# Patient Record
Sex: Female | Born: 1935 | Race: White | Hispanic: No | Smoking: Never smoker
Health system: Southern US, Community
[De-identification: ages and names within clinical notes are randomized; demographics above are authoritative.]

## PROBLEM LIST (undated history)

## (undated) DIAGNOSIS — R569 Unspecified convulsions: Secondary | ICD-10-CM

## (undated) DIAGNOSIS — D369 Benign neoplasm, unspecified site: Secondary | ICD-10-CM

## (undated) DIAGNOSIS — R410 Disorientation, unspecified: Secondary | ICD-10-CM

## (undated) DIAGNOSIS — E119 Type 2 diabetes mellitus without complications: Secondary | ICD-10-CM

## (undated) DIAGNOSIS — J45909 Unspecified asthma, uncomplicated: Secondary | ICD-10-CM

## (undated) DIAGNOSIS — I10 Essential (primary) hypertension: Secondary | ICD-10-CM

## (undated) DIAGNOSIS — R110 Nausea: Secondary | ICD-10-CM

## (undated) DIAGNOSIS — I639 Cerebral infarction, unspecified: Secondary | ICD-10-CM

## (undated) DIAGNOSIS — E785 Hyperlipidemia, unspecified: Secondary | ICD-10-CM

## (undated) DIAGNOSIS — J449 Chronic obstructive pulmonary disease, unspecified: Secondary | ICD-10-CM

## (undated) DIAGNOSIS — K219 Gastro-esophageal reflux disease without esophagitis: Secondary | ICD-10-CM

## (undated) HISTORY — DX: Type 2 diabetes mellitus without complications: E11.9

## (undated) HISTORY — DX: Hyperlipidemia, unspecified: E78.5

## (undated) HISTORY — DX: Unspecified convulsions: R56.9

## (undated) HISTORY — PX: TONSILLECTOMY: SUR1361

## (undated) HISTORY — DX: Cerebral infarction, unspecified: I63.9

## (undated) HISTORY — DX: Chronic obstructive pulmonary disease, unspecified: J44.9

## (undated) HISTORY — DX: Gastro-esophageal reflux disease without esophagitis: K21.9

## (undated) HISTORY — PX: BREAST BIOPSY: SHX20

## (undated) HISTORY — DX: Benign neoplasm, unspecified site: D36.9

---

## 2002-07-30 ENCOUNTER — Ambulatory Visit (HOSPITAL_COMMUNITY): Admission: RE | Admit: 2002-07-30 | Discharge: 2002-07-30 | Payer: Self-pay | Admitting: Internal Medicine

## 2002-07-30 HISTORY — PX: COLONOSCOPY: SHX174

## 2004-11-14 ENCOUNTER — Ambulatory Visit: Payer: Self-pay | Admitting: Urgent Care

## 2005-11-07 ENCOUNTER — Other Ambulatory Visit: Admission: RE | Admit: 2005-11-07 | Discharge: 2005-11-07 | Payer: Self-pay | Admitting: Dermatology

## 2007-06-11 ENCOUNTER — Ambulatory Visit: Payer: Self-pay | Admitting: Cardiology

## 2007-06-18 ENCOUNTER — Encounter: Payer: Self-pay | Admitting: Cardiology

## 2007-06-23 ENCOUNTER — Ambulatory Visit: Payer: Self-pay | Admitting: Cardiology

## 2007-08-21 ENCOUNTER — Ambulatory Visit: Payer: Self-pay | Admitting: Cardiology

## 2007-08-27 ENCOUNTER — Ambulatory Visit: Payer: Self-pay | Admitting: Cardiology

## 2007-08-31 ENCOUNTER — Ambulatory Visit: Payer: Self-pay | Admitting: Internal Medicine

## 2007-08-31 ENCOUNTER — Inpatient Hospital Stay (HOSPITAL_BASED_OUTPATIENT_CLINIC_OR_DEPARTMENT_OTHER): Admission: RE | Admit: 2007-08-31 | Discharge: 2007-08-31 | Payer: Self-pay | Admitting: Internal Medicine

## 2007-09-09 ENCOUNTER — Encounter: Payer: Self-pay | Admitting: Cardiology

## 2007-09-17 ENCOUNTER — Ambulatory Visit: Payer: Self-pay | Admitting: Cardiology

## 2008-05-30 ENCOUNTER — Encounter: Payer: Self-pay | Admitting: Cardiology

## 2008-08-04 ENCOUNTER — Ambulatory Visit: Payer: Self-pay | Admitting: Cardiology

## 2009-05-31 ENCOUNTER — Encounter: Payer: Self-pay | Admitting: Cardiology

## 2009-08-11 DIAGNOSIS — K649 Unspecified hemorrhoids: Secondary | ICD-10-CM | POA: Insufficient documentation

## 2009-08-11 DIAGNOSIS — E785 Hyperlipidemia, unspecified: Secondary | ICD-10-CM

## 2009-08-14 ENCOUNTER — Ambulatory Visit: Payer: Self-pay | Admitting: Cardiology

## 2009-08-14 DIAGNOSIS — I472 Ventricular tachycardia: Secondary | ICD-10-CM

## 2009-08-14 DIAGNOSIS — I10 Essential (primary) hypertension: Secondary | ICD-10-CM | POA: Insufficient documentation

## 2009-08-14 DIAGNOSIS — K219 Gastro-esophageal reflux disease without esophagitis: Secondary | ICD-10-CM | POA: Insufficient documentation

## 2009-08-14 DIAGNOSIS — E119 Type 2 diabetes mellitus without complications: Secondary | ICD-10-CM | POA: Insufficient documentation

## 2009-08-14 DIAGNOSIS — Z8679 Personal history of other diseases of the circulatory system: Secondary | ICD-10-CM | POA: Insufficient documentation

## 2009-08-18 ENCOUNTER — Telehealth (INDEPENDENT_AMBULATORY_CARE_PROVIDER_SITE_OTHER): Payer: Self-pay | Admitting: *Deleted

## 2010-06-13 ENCOUNTER — Encounter: Payer: Self-pay | Admitting: Cardiology

## 2010-10-07 ENCOUNTER — Encounter: Payer: Self-pay | Admitting: Cardiology

## 2010-10-11 ENCOUNTER — Ambulatory Visit: Payer: Self-pay | Admitting: Cardiology

## 2011-01-01 NOTE — Assessment & Plan Note (Signed)
Summary: 1 YR FU   Visit Type:  Follow-up Primary Franklin Baumbach:  Dr. Sherril Croon   History of Present Illness: the patient is a pleasant 77 year old female with no significant history of coronary artery disease. She has normal LV function she does have congenital absence of the right kidney. The patient has significant hypertension. She is on multiple blood pressure medications however the addition of an ARB has not favorably affected her blood pressure. The patient denies any chest pain short of breath orthopnea PND. She does report occasional palpitations still in the evening. Just prior history of nonsustained ventricular tachycardia but in the setting of normal LV function and no significant coronary artery disease. patient's blood pressure medications were increased at the last visit._ Based on blood work in July. Glucose was 144 creatinine was normal. Triglycerides were 243, cholesterol 257 HDL is 34 LDL cholesterol is 174.CBC was within normal limits.vitamin D level was normal at 41.2Lydia Stations hurt legs. Even tried Crest or. Lipitor made legs hurt. Red yeast rice recommended.   Stopped beta blocker changed to amlodipine. Was concerned about COPD. Negative cardiac catherization.  Dry feeling at night.  Ecg wnl  Crest or 2.5 mg will try.  Preventive Screening-Counseling & Management  Alcohol-Tobacco     Smoking Status: never  Current Medications (verified): 1)  Aciphex 20 Mg Tbec (Rabeprazole Sodium) .... Take 1 Tablet By Mouth Once A Day 2)  Diovan Hct 320-12.5 Mg Tabs (Valsartan-Hydrochlorothiazide) .... Take 1 Tablet By Mouth Once A Day 3)  Multivitamins   Tabs (Multiple Vitamin) .... Take 1 Tablet By Mouth Once A Day 4)  Vitamin D 50,000iu .... Take One By Mouth  Every Other Week 5)  Fish Oil Double Strength 1200 Mg Caps (Omega-3 Fatty Acids) .... Take 1 Capsule By Mouth Two Times A Day 6)  Calcium Carbonate-Vitamin D 600-400 Mg-Unit Tabs (Calcium Carbonate-Vitamin D) .... Take 1  Tablet By Mouth Two Times A Day 7)  Metformin Hcl 500 Mg Tabs (Metformin Hcl) .... Take 2 Tablets Every Morning and 1 Tablet Every Evening. 8)  Amlodipine Besylate 10 Mg Tabs (Amlodipine Besylate) .... Take 1 Tablet By Mouth Once A Day 9)  Glimepiride 2 Mg Tabs (Glimepiride) .... Take 1/2-1 Tablet By Mouth Once A Day 10)  Hydromet 5-1.5 Mg/65ml Syrp (Hydrocodone-Homatropine) .... As Needed 11)  Crestor 5 Mg Tabs (Rosuvastatin Calcium) .... Take 1/2 Tab (2.5mg ) At Bedtime  Allergies (verified): No Known Drug Allergies  Comments:  Nurse/Medical Assistant: The patient's medication list and allergies were reviewed with the patient and were updated in the Medication and Allergy Lists.  Past History:  Past Medical History: Last updated: 08/11/2009 Significant for hyperlipidemia, intermittently  bleeding hemorrhoids, history of adenomatous polyp, biopsy of right breast  for benign disease, tonsillectomy.  Past Surgical History: Last updated: 08/11/2009  tonsillectomy.  Family History: Last updated: 08/11/2009 Mother succumbed to congestive heart failure.  Father died  of problems related to COPD.  One brother had alcohol related cirrhosis.  No  history of chronic GI or liver disease otherwise.  Social History: Last updated: 08/11/2009 The patient has been married for 50 years.  Has two  children.  She is retired from [employer].  She denies use of tobacco or alcohol  abuse.  Risk Factors: Smoking Status: never (10/11/2010)  Vital Signs:  Patient profile:   77 year old female Height:      66 inches Weight:      176 pounds BMI:     28.51 Pulse rate:  80 / minute BP sitting:   134 / 74  (left arm) Cuff size:   regular  Vitals Entered By: Carlye Grippe (October 11, 2010 1:35 PM)  Nutrition Counseling: Patient's BMI is greater than 25 and therefore counseled on weight management options.  Physical Exam  Additional Exam:  General: Well-developed, well-nourished in no  distress head: Normocephalic and atraumatic eyes PERRLA/EOMI intact, conjunctiva and lids normal nose: No deformity or lesions mouth normal dentition, normal posterior pharynx neck: Supple, no JVD.  No masses, thyromegaly or abnormal cervical nodes lungs: Normal breath sounds bilaterally without wheezing.  Normal percussion heart: regular rate and rhythm with normal S1 and S2, no S3 or S4.  PMI is normal.  No pathological murmurs abdomen: Normal bowel sounds, abdomen is soft and nontender without masses, organomegaly or hernias noted.  No hepatosplenomegaly musculoskeletal: Back normal, normal gait muscle strength and tone normal pulsus: Pulse is normal in all 4 extremities Extremities: No peripheral pitting edema neurologic: Alert and oriented x 3 skin: Intact without lesions or rashes cervical nodes: No significant adenopathy psychologic: Normal affect    Impression & Recommendations:  Problem # 1:  ESSENTIAL HYPERTENSION, BENIGN (ICD-401.1) blood pressure control. Continue with medical regimen. Beta blocker was changed to amlodipine. The following medications were removed from the medication list:    Diltiazem Hcl Er Beads 300 Mg Xr24h-cap (Diltiazem hcl er beads) .Marland Kitchen... Take one capsule by mouth daily    Cardizem Cd 300 Mg Xr24h-cap (Diltiazem hcl coated beads) .Marland Kitchen... Take 1 capsule by mouth once a day    Tekturna 300 Mg Tabs (Aliskiren fumarate) .Marland Kitchen... Take 1 tablet by mouth once a day Her updated medication list for this problem includes:    Diovan Hct 320-12.5 Mg Tabs (Valsartan-hydrochlorothiazide) .Marland Kitchen... Take 1 tablet by mouth once a day    Amlodipine Besylate 10 Mg Tabs (Amlodipine besylate) .Marland Kitchen... Take 1 tablet by mouth once a day  Orders: EKG w/ Interpretation (93000)  Problem # 2:  HYPERLIPIDEMIA (ICD-272.4) the patient will try to start Crestor a 2.5 mg at bedtime. Her updated medication list for this problem includes:    Crestor 5 Mg Tabs (Rosuvastatin calcium) .Marland Kitchen...  Take 1/2 tab (2.5mg ) at bedtime  Problem # 3:  DM (ICD-250.00) Assessment: Comment Only  Her updated medication list for this problem includes:    Diovan Hct 320-12.5 Mg Tabs (Valsartan-hydrochlorothiazide) .Marland Kitchen... Take 1 tablet by mouth once a day    Metformin Hcl 500 Mg Tabs (Metformin hcl) .Marland Kitchen... Take 2 tablets every morning and 1 tablet every evening.    Glimepiride 2 Mg Tabs (Glimepiride) .Marland Kitchen... Take 1/2-1 tablet by mouth once a day  Patient Instructions: 1)  Crestor 2.5mg  at bedtime 2)  Labs: cholesterol in 6 months, will send note in mail 3)  Follow up in  1 year Prescriptions: CRESTOR 5 MG TABS (ROSUVASTATIN CALCIUM) take 1/2 tab (2.5mg ) at bedtime  #15 x 11   Entered by:   Hoover Brunette, LPN   Authorized by:   Lewayne Bunting, MD, [facility]   Signed by:   Hoover Brunette, LPN on 65/78/4696   Method used:   Electronically to        [facility] [address] (retail)       [address]       [place], [place]  [address]       Ph: [phone] or [phone]       Fax: [phone]   RxID:  [id number]   Handout requested.

## 2011-03-22 ENCOUNTER — Encounter: Payer: Self-pay | Admitting: *Deleted

## 2011-03-22 ENCOUNTER — Other Ambulatory Visit: Payer: Self-pay | Admitting: *Deleted

## 2011-03-22 DIAGNOSIS — E785 Hyperlipidemia, unspecified: Secondary | ICD-10-CM

## 2011-03-28 ENCOUNTER — Encounter: Payer: Self-pay | Admitting: Cardiology

## 2011-04-16 NOTE — Assessment & Plan Note (Signed)
[facility]                          [facility] NOTE   Sarah Duarte, Sarah Duarte                        MRN:          [id number]  DATE:06/11/2007                            DOB:          11-16-1932    Primary care physician previously was Dr. Eliberto Ivory, now is Doreen Beam, MD.  Cardiologist will be Learta Codding, MD.   CHIEF COMPLAINT:  Palpitations.   HISTORY OF PRESENT ILLNESS:  Sarah Duarte is a 77 year old female patient  who was recently evaluated by Dr. Sherril Croon for palpitations.  She has no  prior history of coronary artery disease or arrhythmia.  Over the last  several months she has noticed palpitations mainly at night when she is  lying in bed.  She often hears her heart beat in her ear.  She also  often notes tachypalpitations.  When she has the palpitations, she feels  like her energy is decreased.  She denies any syncope or near-syncope  associated with her symptoms.  She denies any shortness of breath or  chest pain.  She denies any dyspnea on exertion, orthopnea, paroxysmal  nocturnal dyspnea or significant pedal edema.  She denies any exertional  chest heaviness or tightness, arm or jaw discomfort.  The palpitations  never occur with exertion.   PAST MEDICAL HISTORY:  1. Hypertension.  2. Diet-controlled diabetes mellitus.  3. Hyperlipidemia.      a.     She has been intolerant to multiple statins in the past.  4. Gastroesophageal reflux disease.  5. Degenerative joint disease.  6. Status post benign right breast lumpectomy x3.   MEDICATIONS:  1. Aciphex 20 mg daily.  2. Atenolol 50 mg half a tablet daily.  3. Norvasc 5 mg daily.  4. Citalopram daily.  5. Multivitamin daily.  6. Fish oil 1 g daily.  7. Calcium.  8. Vitamin D.  9. Neurontin p.r.n.  10.Hydromet p.r.n.  11.Darvocet p.r.n.   ALLERGIES:  No known drug allergies.  She does note that Pravachol  caused myalgias and Lipitor caused visual changes.   SOCIAL HISTORY:   She denies any history of tobacco use in the past.  Denies alcohol abuse.  She is married and has 2 children.  She is  retired from [employer].   Family history is insignificant for premature coronary artery disease.  Her mother died in her 66s of congestive heart failure.  She also had a  sister die about a week ago from congestive heart failure.  She thinks  both may have had myocardial infarctions in their 10s.  Her father did  have a history of stroke.   REVIEW OF SYSTEMS:  Please see HPI.  She does have a chronic cough times  several years with clear sputum.  She denies any hemoptysis.  She denies  any fevers, chills, melena, hematochezia, hematuria or dysuria.  Denies  any facial drooping, difficulty with speech, monocular blindness, or  unilateral weakness.  She denies any symptoms consistent with  claudication but she does describe symptoms that sound concerning for  restless legs syndrome.  The rest of the review  of systems is negative.   PHYSICAL EXAMINATION:  She is a well-developed, well-nourished female in  no distress.  Blood pressure 123/64, pulse 53, weight 168 pounds.  HEENT is normal.  NECK:  Without JVD, no lymphadenopathy.  Carotid without bruits  bilaterally.  ENDOCRINE:  Without thyromegaly.  CARDIAC:  Normal S1, S2.  Regular rate and rhythm without murmurs.  Lungs are clear to auscultation bilaterally without wheezes, rhonchi or  rales.  ABDOMEN:  Soft, nontender, nondistended, with normoactive bowel sounds.  No organomegaly.  EXTREMITIES:  Without edema.  Calves soft, nontender.  Skin is warm and dry.  NEUROLOGIC:  She is alert and oriented x3.  Cranial nerves II-XII are  grossly intact.   Electrocardiogram reveals sinus bradycardia with a heart rate of 58.  No  acute changes.  QTC 414 msec.   DATA BASE:  1. Echocardiogram performed at Dr. Bonnita Levan office is pending.  2. CardioNet event monitor:  The patient had two episodes of      nonsustained ventricular  tachycardia noted.  One was 6 beats and      one was 7 beats.  Apparently the patient was told by Dr. Bonnita Levan      office to go to the emergency room but she refused.   IMPRESSION:  1. Palpitations.  2. Nonsustained ventricular tachycardia.  3. Hypertension.  4. Untreated hyperlipidemia.      a.     Intolerant to multiple statins in the past.  5. Diet-controlled diabetes mellitus.  6. Gastroesophageal reflux disease.  7. Degenerative joint disease.  8. Remote family history of coronary artery disease.   PLAN:  The patient presents to the office today with complaints of  palpitations.  She thinks that they may have gotten a little bit worse  at night time.  She does have evidence of nonsustained ventricular  tachycardia by her CardioNet event monitor.  She denies any syncope or  near-syncope.  She denies any chest pain.  She does admit to quite a bit  of caffeine intake with drinking 2-3 bottles of green tea daily.  I have  asked her to cut back on her green tea consumption.  We will also try to  obtain the results of the 2 D echocardiogram from Dr. Bonnita Levan office.  We  will also set her up for a BMET and magnesium level.  Will also set her  up for a stress Cardiolite study to rule out the possibility of exercise-  induced ventricular tachycardia as well as to rule out ischemic heart  disease as a cause for her nonsustained ventricular tachycardia.  Her  monitor will be finished out and we will try to obtain the results from  Dr. Bonnita Levan office once that is complete.  I will have her follow up with  Dr. Andee Lineman in the next 2-3 weeks after the above testing is completed.  No medication changes were made today.      Tereso Newcomer, PA-C  Electronically Signed      Learta Codding, MD,FACC  Electronically Signed   SW/MedQ  DD: 06/11/2007  DT: 06/12/2007  Job #: [id number]   cc:   Doreen Beam

## 2011-04-16 NOTE — Assessment & Plan Note (Signed)
[facility]                          [facility] NOTE   Sarah Duarte, Sarah Duarte                        MRN:          [id number]  DATE:08/27/2007                            DOB:          Feb 06, 1932    HISTORY OF PRESENT ILLNESS:  Patient is a 77 year old female with a  history of palpitations.  The patient has worn at CardioNet monitor and  has been diagnosed with frequent episodes of 7-8 beats of nonsustained  ventricular tachycardia.  She has associated palpitations.  Patient was  ruled out for ischemic heart disease.  A stress test was ordered without  any evidence of exercise-induced ventricular arrhythmias.  There were no  perfusion abnormalities, and the ejection fraction was 73%.  Of note,  however, was that the patient had very poor exercise tolerance and was  not able to achieve 5 mets.  Echocardiographic study also does not  demonstrate structural abnormalities.  She does have mild left  ventricular hypertrophy as well as septal hypertrophy but no definite  outflow gradient.  Patient has been maintained on low dose atenolol.   Patient now presents for followup.  She has seen Scott in the office on  August 21, 2007.   MEDICATIONS:  1. Effexor 20 mg a day.  2. Atenolol 25 mg p.o. daily.  3. Citalopram.  4. Multivitamin.  5. Fish oil.  6. Vitamin D.  7. Garlic tablet.  8. Norvasc 10 mg p.o. daily.   PHYSICAL EXAMINATION:  VITAL SIGNS:  Blood pressure 143/74, heart rate  60.  She weighs 167 pounds.  NECK:  Normal carotid upstrokes.  No carotid bruits.  LUNGS:  Clear breath sounds bilaterally.  HEART:  Regular rate and rhythm.  Normal S1 and S2.  No murmurs, gallops  or rubs.  ABDOMEN:  Soft, nontender.  No rebound or guarding.  Good bowel sounds.  EXTREMITIES:  No clubbing, cyanosis or edema.   PROBLEMS:  1. Palpitations.  2. Nonsustained ventricular tachycardia.  3. Nonischemic stress Cardiolite study in July, 2008.  4.  Normal left ventricular function by an __________  study and by      echocardiogram in July, 2008.  5. Treated dyslipidemia, intolerant to multiple statins in the past.  6. Diet-controlled diabetes mellitus.  7. Gastroesophageal reflux disease.  8. Degenerative joint disease.  9. __________  coronary artery disease.  10.Fatigue.   PLAN:  1. Patient does not appear to have structural heart disease.      __________ .  She has ischemic heart disease.  However, she      continues to have symptomatic nonsustained ventricular tachycardia      and will proceed with a cardiac catheterization to definitely rule      out CAD.  2. I have increased the patient's atenolol 50 mg p.o. daily.  3. If the patient's catheterization is within normal limits and      patient continues to have symptomatic longstanding VT, we will      refer her to our electrophysiologist for a second opinion.     Learta Codding, MD,FACC  Electronically Signed  GED/MedQ  DD: 08/27/2007  DT: 08/28/2007  Job #: [id number]   cc:   Doreen Beam

## 2011-04-16 NOTE — Assessment & Plan Note (Signed)
[facility]                          [facility]   Sarah Duarte, Sarah Duarte                        MRN:          [id number]  DATE:08/04/2008                            DOB:          November 16, 1932    HISTORY OF PRESENT ILLNESS:  The patient is a 77 year old female with  history of palpitations, which have greatly improved on calcium-channel  blocker therapy.  She has no evidence of ischemic heart disease.  As a  matter of fact, cardiac catheterization last year was completely within  normal limits.  The patient has normal LV function.  She states that she  is doing well from a cardiac perspective, although she does complain of  generalized fatigue and weakness.  She has no chest pain, orthopnea,  PND, palpitations, or syncope.   MEDICATIONS:  1. Aciphex 20 mg p.o. daily.  2. Citalopram 10 mg p.o. daily.  3. Multivitamin.  4. Fish oil.  5. Calcium tablets.  6. Vitamin D5.  7. Diovan/hydrochlorothiazide 320/12.5 mg daily.  8. Diltiazem ER 240 mg q. p.m.  9. Metformin 500 mg q. a.m.   PHYSICAL EXAMINATION:  VITAL SIGNS:  Blood pressure is 150/69, heart  rate 64, and weight is 176 pounds.  NECK:  Normal carotid upstrokes.  No carotid bruits.  LUNGS:  Clear breath sounds bilaterally.  HEART:  Regular rate and rhythm.  Normal S1 and S2.  No murmurs, rubs,  or gallops.  ABDOMEN:  Soft, nontender.  No rebound or guarding.  Good bowel sounds.  EXTREMITIES:  No cyanosis, clubbing, or edema.   PROBLEM LIST:  1. Palpitations, resolved.  2. Nonsustained ventricular tachycardia.  3. __________.  4. Significant coronary artery disease by catheterization in 2008.  5. Normal left ventricular function by echocardiogram.  6. Gastroesophageal reflux disease.  7. Diabetes mellitus.  8. Dyslipidemia.  9. Fatigue.   PLAN:  1. From a cardiovascular standpoint, the patient is doing quite well.      She has no chest pain, shortness of breath, orthopnea,  or PND.  She      is essentially asymptomatic from a cardiac perspective.  I have      told her that her general fatigue is likely related to other      issues, and      she is going to discuss with her primary care physician.  2. The patient will have her Cardizem CD increased from 240 to 300 mg      daily because of persistent hypertension.     Learta Codding, MD,FACC  Electronically Signed    GED/MedQ  DD: 08/04/2008  DT: 08/05/2008  Job #: [id number]

## 2011-04-16 NOTE — Cardiovascular Report (Signed)
NAMEHIILANI, JETTER               ACCOUNT NO.:  [id number]   MEDICAL RECORD NO.:  [id number]          PATIENT TYPE:  OIB   LOCATION:  1961                         FACILITY:  [facility]   PHYSICIAN:  Bevelyn Buckles. Bensimhon, MDDATE OF BIRTH:  1932-04-19   DATE OF PROCEDURE:  08/31/2007  DATE OF DISCHARGE:                            CARDIAC CATHETERIZATION   PRIMARY CARE PHYSICIAN:  Dr. Doreen Beam in [place].   CARDIOLOGIST:  Dr Lewayne Bunting.   PATIENT IDENTIFICATION:  Ms. Rupard is a delightful 78 year old woman  with multiple cardiac risk factors, including hypertension,  hyperlipidemia, and diabetes.  She has recently been having  palpitations, and a monitor revealed nonsustained ventricular  tachycardia, several episodes.  She underwent an echocardiogram and a  Myoview which were normal.  However, she has had ongoing palpitations,  given her symptoms and nonsustained ventricular tachycardia.  She was  referred for cardiac catheterization in the outpatient catheterization  laboratory.   PROCEDURES PERFORMED:  1. Selective coronary angiography.  2. Left heart catheterization.  3. Left ventriculogram.  4. Abdominal aortogram.   DESCRIPTION OF PROCEDURE:  The risks and indications of the procedure  were explained.  Consent was signed and placed on the chart.  A 5-French  arterial sheath was placed in the right femoral artery using a modified  Seldinger technique.  Standard catheters were used, including a JL-4, JR-  4, and angled pigtail.  Given her significant systemic hypertension at  the start of the case, we decided also to perform an abdominal aortogram  which was done with a pigtail catheter.  All catheter exchanges were  made over a wire.  There are no apparent complications.  Central aortic  pressure is 156/54, with a mean of 90.  LV pressure is 158/1, with an  EDP of 12.  There is no aortic stenosis.   Left main was normal.   LAD was a long vessel, wrapping the apex.  It gave off  a large first  diagonal and a small second diagonal.  It was angiographically normal.   Left circumflex gave off a small ramus branch and a large OM1, as well  as an atrial branch.  It was angiographically normal.   Right coronary artery was a dominant vessel and gave off a small PDA and  four small posterolaterals.  It was angiographically normal.   Left ventriculogram done in the RAO position showed an EF of 60%, with  no wall motion abnormalities and no mitral regurgitation.   Abdominal aortogram showed mild distal plaquing of the abdominal aorta,  without any aneurysm.  The left renal was widely patent.  There was  evidence of congenital absence of the right kidney.   ASSESSMENT:  1. Normal coronary arteries.  2. Normal left ventricular function.  3. New finding of congenital absence of the right kidney.   PLAN:  Continue medical therapy, with aggressive risk factor reduction,  particularly with attention to her blood pressure, given her solitary  kidney.  I have discussed this finding with Dr. Andee Lineman.  We will also  plan to get a renal ultrasound to confirm.  Bevelyn Buckles. Bensimhon, MD  Electronically Signed     DRB/MEDQ  D:  08/31/2007  T:  08/31/2007  Job:  [id number]   cc:   Learta Codding, MD,FACC  Dhruv Sherril Croon

## 2011-04-16 NOTE — Assessment & Plan Note (Signed)
[facility]                          [facility] NOTE   Sarah Duarte, Sarah Duarte                        MRN:          [id number]  DATE:08/21/2007                            DOB:          May 07, 1932    CARDIOLOGIST:  Dr. Lewayne Bunting.   PRIMARY CARE PHYSICIAN:  Dr. Doreen Beam.   REASON FOR APPOINTMENT:  Followup visit.   HISTORY OF PRESENT ILLNESS:  Sarah Duarte is a 77 year old female patient  who returns to the office today for followup.  I initially saw her June 11, 2007 secondary to palpitations and 7 beats of nonsustained  ventricular tachycardia on a CardioNet event monitor, ordered by Dr.  Sherril Croon.  We ordered a stress test on Sarah Duarte to rule out ischemic heart  disease, as well as exercise-induced ventricular arrhythmias.  This  essentially returned normal with an EF of 73% and no ischemia.  She did  have poor exercise tolerance and was not able to achieve 5 METS.  Her  lab work was done, which revealed a creatinine of 0.9, potassium 4.1,  magnesium 2, TSH 1.14, normal CBC.  Unfortunately, we have not yet  received the rest of the strips from her CardioNet monitor from Dr.  Bonnita Levan office.  We have also not received her echocardiogram results  yet.   In the office today, the patient notes that she is doing well.  She  denies chest pain, shortness of breath, syncope, near-syncope,  orthopnea, PND, or pedal edema.  She continues to have palpitations.  These are usually in the morning times.  She denies that these awaken  her from sleep.  She does have some episodes of fatigue that remind her  of what she felt like when she was anemic as a child.   CURRENT MEDICATIONS:  1. AcipHex 20 mg daily.  2. Atenolol 50 mg 1/2 tablet daily.  3. Norvasc 5 mg daily.  4. Citalopram.  5. Multivitamin.  6. Fish oil.  7. Calcium.  8. Vitamin D.  9. Garlic.   PHYSICAL EXAM:  She is a well-nourished, well-developed female in no  acute distress.  Blood  pressure 149/71, pulse 59, weight 106.4 pounds.  HEENT:  Normal.  NECK:  Without JVD.  CARDIAC:  S1, S2.  Regular rate and rhythm.  LUNGS:  Clear to auscultation bilaterally.  ABDOMEN:  Soft and nontender.  EXTREMITIES:  Without edema.  Calves soft and nontender.  SKIN:  Warm and dry.  NEUROLOGIC:  She is alert and oriented x3.  Cranial nerves 2-12 are  grossly intact.   IMPRESSION:  1. Palpitations.  2. Nonsustained ventricular tachycardia.  3. Nonischemic stress Cardiolite study July 2008.  4. Good left ventricular function by nuclear study July 2008.  5. Untreated dyslipidemia.      a.     Intolerant to multiple Statins in the past.  6. Diet-controlled diabetes mellitus.  7. Gastroesophageal reflux disease.  8. Degenerative joint disease.  9. Remote family history of coronary artery disease.  10.Fatigue.   PLAN:  The patient presents to the office today for followup.  Overall,  she is doing well.  No further cardiovascular testing is warranted at  this time.  We will try to obtain the echo and monitor results from Dr.  Bonnita Levan office.  Our plan at this time is to:  1. Increase her Norvasc to 10 mg a day.  2. Obtain echo and monitor results from Dr. Bonnita Levan office.  3. Check a CBC secondary to her complaints of fatigue.  4. Return to the clinic in 6 months for followup.  She should return      sooner if she has any changes in her palpitations or worsening      symptoms.  Of note, she has sinus bradycardia and is fairly      asymptomatic from this.  Given her bradycardia, she has no room to      up-titrate her beta blocker at this point in time.      Tereso Newcomer, PA-C  Electronically Signed      Learta Codding, MD,FACC  Electronically Signed   SW/MedQ  DD: 08/21/2007  DT: 08/21/2007  Job #: [id number]   cc:   Doreen Beam

## 2011-04-16 NOTE — Assessment & Plan Note (Signed)
[facility]                          [facility] NOTE   Sarah Duarte, Sarah Duarte                        MRN:          [id number]  DATE:09/17/2007                            DOB:          04/21/1932    REFERRING PHYSICIAN:  Dr. Sherril Croon.   HISTORY OF PRESENT ILLNESS:  The patient is a 77 year old female with a  history of palpitations. She has worn a CardioNet monitor in the past  and has been diagnosed with frequent episodes of 7 to 8 beats of non-  sustained ventricular tachycardia. The patient was placed on beta-  blocker therapy, but has not experienced any change in her symptoms. As  part of the evaluation, we proceeded a catheterization to make sure the  patient did not have an underlying ischemic heart disease.  Catheterization was essentially normal and the patient had normal LV  function. She was incidentally found to have a single kidney. This was  confirmed by ultrasound ordered by Dr. Sherril Croon. Her chest x-ray also  revealed mild chronic obstructive pulmonary disease.   The patient says that overall she is doing well. She has no shortness of  breath, orthopnea, PND, or chest pain. She does report several occasions  of palpitations.   MEDICATIONS:  1. Aciphex 20 mg daily.  2. Citalopram 20 mg daily.  3. Multivitamin daily.  4. Fish oil 1000 mg daily.  5. Calcium b.i.d.  6. Vitamin D.  7. Garlic tablets 9528 mg b.i.d.  8. Norvasc 10 mg daily.  9. Atenolol 50 mg daily.   PHYSICAL EXAMINATION:  VITAL SIGNS:  Blood pressure 117/59, heart rate  50 beats-per-minute, weight 169 pounds.  NECK:  Normal carotid upstrokes. No carotid bruits.  LUNGS:  Clear breath sounds bilaterally.  HEART:  Regular rate and rhythm with normal S1 and S2. No murmurs, rubs,  or gallops.  ABDOMEN:  Soft and nontender. No rebound or guarding. Good bowel sounds.  EXTREMITIES:  No cyanosis, clubbing, or edema.  NEUROLOGIC:  The patient is alert, oriented, and  grossly non-focal.   PROBLEM LIST:  1. Palpitations.  2. Non-sustained ventricular tachycardia.  3. Non-ischemic Cardiolite study.  4. No significant coronary artery disease by catheterization.  5. Normal LV function by echo in July 2008.  6. Gastroesophageal reflux disease.  7. Diet controlled diabetes mellitus.  8. Treated dyslipidemia.  9. Fatigue.   PLAN:  1. At this point, we will stop the patient's Norvasc and we will try a      calcium channel blocker to see if this would help with the      patient's palpitations.  2. We will discontinue Atenolol.  3. At this point, the patient also does not need any further workup as      it appears that she has no significant underlying structural heart      disease. I did report to her also that her chest x-ray is abnormal      and I am also suspicious that she may well have significant      esophageal reflux disease. I asked her to double up her  Aciphex to      20 mg b.i.d.     Learta Codding, MD,FACC  Electronically Signed    GED/MedQ  DD: 09/18/2007  DT: 09/19/2007  Job #: [id number]   cc:   Doreen Beam

## 2011-04-19 NOTE — Op Note (Signed)
NAMEPARISSA, [name]                         ACCOUNT NO.:  [id number]   MEDICAL RECORD NO.:  [id number]                   PATIENT TYPE:  AMB   LOCATION:  DAY                                  FACILITY:  [facility]   PHYSICIAN:  Gerrit Friends. Rourk, M.D.               DATE OF BIRTH:  November 08, 1932   DATE OF PROCEDURE:  07/30/2002  DATE OF DISCHARGE:                                 OPERATIVE REPORT   PROCEDURE:  Screening colonoscopy.   ENDOSCOPIST:  Gerrit Friends. Rourk, M.D.   INDICATIONS FOR PROCEDURE:  The patient is a 77 year old lady with a history  of colonic adenomatous polyps.  She had what was at least an adenoma in her  left colon, cold biopsied at sigmoidoscopy previously.  She had a full  colonoscopy back in 2000.  This polyp was resected, but it was not  recovered.  She has not had any lower GI tract symptoms.  Colonoscopy is now  being done as a surveillance maneuver.  This approach has been discussed  with the patient; the potential risks, benefits, and alternatives have been  reviewed; questions answered.  I feel that she is low risk for conscious  sedation in the way of Versed and Demerol.  Please see my dictated H&P for  more information.   DESCRIPTION OF PROCEDURE:  O2 saturation, blood pressure, pulse and  respirations were monitored throughout the entire procedure.   CONSCIOUS SEDATION:  Versed 3 mg IV, Demerol 50 mg IV in divided doses.   INSTRUMENT:  Olympus video chip colonoscope.   FINDINGS:  Digital rectal exam revealed no abnormalities.   ENDOSCOPIC FINDINGS:  The prep was good.   RECTUM:  Examination of the rectal mucosa including the retroflex view of  the anal verge revealed no abnormalities.   COLON:  The colonic mucosa was surveyed from the rectosigmoid junction  through the left transverse and right colon to the area of the appendiceal  orifice, ileocecal valve, and cecum.  These structures were well seen and  photographed for the record.  The patient was  noted to have left-sided  transverse diverticulum; and the remainder of the colonic mucosa appeared  normal.   From the level of the cecum and ileocecal valve the scope was slowly  withdrawn.  All previously mentioned mucosal surfaces were again seen; and,  again, no other abnormalities were observed.  The patient tolerated the  procedure well and was reacted in endoscopy.   IMPRESSION:  1. Normal rectum.  2. Transverse left-sided diverticulum.  3. The remainder of the colonic mucosa appeared normal.   RECOMMENDATIONS:  1. Repeat colonoscopy in 5 years.  2. Diverticulosis literature provided to the patient.  Gerrit Friends. Rourk, M.D.    RMR/MEDQ  D:  07/30/2002  T:  07/31/2002  Job:  [id number]   cc:   Wyvonnia Lora

## 2011-04-19 NOTE — H&P (Signed)
[name], [name]                           ACCOUNT NO.:  [id number]   MEDICAL RECORD NO.:  [id number]                   PATIENT TYPE:   LOCATION:                                       FACILITY:   PHYSICIAN:  Gerrit Friends. Rourk, M.D.               DATE OF BIRTH:  05-26-1932   DATE OF ADMISSION:  07/05/2002  DATE OF DISCHARGE:                                HISTORY & PHYSICAL   CHIEF COMPLAINT:  History of adenomatous polyp removed from colon in 2000.   HISTORY OF PRESENT ILLNESS:  [name] is a pleasant 77 year old  lady who underwent a sigmoidoscopy which led to the discovery of an  adenomatous polyp in the left colon (biopsy proven) back in 2000.  She  ultimately underwent colonoscopy.  This polyp was resected.  However, it was  lost in the recovery process.  The pathology on the polyp was not known  entirely although it was cold biopsied at time of sigmoidoscopy which  revealed adenomatous tissue.  She has not had any rectal bleeding, change in  bowel habits recently.   She has longstanding GERD and laryngopharyngeal reflux for which she has  seen Dr. Ferd Glassing and Dr. Dion Saucier in [place], [place].  She has been on  Aciphex 20 mg orally b.i.d. which seems to be controlling her symptoms very  well.  She has had some visual problems which the pharmacist told her could  be secondary to Aciphex.  Nexium did not seem to work as well for this nice  lady.  She requested her liver labs and cholesterol checked today.   PAST MEDICAL HISTORY:  Significant for hyperlipidemia, intermittently  bleeding hemorrhoids, history of adenomatous polyp, biopsy of right breast  for benign disease, tonsillectomy.   CURRENT MEDICATIONS:  1. Ambien 10 mg at bedtime p.r.n.  2. Claritin 10 mg daily p.r.n.  3. Lipitor 20 mg daily.  4. Aciphex 20 mg orally b.i.d.   ALLERGIES:  No known drug allergies.   FAMILY HISTORY:  Mother succumbed to congestive heart failure.  Father died  of  problems related to COPD.  One brother had alcohol related cirrhosis.  No  history of chronic GI or liver disease otherwise.   SOCIAL HISTORY:  The patient has been married for 50 years.  Has two  children.  She is retired from [employer].  She denies use of tobacco or alcohol  abuse.   REVIEW OF SYSTEMS:  She denies chest pain, dyspnea on exertion, no fever or  chills.   PHYSICAL EXAMINATION:  GENERAL:  The patient is a pleasant 77 year old lady  resting comfortably.  VITAL SIGNS:  Blood pressure 160/60, pulse 78.  SKIN:  Warm and dry.  There is no jaundice.  No stigmata of GI or chronic  liver disease.  CHEST:  Lungs are clear to auscultation.  HEART:  Regular rate and rhythm without murmurs, rubs or gallops.  ABDOMEN:  Nondistended, positive bowel sounds, soft, nontender, no  appreciable mass, organomegaly.  RECTAL:  Deferred to the time of colonoscopy.   IMPRESSION:  The patient is a 77 year old lady with a history of adenomatous  polyp removed from her left colon.  It was previously cold biopsied which  revealed adenomatous tissue.  The entire polyp was not evaluated by  pathologist as it was lost in the recovery process.  In this setting she  presents for colonoscopy now to re-evaluate her entire colon.  This will be  surveillance colonoscopy.  Questions were answered.  She is agreeable to  this.  She is low risk for conscious sedation.  Of note she received Versed  2 mg IV, Demerol 25 mg IV for prior colonoscopy at [facility] and did  very well with that regimen.   She has some visual disturbances and the pharmacist has raised the question  of Aciphex related process.  Not mentioned above she did see Dr. Chales Abrahams down  in [place] who did not have any apparently strong feelings about  medication related phenomena and she was told that she had clinical dry  eyes.   Visual disturbance related to Aciphex must be a very unusual phenomenon.   RECOMMENDATIONS:  Will go ahead  and just ask her to hold Aciphex, will give  her a 30 day supply of Protonix samples 40 mg once a day 30 minutes before  breakfast.  Will plan for colonoscopy in the near future.  At her request  will go ahead and check a liver profile.  She has not fasted today.  Will  ask her to hold off getting a lipid profile.  She is to return for that lab  assay when she is in a fasting state.  Further recommendations to follow.                                                Gerrit Friends. Rourk, M.D.    RMR/MEDQ  D:  07/05/2002  T:  07/05/2002  Job:  [id number]   cc:   Knox Saliva, M.D.

## 2011-08-26 ENCOUNTER — Encounter: Payer: Self-pay | Admitting: Cardiovascular Disease

## 2011-11-12 ENCOUNTER — Encounter: Payer: Self-pay | Admitting: *Deleted

## 2011-11-15 ENCOUNTER — Ambulatory Visit (INDEPENDENT_AMBULATORY_CARE_PROVIDER_SITE_OTHER): Payer: Medicare Other | Admitting: Cardiology

## 2011-11-15 ENCOUNTER — Encounter: Payer: Self-pay | Admitting: Cardiology

## 2011-11-15 VITALS — BP 126/71 | HR 65 | Ht 66.0 in | Wt 172.0 lb

## 2011-11-15 DIAGNOSIS — I10 Essential (primary) hypertension: Secondary | ICD-10-CM

## 2011-11-15 DIAGNOSIS — Z8679 Personal history of other diseases of the circulatory system: Secondary | ICD-10-CM

## 2011-11-15 DIAGNOSIS — I472 Ventricular tachycardia, unspecified: Secondary | ICD-10-CM

## 2011-11-15 DIAGNOSIS — E785 Hyperlipidemia, unspecified: Secondary | ICD-10-CM

## 2011-11-15 NOTE — Patient Instructions (Signed)
Continue all current medications. Call back after first of year to inquire about new monitor (pending insurance) Your physician wants you to follow up in: 6 months.  You will receive a reminder letter in the mail one-two months in advance.  If you don't receive a letter, please call our office to schedule the follow up appointment

## 2011-11-16 NOTE — Assessment & Plan Note (Signed)
Hypertriglyceridemia but LDL is remarkably low at 37. The patient also has no significant coronary artery disease. No further adjustment of her Crestor as needed

## 2011-11-16 NOTE — Assessment & Plan Note (Signed)
This will be further reviewed with an event monitor but the patient has normal LV function and no coronary artery disease. We'll defer from giving her a beta blocker given her COPD and a clinical history of occasional wheezing.

## 2011-11-16 NOTE — Assessment & Plan Note (Signed)
I told the patient that we should further evaluate her for her palpitations. We will apply an event monitor. This can be done after the holidays. The main reason to further evaluate this to see if she has atrial fibrillation, because the patient has multiple risk factors with high risk for thromboembolic disease including female sex, age greater than 79 diabetes mellitus and hypertension. The patient is in agreement to further proceed with testing.

## 2011-11-16 NOTE — Progress Notes (Signed)
Sarah Bottoms, MD, [facility] Board Certified in Adult Cardiovascular Medicine,Internal Medicine and Critical Care Medicine    CC: Routine followup patient with history of nonsustained ventricular tachycardia and normal LV function  HPI:  The patient is a 77 year old female with no prior history of coronary artery disease. She has congenital absence of the right kidney. She has had problems with hypertension in the past. She recently had [facility] screening done. Apparently she had no aortic aneurysm or significant carotid artery disease. Her triglycerides were elevated at 369 with an HDL cholesterol of 40 and an LDL cholesterol 37. From a cardiac standpoint the patient reports no chest pain. She assured shortness of breath on exertion with wheezing which appears to be related to her COPD. Her main cardiovascular complaints his occasional palpitations with a sensation of jitteriness which occurs frequently at night. She does not report symptoms during the daytime. She has normal exercise tolerance and feels no palpitations upon activity. During the nighttime she does not appear to be panicky or anxious and there are no associated symptoms of diaphoresis or shortness of breath. The patient states that this happens a couple of times a week and last usually for a few minutes.     PMH: reviewed and listed in Problem List in Electronic Records (and see below) Past Medical History  Diagnosis Date  . Hyperlipidemia   . Hemorrhoids     intermittently,bleeding  . Adenomatous polyp     h/o   Past Surgical History  Procedure Date  . Breast biopsy     right,for benign disease  . Tonsillectomy   Status post cardiac catheterization with normal coronary arteries in 2007., Normal LV function    Allergies/SH/FHX : available in Electronic Records for review  Medications: Current Outpatient Prescriptions  Medication Sig Dispense Refill  . amLODipine (NORVASC) 10 MG tablet Take 10 mg by mouth  daily.        . calcium carbonate (OS-CAL) 600 MG TABS Take 600 mg by mouth 2 (two) times daily with a meal.        . ergocalciferol (VITAMIN D2) 50000 UNITS capsule Take 50,000 Units by mouth once a week.       Marland Kitchen glimepiride (AMARYL) 2 MG tablet Take 1 mg by mouth daily before breakfast.       . HYDROcodone-homatropine (HYCODAN) 5-1.5 MG/5ML syrup Take 5 mLs by mouth every 6 (six) hours as needed.        . metFORMIN (GLUCOPHAGE) 500 MG tablet Take 1,000 mg by mouth 2 (two) times daily with a meal.       . Multiple Vitamin (MULTIVITAMIN) tablet Take 1 tablet by mouth daily.        . Omega-3 Fatty Acids (FISH OIL) 1200 MG CAPS Take 1 capsule by mouth 2 (two) times daily.        . RABEprazole (ACIPHEX) 20 MG tablet Take 20 mg by mouth daily.        . rosuvastatin (CRESTOR) 5 MG tablet Take 2.5 mg by mouth daily.        . valsartan-hydrochlorothiazide (DIOVAN-HCT) 320-12.5 MG per tablet Take 1 tablet by mouth daily.          ROS: No nausea or vomiting. No fever or chills.No melena or hematochezia.No bleeding.No claudication.  Physical Exam: BP 126/71  Pulse 65  Ht 5\' 6"  (1.676 m)  Wt 172 lb (78.019 kg)  BMI 27.76 kg/m2 General: Well-nourished white female in no apparent distress Neck: Normal carotid upstroke  no carotid bruit. No thyromegaly nonnodular thyroid. JVD is approximately 6 cm Lungs: Clear breath sounds bilaterally no wheezing Cardiac: Regular rate and rhythm with normal S1-S2 and no murmur rubs or gallops. Vascular: No edema. Normal distal pulses. Skin: Warm and dry  12lead ECG: Not available Limited bedside ECHO:N/A Laboratory data from [facility] were reviewed and outlined above.   Assessment and Plan

## 2011-11-16 NOTE — Assessment & Plan Note (Signed)
Blood pressure appears to be now well controlled on medical regimen of a calcium channel blocker, ARB and diuretic. I have made no changes.

## 2012-06-02 ENCOUNTER — Ambulatory Visit (INDEPENDENT_AMBULATORY_CARE_PROVIDER_SITE_OTHER): Payer: Medicare Other | Admitting: Cardiology

## 2012-06-02 ENCOUNTER — Encounter: Payer: Self-pay | Admitting: Cardiology

## 2012-06-02 VITALS — BP 137/76 | HR 71 | Ht 66.0 in | Wt 166.0 lb

## 2012-06-02 DIAGNOSIS — R05 Cough: Secondary | ICD-10-CM

## 2012-06-02 DIAGNOSIS — I10 Essential (primary) hypertension: Secondary | ICD-10-CM

## 2012-06-02 DIAGNOSIS — I472 Ventricular tachycardia: Secondary | ICD-10-CM

## 2012-06-02 DIAGNOSIS — R053 Chronic cough: Secondary | ICD-10-CM | POA: Insufficient documentation

## 2012-06-02 DIAGNOSIS — Z8679 Personal history of other diseases of the circulatory system: Secondary | ICD-10-CM

## 2012-06-02 NOTE — Assessment & Plan Note (Signed)
No recurrent palpitations. No documented arrhythmias. The patient is stable

## 2012-06-02 NOTE — Progress Notes (Signed)
Sarah Bottoms, MD, [facility] Board Certified in Adult Cardiovascular Medicine,Internal Medicine and Critical Care Medicine    CC:     History of palpitations                                                                             HPI:        The patient has history of palpitations. For the most part these have resolved. Her main complaint is a chronic cough which has been present since 1976. She states recently has been worsening. I recommended for her to go see the ENT doctor. The patient still fatigued and the lack of energy most days of the week. She denies however any cardiac complaints in particular chest pain orthopnea or PND  PMH: reviewed and listed in Problem List in Electronic Records (and see below) Past Medical History  Diagnosis Date  . Hyperlipidemia   . Hemorrhoids     intermittently,bleeding  . Adenomatous polyp     h/o   Past Surgical History  Procedure Date  . Breast biopsy     right,for benign disease  . Tonsillectomy     Allergies/SH/FHX : available in Electronic Records for review  Allergies  Allergen Reactions  . Aspirin     Due to asthma   . Reglan (Metoclopramide) Other (See Comments)    Panic attacks   History   Social History  . Marital Status: Married    Spouse Name: N/A    Number of Children: N/A  . Years of Education: N/A   Occupational History  . Not on file.   Social History Main Topics  . Smoking status: Never Smoker   . Smokeless tobacco: Never Used  . Alcohol Use: No  . Drug Use: No  . Sexually Active: Not on file   Other Topics Concern  . Not on file   Social History Narrative   Has 2 children and has been married for 50 years. Retired from [employer].   Family History  Problem Relation Age of Onset  . Heart failure Mother   . COPD Father   . Cirrhosis Brother     alcohol related cirrhosis    Medications: Current Outpatient Prescriptions  Medication Sig Dispense Refill  . amLODipine (NORVASC) 10 MG tablet Take  10 mg by mouth daily.        . Calcium Carbonate-Vitamin D (CALTRATE 600+D) 600-400 MG-UNIT per tablet Take 1 tablet by mouth 2 (two) times daily.      . ergocalciferol (VITAMIN D2) 50000 UNITS capsule Take 50,000 Units by mouth every 14 (fourteen) days.       Marland Kitchen glimepiride (AMARYL) 2 MG tablet Take 1 mg by mouth daily before breakfast.       . metFORMIN (GLUCOPHAGE) 500 MG tablet Take 1,000 mg by mouth 2 (two) times daily with a meal.       . Multiple Vitamin (MULTIVITAMIN) tablet Take 1 tablet by mouth daily.        . Omega-3 Fatty Acids (FISH OIL) 1200 MG CAPS Take 1 capsule by mouth 2 (two) times daily.        . RABEprazole (ACIPHEX) 20 MG tablet Take 20 mg by mouth  daily.        . rosuvastatin (CRESTOR) 5 MG tablet Take 2.5 mg by mouth daily.        . SYMBICORT 80-4.5 MCG/ACT inhaler Inhale 2 puffs into the lungs Twice daily as needed.      . traMADol (ULTRAM) 50 MG tablet Take 1 tablet by mouth as needed.      . valsartan-hydrochlorothiazide (DIOVAN-HCT) 320-12.5 MG per tablet Take 1 tablet by mouth daily.          ROS: No nausea or vomiting. No fever or chills.No melena or hematochezia.No bleeding.No claudication  Physical Exam: BP 137/76  Pulse 71  Ht 5\' 6"  (1.676 m)  Wt 166 lb (75.297 kg)  BMI 26.79 kg/m2 General: Well-nourished white female in no distress Neck: Normal carotid upstroke no carotid bruits. No thyromegaly nonnodular thyroid Lungs: Clear breath sounds bilaterally no wheezing Cardiac regular rate and rhythm with normal S1-S2 no murmur rubs or gallops: Vascular: No edema. Normal distal pulses. Skin: Warm and dry Physcologic: Normal affect  12lead ECG: Not obtained Limited bedside ECHO:N/A No images are attached to the encounter.   I reviewed and summarized the old records. I reviewed ECG and prior blood work.  Assessment and Plan  VENTRICULAR TACHYCARDIA, PAROXYSMAL Patient reports no recurrent palpitations. No significant arrhythmias noted.  Chronic  cough Referred patient to ENT. Do not think is related to any of her cardiac medications.  ESSENTIAL HYPERTENSION, BENIGN Blood pressure well controlled. Continue medical regimen.  PALPITATIONS, HX OF No recurrent palpitations. No documented arrhythmias. The patient is stable    Patient Active Problem List  Diagnosis  . DM  . HYPERLIPIDEMIA  . ESSENTIAL HYPERTENSION, BENIGN  . VENTRICULAR TACHYCARDIA, PAROXYSMAL  . HEMORRHOIDS  . GERD  . PALPITATIONS, HX OF  . Chronic cough

## 2012-06-02 NOTE — Assessment & Plan Note (Signed)
Patient reports no recurrent palpitations. No significant arrhythmias noted.

## 2012-06-02 NOTE — Assessment & Plan Note (Addendum)
Referred patient to ENT. Do not think is related to any of her cardiac medications.

## 2012-06-02 NOTE — Patient Instructions (Signed)
Continue all current medications. Your physician wants you to follow up in:  1 year.  You will receive a reminder letter in the mail one-two months in advance.  If you don't receive a letter, please call our office to schedule the follow up appointment   

## 2012-06-02 NOTE — Assessment & Plan Note (Signed)
Blood pressure well controlled. Continue medical regimen.

## 2013-06-24 ENCOUNTER — Encounter: Payer: Self-pay | Admitting: Cardiovascular Disease

## 2013-07-14 ENCOUNTER — Encounter: Payer: Self-pay | Admitting: Cardiovascular Disease

## 2013-07-14 ENCOUNTER — Ambulatory Visit (INDEPENDENT_AMBULATORY_CARE_PROVIDER_SITE_OTHER): Payer: Medicare Other | Admitting: Cardiovascular Disease

## 2013-07-14 VITALS — BP 148/76 | HR 89 | Ht 66.0 in | Wt 169.0 lb

## 2013-07-14 DIAGNOSIS — I472 Ventricular tachycardia, unspecified: Secondary | ICD-10-CM

## 2013-07-14 DIAGNOSIS — R5383 Other fatigue: Secondary | ICD-10-CM

## 2013-07-14 DIAGNOSIS — R531 Weakness: Secondary | ICD-10-CM

## 2013-07-14 DIAGNOSIS — I10 Essential (primary) hypertension: Secondary | ICD-10-CM

## 2013-07-14 DIAGNOSIS — Z8679 Personal history of other diseases of the circulatory system: Secondary | ICD-10-CM

## 2013-07-14 DIAGNOSIS — R5381 Other malaise: Secondary | ICD-10-CM

## 2013-07-14 DIAGNOSIS — I8390 Asymptomatic varicose veins of unspecified lower extremity: Secondary | ICD-10-CM

## 2013-07-14 NOTE — Patient Instructions (Signed)
Continue all current medications. Your physician wants you to follow up in:  1 year.  You will receive a reminder letter in the mail one-two months in advance.  If you don't receive a letter, please call our office to schedule the follow up appointment   

## 2013-07-14 NOTE — Progress Notes (Signed)
Patient ID: Sarah Duarte, female   DOB: 1932-07-29, 79 y.o.   MRN: [id number]    SUBJECTIVE: Sarah Duarte has a h/o HTN, hyperlipidemia, COPD (by chest xray), and paroxysmal VT. An echo done in 2012 showed normal LV systolic function, EF 60-65%, mild LVH, and mild diastolic dysfunction. She told me she had a cardiac catheterization several years ago.  She feels weak all over. She informed Dr. Sherril Croon and a series of blood tests and xrays were done, and her xray showed COPD and DJD. Her TSH, renal function, hemoglobin, Vit D levels, and HbA1C were all normal.   Her TC was 161, TG 218, HDL 44, and LDL 73.  She denies chest pain and palpitations. Sh exercises three times a week. She also denies headaches. She does have leg fatigue.   Filed Vitals:   07/14/13 1350  BP: 148/76  Pulse: 89  Height: 5\' 6"  (1.676 m)  Weight: 169 lb (76.658 kg)     PHYSICAL EXAM General: NAD Neck: No JVD, no thyromegaly or thyroid nodule.  Lungs: Clear to auscultation bilaterally with normal respiratory effort. CV: Nondisplaced PMI.  Heart regular S1/S2, no S3/S4, no murmur.  No peripheral edema.  Leg varicosities noted. No carotid bruit.  Normal pedal pulses.  Abdomen: Soft, nontender, no hepatosplenomegaly, no distention.  Neurologic: Alert and oriented x 3.  Psych: Normal affect. Extremities: No clubbing or cyanosis.   ECG: normal sinus rhythm  LABS: Basic Metabolic Panel: No results found for this basename: NA, K, CL, CO2, GLUCOSE, BUN, CREATININE, CALCIUM, MG, PHOS,  in the last 72 hours Liver Function Tests: No results found for this basename: AST, ALT, ALKPHOS, BILITOT, PROT, ALBUMIN,  in the last 72 hours No results found for this basename: LIPASE, AMYLASE,  in the last 72 hours CBC: No results found for this basename: WBC, NEUTROABS, HGB, HCT, MCV, PLT,  in the last 72 hours Cardiac Enzymes: No results found for this basename: CKTOTAL, CKMB, CKMBINDEX, TROPONINI,  in the last 72 hours BNP: No  components found with this basename: POCBNP,  D-Dimer: No results found for this basename: DDIMER,  in the last 72 hours Hemoglobin A1C: No results found for this basename: HGBA1C,  in the last 72 hours Fasting Lipid Panel: No results found for this basename: CHOL, HDL, LDLCALC, TRIG, CHOLHDL, LDLDIRECT,  in the last 72 hours Thyroid Function Tests: No results found for this basename: TSH, T4TOTAL, FREET3, T3FREE, THYROIDAB,  in the last 72 hours Anemia Panel: No results found for this basename: VITAMINB12, FOLATE, FERRITIN, TIBC, IRON, RETICCTPCT,  in the last 72 hours  RADIOLOGY: No results found.    ASSESSMENT AND PLAN: 1. Paroxysmal VT: stable, no further recurrences with normal LV systolic function. 2. HTN: slightly elevated today, but will make no changes in meds. 3. Weakness: all blood tests are unremarkable, and it does not appear there is a cardiovascular etiology for this. Will defer to PCP for further workup. 4. Varicose veins: likely cause of her leg tightness. I encouraged the use of support hose.   Prentice Docker, M.D., F.A.C.C.

## 2013-10-25 ENCOUNTER — Ambulatory Visit: Payer: Medicare Other | Admitting: Gastroenterology

## 2014-07-14 ENCOUNTER — Ambulatory Visit: Payer: Medicare Other | Admitting: Cardiovascular Disease

## 2014-07-15 ENCOUNTER — Telehealth: Payer: Self-pay

## 2014-07-15 NOTE — Telephone Encounter (Addendum)
Pt was referred by Dr. Woody Seller for a screening colonoscopy. Her last one was 07/30/2002 by Dr. Gala Romney and her next was recommended in 5 years. Called pt and LMOM for a return call. She does need OV before scheduling a colonoscopy.

## 2014-07-19 ENCOUNTER — Encounter: Payer: Self-pay | Admitting: Cardiovascular Disease

## 2014-07-19 ENCOUNTER — Ambulatory Visit (INDEPENDENT_AMBULATORY_CARE_PROVIDER_SITE_OTHER): Payer: Medicare Other | Admitting: Cardiovascular Disease

## 2014-07-19 VITALS — BP 132/70 | HR 66 | Ht 66.0 in | Wt 168.0 lb

## 2014-07-19 DIAGNOSIS — I8390 Asymptomatic varicose veins of unspecified lower extremity: Secondary | ICD-10-CM

## 2014-07-19 DIAGNOSIS — K117 Disturbances of salivary secretion: Secondary | ICD-10-CM

## 2014-07-19 DIAGNOSIS — R05 Cough: Secondary | ICD-10-CM

## 2014-07-19 DIAGNOSIS — I4729 Other ventricular tachycardia: Secondary | ICD-10-CM

## 2014-07-19 DIAGNOSIS — R682 Dry mouth, unspecified: Secondary | ICD-10-CM

## 2014-07-19 DIAGNOSIS — M25476 Effusion, unspecified foot: Secondary | ICD-10-CM

## 2014-07-19 DIAGNOSIS — M25473 Effusion, unspecified ankle: Secondary | ICD-10-CM

## 2014-07-19 DIAGNOSIS — R059 Cough, unspecified: Secondary | ICD-10-CM

## 2014-07-19 DIAGNOSIS — R058 Other specified cough: Secondary | ICD-10-CM

## 2014-07-19 DIAGNOSIS — I472 Ventricular tachycardia, unspecified: Secondary | ICD-10-CM

## 2014-07-19 DIAGNOSIS — I10 Essential (primary) hypertension: Secondary | ICD-10-CM

## 2014-07-19 NOTE — Patient Instructions (Signed)
Continue all current medications. Your physician wants you to follow up in:  1 year.  You will receive a reminder letter in the mail one-two months in advance.  If you don't receive a letter, please call our office to schedule the follow up appointment   

## 2014-07-19 NOTE — Progress Notes (Addendum)
Patient ID: Sarah Duarte, female   DOB: Apr 25, 1932, 80 y.o.   MRN: [id number]      SUBJECTIVE: Sarah Duarte has a history of essential HTN, hyperlipidemia, COPD (by chest xray), and paroxysmal VT. An echo done in 2012 showed normal LV systolic function, EF 90-24%, mild LVH, and mild diastolic dysfunction. Cardiac catheterization on 08/31/07 demonstrated normal coronary arteries and congenital absence of the right kidney. She denies chest pain and shortness of breath. She occasionally has mild bilateral ankle swelling which he notices at the end of the day. She said she sometimes wakes up with a dry mouth and occasionally has a dry cough, but denies orthopnea and paroxysmal nocturnal dyspnea. She occasionally has a dry cough. She has several questions regarding her medications.   ECG performed in the office today demonstrates normal sinus rhythm with a heart rate of 71 beats per minute.   Review of Systems: As per "subjective", otherwise negative.  Allergies  Allergen Reactions  . Aspirin     Due to asthma   . Reglan [Metoclopramide] Other (See Comments)    Panic attacks    Current Outpatient Prescriptions  Medication Sig Dispense Refill  . amLODipine (NORVASC) 10 MG tablet Take 10 mg by mouth daily.        . Calcium Carbonate-Vitamin D (CALTRATE 600+D) 600-400 MG-UNIT per tablet Take 1 tablet by mouth 2 (two) times daily.      . ergocalciferol (VITAMIN D2) 50000 UNITS capsule Take 50,000 Units by mouth every 14 (fourteen) days.       Marland Kitchen glimepiride (AMARYL) 4 MG tablet Take 1 tablet by mouth 2 (two) times daily.      Marland Kitchen HYDROcodone-homatropine (HYCODAN) 5-1.5 MG/5ML syrup Take 5 mL by mouth every 6 (six) hours as needed for cough.      . metFORMIN (GLUCOPHAGE) 500 MG tablet Take 1,000 mg by mouth 2 (two) times daily with a meal.       . Multiple Vitamin (MULTIVITAMIN) tablet Take 1 tablet by mouth daily.        . pantoprazole (PROTONIX) 40 MG tablet Take 40 mg by mouth daily.       .  RABEprazole (ACIPHEX) 20 MG tablet Take 20 mg by mouth daily.        . rosuvastatin (CRESTOR) 5 MG tablet Take 2.5 mg by mouth daily.        . traMADol (ULTRAM) 50 MG tablet Take 1 tablet by mouth as needed.      . valsartan-hydrochlorothiazide (DIOVAN-HCT) 320-12.5 MG per tablet Take 1 tablet by mouth daily.         No current facility-administered medications for this visit.    Past Medical History  Diagnosis Date  . Hyperlipidemia   . Hemorrhoids     intermittently,bleeding  . Adenomatous polyp     h/o    Past Surgical History  Procedure Laterality Date  . Breast biopsy      right,for benign disease  . Tonsillectomy      History   Social History  . Marital Status: Married    Spouse Name: N/A    Number of Children: N/A  . Years of Education: N/A   Occupational History  . Not on file.   Social History Main Topics  . Smoking status: Never Smoker   . Smokeless tobacco: Never Used  . Alcohol Use: No  . Drug Use: No  . Sexual Activity: Not on file   Other Topics Concern  . Not on file  Social History Narrative   Has 2 children and has been married for 50 years.    Retired from [employer].     Filed Vitals:   07/19/14 1440  Height: 5\' 6"  (1.676 m)  Weight: 168 lb (76.204 kg)   BP 132/70  Pulse 66   PHYSICAL EXAM General: NAD HEENT: Normal. Neck: No JVD, no thyromegaly. Lungs: Clear to auscultation bilaterally with normal respiratory effort. CV: Nondisplaced PMI.  Regular rate and rhythm, normal S1/S2, no S3/S4, no murmur. No pretibial or periankle edema.  No carotid bruit.  Normal pedal pulses.  Abdomen: Soft, nontender, no hepatosplenomegaly, no distention.  Neurologic: Alert and oriented x 3.  Psych: Normal affect. Skin: Normal. Musculoskeletal: Normal range of motion, no gross deformities. Extremities: No clubbing or cyanosis.   ECG: Most recent ECG reviewed.      ASSESSMENT AND PLAN: 1. Paroxysmal VT: Stable, with no further recurrences and  normal LV systolic function with normal coronary arteries in 2008.  2. Essential HTN: Well controlled on current therapy. Will make no changes in meds.  3. Varicose veins with mild periankle swelling: Continued use of support hose. Antihypertensive therapy includes 12.5 mg HCTZ, which is sufficient. 4. Dry cough/mouth: I recommended she purchase a humidifier, as this should help to alleviate her symptoms. I do not feel she is dehydrated from such a low dose of diuretic to cause her to have a dry mouth.  Dispo: f/u 1 year.  Kate Sable, M.D., F.A.C.C.

## 2014-07-20 ENCOUNTER — Telehealth: Payer: Self-pay | Admitting: Internal Medicine

## 2014-07-20 NOTE — Telephone Encounter (Signed)
Pt's last colonoscopy per our records was 07/30/2002 by Dr. Gala Romney and the next was recommended in 5 years. She had adenomatous polyps.

## 2014-07-20 NOTE — Telephone Encounter (Signed)
Pt LMOM late yesterday to make OV. I called patient back and she is aware of OV on 9/17 at 11 with AS. Looking at her notes pt was referred by Dr Woody Seller and per DS patient needs OV prior to scheduling. Is there any records from Dr Woody Seller' office on this patient? This never made it to my in basket that patient needed OV.

## 2014-07-20 NOTE — Telephone Encounter (Signed)
So as long as we have the op note from her previous tcs we should be fine.  Sarah Duarte, did the patient mention of any new GI problems?

## 2014-07-20 NOTE — Telephone Encounter (Signed)
DS brought me the PCP referral. I have it up front with me

## 2014-08-17 ENCOUNTER — Telehealth: Payer: Self-pay

## 2014-08-17 NOTE — Telephone Encounter (Signed)
Per Sarah Duarte, I have LMOM for pt to call in regards to appt for tomorrow.

## 2014-08-18 ENCOUNTER — Ambulatory Visit (INDEPENDENT_AMBULATORY_CARE_PROVIDER_SITE_OTHER): Payer: Medicare Other | Admitting: Gastroenterology

## 2014-08-18 ENCOUNTER — Encounter: Payer: Self-pay | Admitting: Gastroenterology

## 2014-08-18 ENCOUNTER — Other Ambulatory Visit: Payer: Self-pay

## 2014-08-18 ENCOUNTER — Encounter (INDEPENDENT_AMBULATORY_CARE_PROVIDER_SITE_OTHER): Payer: Self-pay

## 2014-08-18 VITALS — BP 140/69 | HR 70 | Temp 97.4°F | Ht 64.0 in | Wt 165.0 lb

## 2014-08-18 DIAGNOSIS — D369 Benign neoplasm, unspecified site: Secondary | ICD-10-CM

## 2014-08-18 DIAGNOSIS — Z1211 Encounter for screening for malignant neoplasm of colon: Secondary | ICD-10-CM

## 2014-08-18 MED ORDER — PEG-KCL-NACL-NASULF-NA ASC-C 100 G PO SOLR: 1.0000 | ORAL | Status: DC

## 2014-08-18 NOTE — Progress Notes (Signed)
Primary Care Physician:  Glenda Chroman., MD Primary Gastroenterologist:  Dr. Gala Romney  Chief Complaint  Patient presents with  . Colonoscopy    HPI:   Sarah Duarte is a very pleasant 80 year old female presenting today with a history of adenomatous polyps in the remote past; her last lower GI evaluation was in 2003. She is quite healthy and appears younger than her stated age. She has no lower or upper GI symptoms of concern currently. No evidence of rectal bleeding, changes in bowel habits, or family history of colon cancer.    Past Medical History  Diagnosis Date  . Hyperlipidemia   . Hemorrhoids     intermittently,bleeding  . Adenomatous polyp     h/o  . COPD (chronic obstructive pulmonary disease)   . Diabetes   . GERD (gastroesophageal reflux disease)     Past Surgical History  Procedure Laterality Date  . Breast biopsy      right,for benign disease  . Tonsillectomy    . Colonoscopy  07/30/2002    WUJ:[id number] rectum/Transverse left-sided diverticulum/The remainder of the colonic mucosa appeared normal    Current Outpatient Prescriptions  Medication Sig Dispense Refill  . amLODipine (NORVASC) 10 MG tablet Take 10 mg by mouth daily.        . budesonide-formoterol (SYMBICORT) 80-4.5 MCG/ACT inhaler Inhale 1 puff into the lungs 2 (two) times daily.      . Calcium Carbonate-Vitamin D (CALTRATE 600+D) 600-400 MG-UNIT per tablet Take 1 tablet by mouth 2 (two) times daily.      . ergocalciferol (VITAMIN D2) 50000 UNITS capsule Take 50,000 Units by mouth every 14 (fourteen) days.       Marland Kitchen glimepiride (AMARYL) 4 MG tablet Take 1 tablet by mouth 2 (two) times daily.      Marland Kitchen HYDROcodone-acetaminophen (NORCO/VICODIN) 5-325 MG per tablet Take 1 tablet by mouth 3 (three) times daily as needed for moderate pain.      Marland Kitchen HYDROcodone-homatropine (HYCODAN) 5-1.5 MG/5ML syrup Take 5 mL by mouth every 6 (six) hours as needed for cough.      . metFORMIN (GLUCOPHAGE) 500 MG tablet Take 1,000  mg by mouth 2 (two) times daily with a meal.       . Multiple Vitamin (MULTIVITAMIN) tablet Take 1 tablet by mouth daily.        . Omega-3 300 MG CAPS Take 2 capsules by mouth daily.      . pantoprazole (PROTONIX) 40 MG tablet Take 40 mg by mouth daily.       . rosuvastatin (CRESTOR) 5 MG tablet Take 2.5 mg by mouth daily.        . valsartan-hydrochlorothiazide (DIOVAN-HCT) 320-12.5 MG per tablet Take 1 tablet by mouth daily.         No current facility-administered medications for this visit.    Allergies as of 08/18/2014 - Review Complete 08/18/2014  Allergen Reaction Noted  . Aspirin  06/02/2012  . Reglan [metoclopramide] Other (See Comments) 06/02/2012    Family History  Problem Relation Age of Onset  . Heart failure Mother   . COPD Father   . Cirrhosis Brother     alcohol related cirrhosis  . Colon cancer Neg Hx   . Lung cancer Brother     History   Social History  . Marital Status: Married    Spouse Name: N/A    Number of Children: N/A  . Years of Education: N/A   Occupational History  . Not on file.  Social History Main Topics  . Smoking status: Never Smoker   . Smokeless tobacco: Never Used  . Alcohol Use: No  . Drug Use: No  . Sexual Activity: Not on file   Other Topics Concern  . Not on file   Social History Narrative   Has 2 children and has been married for 50 years.    Retired from [employer].    Review of Systems: Gen: Denies any fever, chills, fatigue, weight loss, lack of appetite.  CV: Denies chest pain, heart palpitations, peripheral edema, syncope.  Resp: chronic cough  GI: see HPI GU : nocturnal frequency MS: occasional aches and pains, upper back pain Derm: Denies rash, itching, dry skin Psych: Denies depression, anxiety, memory loss, and confusion  Heme: Denies bruising, bleeding, and enlarged lymph nodes.  Physical Exam: BP 140/69  Pulse 70  Temp(Src) 97.4 F (36.3 C) (Oral)  Ht 5\' 4"  (1.626 m)  Wt 165 lb (74.844 kg)  BMI 28.31  kg/m2 General:   Alert and oriented. Pleasant and cooperative. Well-nourished and well-developed.  Head:  Normocephalic and atraumatic. Eyes:  Without icterus, sclera clear and conjunctiva pink.  Ears:  Normal auditory acuity. Nose:  No deformity, discharge,  or lesions. Mouth:  No deformity or lesions, oral mucosa pink.  Lungs:  Clear to auscultation bilaterally. No wheezes, rales, or rhonchi. No distress.  Heart:  S1, S2 present without murmurs appreciated.  Abdomen:  +BS, soft, non-tender and non-distended. No HSM noted. No guarding or rebound. No masses appreciated.  Rectal:  Deferred  Msk:  Symmetrical without gross deformities.  Extremities:  Without clubbing or edema. Neurologic:  Alert and  oriented x4;  grossly normal neurologically. Skin:  Intact without significant lesions or rashes. Psych:  Alert and cooperative. Normal mood and affect.

## 2014-08-18 NOTE — Patient Instructions (Signed)
We have scheduled you for a colonoscopy with Dr. Gala Romney in the near future.  The day before the procedure: take 1/2 dose of glimepiride and metformin at night.  The day OF the procedure: no diabetes medication.

## 2014-08-18 NOTE — Assessment & Plan Note (Signed)
80 year old female with history of adenomatous polyps in the remote past, with last colonoscopy in 2003. In good health overall, appearing younger than stated age. Appropriate for likely one more surveillance colonoscopy; she is devoid of any concerning lower GI or upper GI symptoms.   Proceed with TCS with Dr. Gala Romney in near future: the risks, benefits, and alternatives have been discussed with the patient in detail. The patient states understanding and desires to proceed.

## 2014-08-23 NOTE — Progress Notes (Signed)
cc'ed to pcp °

## 2014-08-30 ENCOUNTER — Encounter (HOSPITAL_COMMUNITY): Payer: Self-pay | Admitting: Pharmacy Technician

## 2014-09-14 ENCOUNTER — Encounter (HOSPITAL_COMMUNITY): Payer: Self-pay | Admitting: *Deleted

## 2014-09-14 ENCOUNTER — Ambulatory Visit (HOSPITAL_COMMUNITY)
Admission: RE | Admit: 2014-09-14 | Discharge: 2014-09-14 | Disposition: A | Discharge status: Home / Self Care | Payer: Medicare Other | Source: Ambulatory Visit | Attending: Internal Medicine | Admitting: Internal Medicine

## 2014-09-14 ENCOUNTER — Encounter (HOSPITAL_COMMUNITY)
Admission: RE | Disposition: A | Discharge status: Home / Self Care | Payer: Self-pay | Source: Ambulatory Visit | Attending: Internal Medicine

## 2014-09-14 DIAGNOSIS — Z860100 Personal history of colon polyps, unspecified: Secondary | ICD-10-CM

## 2014-09-14 DIAGNOSIS — K5730 Diverticulosis of large intestine without perforation or abscess without bleeding: Secondary | ICD-10-CM | POA: Diagnosis not present

## 2014-09-14 DIAGNOSIS — Z1211 Encounter for screening for malignant neoplasm of colon: Secondary | ICD-10-CM | POA: Diagnosis present

## 2014-09-14 DIAGNOSIS — E119 Type 2 diabetes mellitus without complications: Secondary | ICD-10-CM | POA: Insufficient documentation

## 2014-09-14 DIAGNOSIS — E785 Hyperlipidemia, unspecified: Secondary | ICD-10-CM | POA: Insufficient documentation

## 2014-09-14 DIAGNOSIS — J449 Chronic obstructive pulmonary disease, unspecified: Secondary | ICD-10-CM | POA: Diagnosis not present

## 2014-09-14 DIAGNOSIS — D122 Benign neoplasm of ascending colon: Secondary | ICD-10-CM

## 2014-09-14 DIAGNOSIS — K219 Gastro-esophageal reflux disease without esophagitis: Secondary | ICD-10-CM | POA: Diagnosis not present

## 2014-09-14 HISTORY — PX: COLONOSCOPY: SHX5424

## 2014-09-14 LAB — GLUCOSE, CAPILLARY: Glucose-Capillary: 223 mg/dL — ABNORMAL HIGH (ref 70–99)

## 2014-09-14 SURGERY — COLONOSCOPY
Anesthesia: Moderate Sedation

## 2014-09-14 MED ORDER — ONDANSETRON HCL 4 MG/2ML IJ SOLN
INTRAMUSCULAR | Status: AC
  Filled 2014-09-14: qty 2

## 2014-09-14 MED ORDER — MEPERIDINE HCL 100 MG/ML IJ SOLN
INTRAMUSCULAR | Status: AC
  Filled 2014-09-14: qty 2

## 2014-09-14 MED ORDER — ONDANSETRON HCL 4 MG/2ML IJ SOLN
INTRAMUSCULAR | Status: DC | PRN
Start: 2014-09-14 — End: 2014-09-14
  Administered 2014-09-14: 4 mg via INTRAVENOUS

## 2014-09-14 MED ORDER — MIDAZOLAM HCL 5 MG/5ML IJ SOLN
INTRAMUSCULAR | Status: AC
  Filled 2014-09-14: qty 10

## 2014-09-14 MED ORDER — MIDAZOLAM HCL 5 MG/5ML IJ SOLN
INTRAMUSCULAR | Status: DC | PRN
  Administered 2014-09-14: 2 mg via INTRAVENOUS
  Administered 2014-09-14 (×3): 1 mg via INTRAVENOUS

## 2014-09-14 MED ORDER — MEPERIDINE HCL 100 MG/ML IJ SOLN
INTRAMUSCULAR | Status: DC | PRN
  Administered 2014-09-14 (×3): 25 mg via INTRAVENOUS

## 2014-09-14 MED ORDER — SODIUM CHLORIDE 0.9 % IV SOLN
INTRAVENOUS | Status: DC
  Administered 2014-09-14: 10:00:00 via INTRAVENOUS

## 2014-09-14 MED ORDER — STERILE WATER FOR IRRIGATION IR SOLN
Status: DC | PRN
  Administered 2014-09-14: 11:00:00

## 2014-09-14 NOTE — Discharge Instructions (Signed)

## 2014-09-14 NOTE — Op Note (Signed)
[facility] [address] [place], [address]   COLONOSCOPY PROCEDURE REPORT  PATIENT: Quintasia, Theroux  MR#: [id number] BIRTHDATE: Sep 26, 1932 , 82  yrs. old GENDER: female ENDOSCOPIST: R.  Garfield Cornea, MD FACP [facility] REFERRED GG:YIRSW Woody Seller, M.D. PROCEDURE DATE:  09/17/14 PROCEDURE: INDICATIONS:average risk for colon cancer. MEDICATIONS: Versed 5 mg IV Demerol 75 mg in divided doses.  Zofran 4 mg IV ASA CLASS:       Class II  CONSENT: The risks, benefits, alternatives and imponderables including but not limited to bleeding, perforation as well as the possibility of a missed lesion have been reviewed.  The potential for biopsy, lesion removal, etc. have also been discussed. Questions have been answered.  All parties agreeable.  Please see the history and physical in the medical record for more information.  DESCRIPTION OF PROCEDURE:   After the risks benefits and alternatives of the procedure were thoroughly explained, informed consent was obtained.  The digital rectal exam revealed no abnormalities of the rectum.   The [id number] ([id number])  endoscope was introduced through the anus and advanced to the cecum, which was identified by both the appendix and ileocecal valve. No adverse events experienced.   The quality of the prep was     The instrument was then slowly withdrawn as the colon was fully examined.      COLON FINDINGS: Normal rectum.  Scattered left-sided diverticula. (1) diminutive polyp in the ascending segment area (2) 1 cm yellowish submucosal nodules in the ascending segment (positive pillow sign).  The remainder of the colonic mucosa appeared. Retroflexed views revealed no abnormalities. .  Withdrawal time=6 minutes 0 seconds.  The scope was withdrawn and the procedure completed. COMPLICATIONS: There were no immediate complications.  ENDOSCOPIC IMPRESSION: Colonic diverticulosis. Single colonic polyp-removed as described above. Colonic  lipoma  RECOMMENDATIONS: Followup on pathology.  eSigned:  R. Garfield Cornea, MD Rosalita Chessman [facility] 17-Sep-2014 11:29 AM   cc:  CPT CODES: ICD CODES:  The ICD and CPT codes recommended by this software are interpretations from the data that the clinical staff has captured with the software.  The verification of the translation of this report to the ICD and CPT codes and modifiers is the sole responsibility of the health care institution and practicing physician where this report was generated.  [employer] will not be held responsible for the validity of the ICD and CPT codes included on this report.  AMA assumes no liability for data contained or not contained herein. CPT is a Designer, television/film set of the [employer].  PATIENT NAME:  Lasandra, Batley MR#: [id number]

## 2014-09-14 NOTE — H&P (View-Only) (Signed)
Primary Care Physician:  Glenda Chroman., MD Primary Gastroenterologist:  Dr. Gala Romney  Chief Complaint  Patient presents with  . Colonoscopy    HPI:   Sarah Duarte is a very pleasant 80 year old female presenting today with a history of adenomatous polyps in the remote past; her last lower GI evaluation was in 2003. She is quite healthy and appears younger than her stated age. She has no lower or upper GI symptoms of concern currently. No evidence of rectal bleeding, changes in bowel habits, or family history of colon cancer.    Past Medical History  Diagnosis Date  . Hyperlipidemia   . Hemorrhoids     intermittently,bleeding  . Adenomatous polyp     h/o  . COPD (chronic obstructive pulmonary disease)   . Diabetes   . GERD (gastroesophageal reflux disease)     Past Surgical History  Procedure Laterality Date  . Breast biopsy      right,for benign disease  . Tonsillectomy    . Colonoscopy  07/30/2002    WHQ:[id number] rectum/Transverse left-sided diverticulum/The remainder of the colonic mucosa appeared normal    Current Outpatient Prescriptions  Medication Sig Dispense Refill  . amLODipine (NORVASC) 10 MG tablet Take 10 mg by mouth daily.        . budesonide-formoterol (SYMBICORT) 80-4.5 MCG/ACT inhaler Inhale 1 puff into the lungs 2 (two) times daily.      . Calcium Carbonate-Vitamin D (CALTRATE 600+D) 600-400 MG-UNIT per tablet Take 1 tablet by mouth 2 (two) times daily.      . ergocalciferol (VITAMIN D2) 50000 UNITS capsule Take 50,000 Units by mouth every 14 (fourteen) days.       Marland Kitchen glimepiride (AMARYL) 4 MG tablet Take 1 tablet by mouth 2 (two) times daily.      Marland Kitchen HYDROcodone-acetaminophen (NORCO/VICODIN) 5-325 MG per tablet Take 1 tablet by mouth 3 (three) times daily as needed for moderate pain.      Marland Kitchen HYDROcodone-homatropine (HYCODAN) 5-1.5 MG/5ML syrup Take 5 mL by mouth every 6 (six) hours as needed for cough.      . metFORMIN (GLUCOPHAGE) 500 MG tablet Take 1,000  mg by mouth 2 (two) times daily with a meal.       . Multiple Vitamin (MULTIVITAMIN) tablet Take 1 tablet by mouth daily.        . Omega-3 300 MG CAPS Take 2 capsules by mouth daily.      . pantoprazole (PROTONIX) 40 MG tablet Take 40 mg by mouth daily.       . rosuvastatin (CRESTOR) 5 MG tablet Take 2.5 mg by mouth daily.        . valsartan-hydrochlorothiazide (DIOVAN-HCT) 320-12.5 MG per tablet Take 1 tablet by mouth daily.         No current facility-administered medications for this visit.    Allergies as of 08/18/2014 - Review Complete 08/18/2014  Allergen Reaction Noted  . Aspirin  06/02/2012  . Reglan [metoclopramide] Other (See Comments) 06/02/2012    Family History  Problem Relation Age of Onset  . Heart failure Mother   . COPD Father   . Cirrhosis Brother     alcohol related cirrhosis  . Colon cancer Neg Hx   . Lung cancer Brother     History   Social History  . Marital Status: Married    Spouse Name: N/A    Number of Children: N/A  . Years of Education: N/A   Occupational History  . Not on file.  Social History Main Topics  . Smoking status: Never Smoker   . Smokeless tobacco: Never Used  . Alcohol Use: No  . Drug Use: No  . Sexual Activity: Not on file   Other Topics Concern  . Not on file   Social History Narrative   Has 2 children and has been married for 50 years.    Retired from [employer].    Review of Systems: Gen: Denies any fever, chills, fatigue, weight loss, lack of appetite.  CV: Denies chest pain, heart palpitations, peripheral edema, syncope.  Resp: chronic cough  GI: see HPI GU : nocturnal frequency MS: occasional aches and pains, upper back pain Derm: Denies rash, itching, dry skin Psych: Denies depression, anxiety, memory loss, and confusion  Heme: Denies bruising, bleeding, and enlarged lymph nodes.  Physical Exam: BP 140/69  Pulse 70  Temp(Src) 97.4 F (36.3 C) (Oral)  Ht 5\' 4"  (1.626 m)  Wt 165 lb (74.844 kg)  BMI 28.31  kg/m2 General:   Alert and oriented. Pleasant and cooperative. Well-nourished and well-developed.  Head:  Normocephalic and atraumatic. Eyes:  Without icterus, sclera clear and conjunctiva pink.  Ears:  Normal auditory acuity. Nose:  No deformity, discharge,  or lesions. Mouth:  No deformity or lesions, oral mucosa pink.  Lungs:  Clear to auscultation bilaterally. No wheezes, rales, or rhonchi. No distress.  Heart:  S1, S2 present without murmurs appreciated.  Abdomen:  +BS, soft, non-tender and non-distended. No HSM noted. No guarding or rebound. No masses appreciated.  Rectal:  Deferred  Msk:  Symmetrical without gross deformities.  Extremities:  Without clubbing or edema. Neurologic:  Alert and  oriented x4;  grossly normal neurologically. Skin:  Intact without significant lesions or rashes. Psych:  Alert and cooperative. Normal mood and affect.

## 2014-09-14 NOTE — Interval H&P Note (Signed)
History and Physical Interval Note:  09/14/2014 10:40 AM  Sarah Duarte  has presented today for surgery, with the diagnosis of Hx of adenomatous polyps  The various methods of treatment have been discussed with the patient and family. After consideration of risks, benefits and other options for treatment, the patient has consented to  Procedure(s) with comments: COLONOSCOPY (N/A) - 8:30 AM - moved to 9:30 - Ginger to notify pt as a surgical intervention .  The patient's history has been reviewed, patient examined, no change in status, stable for surgery.  I have reviewed the patient's chart and labs.  Questions were answered to the patient's satisfaction.    No change. Colonoscopy per plan.The risks, benefits, limitations, alternatives and imponderables have been reviewed with the patient. Questions have been answered. All parties are agreeable.   Manus Rudd

## 2014-09-15 ENCOUNTER — Encounter (HOSPITAL_COMMUNITY): Payer: Self-pay | Admitting: Internal Medicine

## 2014-09-19 ENCOUNTER — Encounter: Payer: Self-pay | Admitting: Internal Medicine

## 2015-07-21 ENCOUNTER — Encounter: Payer: Self-pay | Admitting: Cardiovascular Disease

## 2015-07-21 ENCOUNTER — Ambulatory Visit (INDEPENDENT_AMBULATORY_CARE_PROVIDER_SITE_OTHER): Payer: Medicare Other | Admitting: Cardiovascular Disease

## 2015-07-21 VITALS — BP 148/50 | HR 79 | Ht 65.0 in | Wt 169.0 lb

## 2015-07-21 DIAGNOSIS — I472 Ventricular tachycardia: Secondary | ICD-10-CM

## 2015-07-21 DIAGNOSIS — M25473 Effusion, unspecified ankle: Secondary | ICD-10-CM

## 2015-07-21 DIAGNOSIS — H8110 Benign paroxysmal vertigo, unspecified ear: Secondary | ICD-10-CM

## 2015-07-21 DIAGNOSIS — I868 Varicose veins of other specified sites: Secondary | ICD-10-CM | POA: Diagnosis not present

## 2015-07-21 DIAGNOSIS — M542 Cervicalgia: Secondary | ICD-10-CM

## 2015-07-21 DIAGNOSIS — I10 Essential (primary) hypertension: Secondary | ICD-10-CM | POA: Diagnosis not present

## 2015-07-21 DIAGNOSIS — I8390 Asymptomatic varicose veins of unspecified lower extremity: Secondary | ICD-10-CM

## 2015-07-21 DIAGNOSIS — I4729 Other ventricular tachycardia: Secondary | ICD-10-CM

## 2015-07-21 NOTE — Addendum Note (Signed)
Addended by: Julian Hy T on: 07/21/2015 11:06 AM   Modules accepted: Orders

## 2015-07-21 NOTE — Progress Notes (Signed)
Patient ID: Sarah Duarte, female   DOB: 09-21-1932, 81 y.o.   MRN: [id number]      SUBJECTIVE: Sarah Duarte has a history of essential HTN, hyperlipidemia, COPD (by chest xray), and paroxysmal VT. An echo done in 2012 showed normal LV systolic function, EF 13-24%, mild LVH, and mild diastolic dysfunction. Cardiac catheterization on 08/31/07 demonstrated normal coronary arteries and congenital absence of the right kidney. She denies chest pain, palpitations, and shortness of breath.  Her primary complaints relate to cervical spine pain and muscle spasm, as well as benign positional vertigo for which she has taken meclizine and received physical therapy in the past.  ECG today shows sinus rhythm with a RBBB.   Review of Systems: As per "subjective", otherwise negative.  Allergies  Allergen Reactions  . Aspirin     Due to asthma   . Reglan [Metoclopramide] Other (See Comments)    Panic attacks    Current Outpatient Prescriptions  Medication Sig Dispense Refill  . amLODipine (NORVASC) 10 MG tablet Take 10 mg by mouth daily.      . budesonide-formoterol (SYMBICORT) 80-4.5 MCG/ACT inhaler Inhale 1 puff into the lungs 2 (two) times daily.    . Calcium Carbonate-Vitamin D (CALTRATE 600+D) 600-400 MG-UNIT per tablet Take 1 tablet by mouth 2 (two) times daily.    . ergocalciferol (VITAMIN D2) 50000 UNITS capsule Take 50,000 Units by mouth every 14 (fourteen) days.     Marland Kitchen glimepiride (AMARYL) 4 MG tablet Take 1 tablet by mouth 2 (two) times daily.    Marland Kitchen HYDROcodone-acetaminophen (NORCO/VICODIN) 5-325 MG per tablet Take 1 tablet by mouth 3 (three) times daily as needed for moderate pain.    Marland Kitchen losartan-hydrochlorothiazide (HYZAAR) 100-12.5 MG per tablet Take 1 tablet by mouth daily.    . metFORMIN (GLUCOPHAGE) 500 MG tablet Take 1,000 mg by mouth 2 (two) times daily with a meal.     . Multiple Vitamin (MULTIVITAMIN) tablet Take 1 tablet by mouth daily.      . Omega-3 300 MG CAPS Take 2 capsules by  mouth daily.    . pantoprazole (PROTONIX) 40 MG tablet Take 40 mg by mouth daily.     . rosuvastatin (CRESTOR) 5 MG tablet Take 2.5 mg by mouth daily.       No current facility-administered medications for this visit.    Past Medical History  Diagnosis Date  . Hyperlipidemia   . Hemorrhoids     intermittently,bleeding  . Adenomatous polyp     h/o  . COPD (chronic obstructive pulmonary disease)   . Diabetes   . GERD (gastroesophageal reflux disease)     Past Surgical History  Procedure Laterality Date  . Breast biopsy      right,for benign disease  . Tonsillectomy    . Colonoscopy  07/30/2002    [id number] rectum/Transverse left-sided diverticulum/The remainder of the colonic mucosa appeared normal  . Colonoscopy N/A 09/14/2014    Procedure: COLONOSCOPY;  Surgeon: Daneil Dolin, MD;  [facility];  Service: Endoscopy;  Laterality: N/A;  8:30 AM - moved to 9:30 - Ginger to notify pt    Social History   Social History  . Marital Status: Married    Spouse Name: N/A  . Number of Children: N/A  . Years of Education: N/A   Occupational History  . Not on file.   Social History Main Topics  . Smoking status: Never Smoker   . Smokeless tobacco: Never Used  . Alcohol Use: No  .  Drug Use: No  . Sexual Activity: Not on file   Other Topics Concern  . Not on file   Social History Narrative   Has 2 children and has been married for 50 years.    Retired from [employer].     Filed Vitals:   07/21/15 0942  BP: 148/50  Pulse: 79  Height: 5\' 5"  (1.651 m)  Weight: 169 lb (76.658 kg)  SpO2: 97%    PHYSICAL EXAM General: NAD HEENT: Normal. Neck: No JVD, no thyromegaly. Lungs: Clear to auscultation bilaterally with normal respiratory effort. CV: Nondisplaced PMI.  Regular rate and rhythm, normal S1/S2, no S3/S4, no murmur. No pretibial or periankle edema.  No carotid bruit.  Normal pedal pulses.  Abdomen: Soft, nontender, no hepatosplenomegaly, no distention.    Neurologic: Alert and oriented x 3.  Psych: Normal affect. Skin: Normal. Musculoskeletal: Normal range of motion, no gross deformities. Extremities: No clubbing or cyanosis.   ECG: Most recent ECG reviewed.      ASSESSMENT AND PLAN: 1. Paroxysmal VT: Symptomatically stable, with no further recurrences and normal LV systolic function with normal coronary arteries in 2008.   2. Essential HTN: Reasonably controlled for age on current therapy. Will make no changes in meds.   3. Varicose veins with mild periankle swelling: Continued use of support hose.   4. Benign positional vertigo: Recommended prn use of meclizine if she is able to tolerate, as well as physical therapy.  5. Cervical spine pain: Recommended heating pad.  Dispo: f/u 1 year.  Kate Sable, M.D., F.A.C.C.

## 2015-07-21 NOTE — Patient Instructions (Signed)
Continue all current medications. Your physician wants you to follow up in:  1 year.  You will receive a reminder letter in the mail one-two months in advance.  If you don't receive a letter, please call our office to schedule the follow up appointment   

## 2016-09-04 ENCOUNTER — Encounter: Payer: Self-pay | Admitting: *Deleted

## 2016-09-05 ENCOUNTER — Encounter: Payer: Self-pay | Admitting: Cardiovascular Disease

## 2016-09-05 ENCOUNTER — Ambulatory Visit (INDEPENDENT_AMBULATORY_CARE_PROVIDER_SITE_OTHER): Payer: Medicare Other | Admitting: Cardiovascular Disease

## 2016-09-05 VITALS — BP 138/60 | HR 74 | Ht 65.0 in | Wt 168.0 lb

## 2016-09-05 DIAGNOSIS — R0609 Other forms of dyspnea: Secondary | ICD-10-CM | POA: Diagnosis not present

## 2016-09-05 DIAGNOSIS — I472 Ventricular tachycardia: Secondary | ICD-10-CM | POA: Diagnosis not present

## 2016-09-05 DIAGNOSIS — I10 Essential (primary) hypertension: Secondary | ICD-10-CM

## 2016-09-05 DIAGNOSIS — R002 Palpitations: Secondary | ICD-10-CM

## 2016-09-05 DIAGNOSIS — Z8679 Personal history of other diseases of the circulatory system: Secondary | ICD-10-CM | POA: Diagnosis not present

## 2016-09-05 DIAGNOSIS — I4510 Unspecified right bundle-branch block: Secondary | ICD-10-CM

## 2016-09-05 DIAGNOSIS — I4729 Other ventricular tachycardia: Secondary | ICD-10-CM

## 2016-09-05 NOTE — Patient Instructions (Signed)

## 2016-09-05 NOTE — Progress Notes (Signed)
SUBJECTIVE: Sarah Duarte has a history of essential HTN, hyperlipidemia, COPD (by chest xray), and paroxysmal VT.   An echo done in 2012 showed normal LV systolic function, EF 123456, mild LVH, and mild diastolic dysfunction.   Cardiac catheterization on 08/31/07 demonstrated normal coronary arteries and congenital absence of the right kidney.  She denies chest pain. She has exertional shortness of breath related to COPD. She denies palpitations or any symptoms similar to VT, but has an occasional "jittery feeling" in her chest.  ECG today shows sinus rhythm with a RBBB (no changes from last year).   Review of Systems: As per "subjective", otherwise negative.  Allergies  Allergen Reactions  . Aspirin     Due to asthma   . Reglan [Metoclopramide] Other (See Comments)    Panic attacks    Current Outpatient Prescriptions  Medication Sig Dispense Refill  . amLODipine (NORVASC) 10 MG tablet Take 10 mg by mouth daily.      . budesonide-formoterol (SYMBICORT) 80-4.5 MCG/ACT inhaler Inhale 1 puff into the lungs 2 (two) times daily.    . Calcium Carbonate-Vitamin D (CALTRATE 600+D) 600-400 MG-UNIT per tablet Take 1 tablet by mouth 2 (two) times daily.    . ergocalciferol (VITAMIN D2) 50000 UNITS capsule Take 50,000 Units by mouth every 14 (fourteen) days.     Marland Kitchen glimepiride (AMARYL) 4 MG tablet Take 1 tablet by mouth 2 (two) times daily.    Marland Kitchen HYDROcodone-acetaminophen (NORCO/VICODIN) 5-325 MG per tablet Take 1 tablet by mouth 3 (three) times daily as needed for moderate pain.    Marland Kitchen losartan-hydrochlorothiazide (HYZAAR) 100-12.5 MG per tablet Take 1 tablet by mouth daily.    . metFORMIN (GLUCOPHAGE) 500 MG tablet Take 1,000 mg by mouth 2 (two) times daily with a meal.     . Multiple Vitamin (MULTIVITAMIN) tablet Take 1 tablet by mouth daily.      . mupirocin cream (BACTROBAN) 2 % Apply 1 application topically 3 (three) times daily.    . Omega-3 300 MG CAPS Take 2 capsules by mouth  daily.    . pantoprazole (PROTONIX) 40 MG tablet Take 40 mg by mouth daily.     . rosuvastatin (CRESTOR) 5 MG tablet Take 2.5 mg by mouth daily.       No current facility-administered medications for this visit.     Past Medical History:  Diagnosis Date  . Adenomatous polyp    h/o  . COPD (chronic obstructive pulmonary disease) ([place])   . Diabetes ([place])   . GERD (gastroesophageal reflux disease)   . Hemorrhoids    intermittently,bleeding  . Hyperlipidemia     Past Surgical History:  Procedure Laterality Date  . BREAST BIOPSY     right,for benign disease  . COLONOSCOPY  07/30/2002   [id number] rectum/Transverse left-sided diverticulum/The remainder of the colonic mucosa appeared normal  . COLONOSCOPY N/A 09/14/2014   Procedure: COLONOSCOPY;  Surgeon: Daneil Dolin, MD;  [facility];  Service: Endoscopy;  Laterality: N/A;  8:30 AM - moved to 9:30 - Ginger to notify pt  . TONSILLECTOMY      Social History   Social History  . Marital status: Married    Spouse name: N/A  . Number of children: N/A  . Years of education: N/A   Occupational History  . Not on file.   Social History Main Topics  . Smoking status: Never Smoker  . Smokeless tobacco: Never Used  . Alcohol use No  . Drug  use: No  . Sexual activity: Not on file   Other Topics Concern  . Not on file   Social History Narrative   Has 2 children and has been married for 50 years.    Retired from [employer].     Vitals:   09/05/16 1105  BP: 138/60  Pulse: 74  SpO2: 96%  Weight: 168 lb (76.2 kg)  Height: 5\' 5"  (1.651 m)    PHYSICAL EXAM General: NAD HEENT: Normal. Neck: No JVD, no thyromegaly. Lungs: Clear to auscultation bilaterally with normal respiratory effort. CV: Nondisplaced PMI.  Regular rate and rhythm, normal S1/S2, no S3/S4, no murmur. No pretibial or periankle edema.  No carotid bruit.   Abdomen: Soft, nontender, no distention.  Neurologic: Alert and oriented.  Psych: Normal  affect. Skin: Normal. Musculoskeletal: No gross deformities.    ECG: Most recent ECG reviewed.      ASSESSMENT AND PLAN: 1. Paroxysmal VT: Symptomatically stable, with no further recurrences and normal LV systolic function with normal coronary arteries in 2008.   2. Essential HTN: Controlled. Will make no changes in meds.   3. Varicose veins with mild periankle swelling: Continued use of support hose.   Dispo: f/u 1 yr  Kate Sable, M.D., F.A.C.C.

## 2017-06-02 ENCOUNTER — Ambulatory Visit: Payer: Medicare Other | Admitting: Adult Health

## 2017-06-03 ENCOUNTER — Ambulatory Visit (INDEPENDENT_AMBULATORY_CARE_PROVIDER_SITE_OTHER): Payer: Medicare Other | Admitting: Cardiovascular Disease

## 2017-06-03 ENCOUNTER — Encounter: Payer: Self-pay | Admitting: *Deleted

## 2017-06-03 ENCOUNTER — Encounter: Payer: Self-pay | Admitting: Cardiovascular Disease

## 2017-06-03 VITALS — BP 148/60 | HR 74 | Ht 65.0 in | Wt 166.4 lb

## 2017-06-03 DIAGNOSIS — I472 Ventricular tachycardia: Secondary | ICD-10-CM | POA: Diagnosis not present

## 2017-06-03 DIAGNOSIS — R0609 Other forms of dyspnea: Secondary | ICD-10-CM | POA: Diagnosis not present

## 2017-06-03 DIAGNOSIS — I4510 Unspecified right bundle-branch block: Secondary | ICD-10-CM

## 2017-06-03 DIAGNOSIS — I10 Essential (primary) hypertension: Secondary | ICD-10-CM | POA: Diagnosis not present

## 2017-06-03 DIAGNOSIS — R5383 Other fatigue: Secondary | ICD-10-CM

## 2017-06-03 DIAGNOSIS — I4729 Other ventricular tachycardia: Secondary | ICD-10-CM

## 2017-06-03 NOTE — Progress Notes (Signed)
SUBJECTIVE: The patient presents for early follow-up as her PCP recommended she come and see me. She has a history of essential HTN, hyperlipidemia, COPD (by chest xray), and paroxysmal VT.   An echo done in 2012 showed normal LV systolic function, EF 91-63%, mild LVH, and mild diastolic dysfunction.   Cardiac catheterization on 08/31/07 demonstrated normal coronary arteries and congenital absence of the right kidney.  I personally reviewed all documentation from PCPs office. Office notes document fatigue with exertion and consideration of a stress test to rule out CAD.  I personally reviewed labs performed on 05/05/17 which showed hemoglobin 12.5, platelets 247, TSH 1.8, BUN 28, creatinine 1.09, sodium 140, potassium 4.7.  ECG performed in the office today which I ordered and personally interpreted demonstrates normal sinus rhythm with a RBBB.  She has been experiencing exertional dyspnea and fatigue. This can occur when she walks to her mailbox but sometimes occurs even when doing trivial activities around the house.  She denies exertional chest pain. She also denies orthopnea and paroxysmal nocturnal dyspnea. She occasionally feels "jittery inside". She is not sure whether she actually has palpitations. She actually experienced this during the time of my physical exam today and her heart rate and rhythm were normal.   Review of Systems: As per "subjective", otherwise negative.  Allergies  Allergen Reactions  . Aspirin     Due to asthma   . Reglan [Metoclopramide] Other (See Comments)    Panic attacks    Current Outpatient Prescriptions  Medication Sig Dispense Refill  . amLODipine (NORVASC) 5 MG tablet Take 5 mg by mouth daily.    . budesonide-formoterol (SYMBICORT) 80-4.5 MCG/ACT inhaler Inhale 1 puff into the lungs 2 (two) times daily.    . Calcium Carbonate-Vitamin D (CALTRATE 600+D) 600-400 MG-UNIT per tablet Take 1 tablet by mouth 2 (two) times daily.    .  ergocalciferol (VITAMIN D2) 50000 UNITS capsule Take 50,000 Units by mouth every 14 (fourteen) days.     Marland Kitchen glimepiride (AMARYL) 4 MG tablet Take 1 tablet by mouth 2 (two) times daily.    Marland Kitchen HYDROcodone-acetaminophen (NORCO/VICODIN) 5-325 MG per tablet Take 1 tablet by mouth 3 (three) times daily as needed for moderate pain.    Marland Kitchen losartan-hydrochlorothiazide (HYZAAR) 100-12.5 MG per tablet Take 1 tablet by mouth daily.    . metFORMIN (GLUCOPHAGE) 500 MG tablet Take 1,000 mg by mouth 2 (two) times daily with a meal.     . Multiple Vitamin (MULTIVITAMIN) tablet Take 1 tablet by mouth daily.      . mupirocin cream (BACTROBAN) 2 % Apply 1 application topically 3 (three) times daily.    . Omega-3 300 MG CAPS Take 2 capsules by mouth daily.    . pantoprazole (PROTONIX) 40 MG tablet Take 40 mg by mouth daily.     . rosuvastatin (CRESTOR) 5 MG tablet Take 2.5 mg by mouth daily.       No current facility-administered medications for this visit.     Past Medical History:  Diagnosis Date  . Adenomatous polyp    h/o  . COPD (chronic obstructive pulmonary disease) ([place])   . Diabetes ([place])   . GERD (gastroesophageal reflux disease)   . Hemorrhoids    intermittently,bleeding  . Hyperlipidemia     Past Surgical History:  Procedure Laterality Date  . BREAST BIOPSY     right,for benign disease  . COLONOSCOPY  07/30/2002   [id number] rectum/Transverse left-sided diverticulum/The remainder of the colonic mucosa  appeared normal  . COLONOSCOPY N/A 09/14/2014   Procedure: COLONOSCOPY;  Surgeon: Daneil Dolin, MD;  [facility];  Service: Endoscopy;  Laterality: N/A;  8:30 AM - moved to 9:30 - Ginger to notify pt  . TONSILLECTOMY      Social History   Social History  . Marital status: Married    Spouse name: N/A  . Number of children: N/A  . Years of education: N/A   Occupational History  . Not on file.   Social History Main Topics  . Smoking status: Never Smoker  . Smokeless  tobacco: Never Used  . Alcohol use No  . Drug use: No  . Sexual activity: Not on file   Other Topics Concern  . Not on file   Social History Narrative   Has 2 children and has been married for 50 years.    Retired from [employer].     Vitals:   06/03/17 1408  BP: (!) 148/60  Pulse: 74  SpO2: 96%  Weight: 166 lb 6.4 oz (75.5 kg)  Height: 5\' 5"  (1.651 m)    Wt Readings from Last 3 Encounters:  06/03/17 166 lb 6.4 oz (75.5 kg)  09/05/16 168 lb (76.2 kg)  07/21/15 169 lb (76.7 kg)     PHYSICAL EXAM General: NAD HEENT: Normal. Neck: No JVD, no thyromegaly. Lungs: Clear to auscultation bilaterally with normal respiratory effort. CV: Nondisplaced PMI.  Regular rate and rhythm, normal S1/S2, no S3/S4, no murmur. No pretibial or periankle edema.  No carotid bruit.   Abdomen: Soft, nontender, no distention.  Neurologic: Alert and oriented.  Psych: Normal affect. Skin: Normal. Musculoskeletal: No gross deformities.    ECG: Most recent ECG reviewed.   Labs: No results found for: K, BUN, CREATININE, ALT, TSH, HGB   Lipids: No results found for: LDLCALC, LDLDIRECT, CHOL, TRIG, HDL     ASSESSMENT AND PLAN: 1. Paroxysmal VT: Symptomatically stable, with no further recurrences and normal LV systolic function with normal coronary arteries in 2008.   2. Essential HTN: Controlled. Will make no changes in meds.   3. Exertional dyspnea and fatigue: I will proceed with a nuclear myocardial perfusion imaging study to evaluate for ischemic heart disease (Lexiscan Myoview). I will order a 2-D echocardiogram with Doppler to evaluate cardiac structure, function, and regional wall motion.   Disposition: Follow up 1 month  Kate Sable, M.D., F.A.C.C.

## 2017-06-03 NOTE — Patient Instructions (Signed)
Your physician recommends that you schedule a follow-up appointment in:  1 month [place] office Dr Bronson Ing     Your physician recommends that you continue on your current medications as directed. Please refer to the Current Medication list given to you today.     Your physician has requested that you have an echocardiogram. Echocardiography is a painless test that uses sound waves to create images of your heart. It provides your doctor with information about the size and shape of your heart and how well your heart's chambers and valves are working. This procedure takes approximately one hour. There are no restrictions for this procedure.     Your physician has requested that you have a lexiscan myoview. For further information please visit [email or web address]. Please follow instruction sheet, as given.      Thank you for choosing [facility] !

## 2017-06-09 ENCOUNTER — Ambulatory Visit: Payer: Medicare Other | Admitting: Cardiovascular Disease

## 2017-06-10 ENCOUNTER — Encounter (HOSPITAL_COMMUNITY)
Admission: RE | Admit: 2017-06-10 | Discharge: 2017-06-10 | Disposition: A | Discharge status: Home / Self Care | Payer: Medicare Other | Source: Ambulatory Visit | Attending: Adult Health | Admitting: Adult Health

## 2017-06-10 ENCOUNTER — Ambulatory Visit (HOSPITAL_COMMUNITY)
Admission: RE | Admit: 2017-06-10 | Discharge: 2017-06-10 | Disposition: A | Discharge status: Home / Self Care | Payer: Medicare Other | Source: Ambulatory Visit | Attending: Adult Health | Admitting: Adult Health

## 2017-06-10 ENCOUNTER — Encounter (HOSPITAL_COMMUNITY): Payer: Self-pay

## 2017-06-10 DIAGNOSIS — R0609 Other forms of dyspnea: Secondary | ICD-10-CM | POA: Diagnosis not present

## 2017-06-10 DIAGNOSIS — E785 Hyperlipidemia, unspecified: Secondary | ICD-10-CM | POA: Diagnosis not present

## 2017-06-10 DIAGNOSIS — I472 Ventricular tachycardia: Secondary | ICD-10-CM | POA: Insufficient documentation

## 2017-06-10 DIAGNOSIS — K219 Gastro-esophageal reflux disease without esophagitis: Secondary | ICD-10-CM | POA: Diagnosis not present

## 2017-06-10 DIAGNOSIS — E119 Type 2 diabetes mellitus without complications: Secondary | ICD-10-CM | POA: Diagnosis not present

## 2017-06-10 DIAGNOSIS — I10 Essential (primary) hypertension: Secondary | ICD-10-CM | POA: Insufficient documentation

## 2017-06-10 DIAGNOSIS — I071 Rheumatic tricuspid insufficiency: Secondary | ICD-10-CM | POA: Insufficient documentation

## 2017-06-10 HISTORY — DX: Unspecified asthma, uncomplicated: J45.909

## 2017-06-10 HISTORY — DX: Essential (primary) hypertension: I10

## 2017-06-10 LAB — ECHOCARDIOGRAM COMPLETE
AVLVOTPG: 6 mmHg
E/e' ratio: 15.35
EWDT: 482 ms
FS: 30 % (ref 28–44)
IVS/LV PW RATIO, ED: 1.06
LA diam end sys: 35 mm
LA diam index: 1.86 cm/m2
LA vol index: 21.4 mL/m2
LA vol: 40.2 mL
LASIZE: 35 mm
LAVOLA4C: 44.6 mL
LDCA: 2.54 cm2
LV E/e' medial: 15.35
LV E/e'average: 15.35
LV PW d: 10.9 mm — AB (ref 0.6–1.1)
LV SIMPSON'S DISK: 64
LV TDI E'LATERAL: 4.9
LV dias vol index: 27 mL/m2
LV dias vol: 50 mL (ref 46–106)
LV e' LATERAL: 4.9 cm/s
LV sys vol: 18 mL (ref 14–42)
LVOT VTI: 28.1 cm
LVOT peak vel: 124 cm/s
LVOTD: 18 mm
LVOTSV: 71 mL
LVSYSVOLIN: 10 mL/m2
Lateral S' vel: 12.8 cm/s
MV Dec: 482
MV Peak grad: 2 mmHg
MV pk E vel: 75.2 m/s
MVPKAVEL: 132 m/s
RV TAPSE: 21.9 mm
RV sys press: 21 mmHg
Reg peak vel: 212 cm/s
Stroke v: 32 ml
TDI e' medial: 4.35
TRMAXVEL: 212 cm/s

## 2017-06-10 LAB — NM MYOCAR MULTI W/SPECT W/WALL MOTION / EF
CHL CUP RESTING HR STRESS: 74 {beats}/min
LV dias vol: 54 mL (ref 46–106)
LVSYSVOL: 11 mL
Peak HR: 97 {beats}/min
RATE: 0.34
SDS: 1
SRS: 0
SSS: 1
TID: 1

## 2017-06-10 MED ORDER — SODIUM CHLORIDE 0.9% FLUSH
INTRAVENOUS | Status: AC
  Administered 2017-06-10: 10 mL via INTRAVENOUS
  Filled 2017-06-10: qty 10

## 2017-06-10 MED ORDER — TECHNETIUM TC 99M TETROFOSMIN IV KIT
30.0000 | PACK | Freq: Once | INTRAVENOUS | Status: AC | PRN
  Administered 2017-06-10: 32.1 via INTRAVENOUS

## 2017-06-10 MED ORDER — REGADENOSON 0.4 MG/5ML IV SOLN
INTRAVENOUS | Status: AC
  Administered 2017-06-10: 0.4 mg via INTRAVENOUS
  Filled 2017-06-10: qty 5

## 2017-06-10 MED ORDER — TECHNETIUM TC 99M TETROFOSMIN IV KIT
10.0000 | PACK | Freq: Once | INTRAVENOUS | Status: AC | PRN
Start: 2017-06-10 — End: 2017-06-10
  Administered 2017-06-10: 10 via INTRAVENOUS

## 2017-06-10 NOTE — Progress Notes (Signed)
*  PRELIMINARY RESULTS* Echocardiogram 2D Echocardiogram has been performed.  Sarah Duarte 06/10/2017, 9:21 AM

## 2017-06-25 ENCOUNTER — Telehealth: Payer: Self-pay | Admitting: *Deleted

## 2017-06-25 NOTE — Telephone Encounter (Signed)
Pt wanted results for stress test and echocardiogram - gave pt normal stress test results - routed to covering provider for Dr Bronson Ing for echo results    Notes recorded by Lendon Colonel, NP on 06/10/2017 at 5:00 PM EDT Normal stress test. No changes in her regimen.

## 2017-06-26 NOTE — Telephone Encounter (Signed)
Echo is normal, Dr Raliegh Ip to discuss all details at f/u 8/3  Sarah Abts MD

## 2017-06-26 NOTE — Telephone Encounter (Signed)
LM to return call.

## 2017-06-27 NOTE — Telephone Encounter (Signed)
LM to return call.

## 2017-06-30 NOTE — Telephone Encounter (Signed)
LM X 3 days pt has f/u 8/3 this week

## 2017-07-04 ENCOUNTER — Encounter: Payer: Self-pay | Admitting: Cardiovascular Disease

## 2017-07-04 ENCOUNTER — Ambulatory Visit (INDEPENDENT_AMBULATORY_CARE_PROVIDER_SITE_OTHER): Payer: Medicare Other | Admitting: Cardiovascular Disease

## 2017-07-04 VITALS — BP 160/68 | HR 84 | Ht 65.0 in | Wt 166.0 lb

## 2017-07-04 DIAGNOSIS — R5383 Other fatigue: Secondary | ICD-10-CM

## 2017-07-04 DIAGNOSIS — R0609 Other forms of dyspnea: Secondary | ICD-10-CM | POA: Diagnosis not present

## 2017-07-04 DIAGNOSIS — I472 Ventricular tachycardia: Secondary | ICD-10-CM

## 2017-07-04 DIAGNOSIS — I10 Essential (primary) hypertension: Secondary | ICD-10-CM | POA: Diagnosis not present

## 2017-07-04 DIAGNOSIS — I4729 Other ventricular tachycardia: Secondary | ICD-10-CM

## 2017-07-04 NOTE — Patient Instructions (Signed)
Your physician wants you to follow-up in: 1 YEAR WITH DR KONESWARAN You will receive a reminder letter in the mail two months in advance. If you don't receive a letter, please call our office to schedule the follow-up appointment.  Your physician recommends that you continue on your current medications as directed. Please refer to the Current Medication list given to you today.  Thank you for choosing [facility]!!    

## 2017-07-04 NOTE — Progress Notes (Signed)
SUBJECTIVE: The patient returns for follow-up after undergoing cardiovascular testing performed for the evaluation of exertional dyspnea and fatigue.  She underwent a normal nuclear stress test on 06/10/17.  Echocardiogram demonstrated normal left ventricular systolic function and regional wall motion, LVEF 65-70%, mild LVH, grade 1 diastolic dysfunction with elevated filling pressures.  Her primary complaints relate to cough. She does get short of breath walking to her mailbox. She tells me she has been diagnosed with COPD. I do not have a copy of her pulmonary function test. This is managed by her PCP.  Her blood pressure is elevated today, 160/68. When she had been on higher doses of amlodipine she developed ankle swelling.    Review of Systems: As per "subjective", otherwise negative.  Allergies  Allergen Reactions  . Aspirin     Due to asthma   . Reglan [Metoclopramide] Other (See Comments)    Panic attacks    Current Outpatient Prescriptions  Medication Sig Dispense Refill  . amLODipine (NORVASC) 5 MG tablet Take 5 mg by mouth daily.    . budesonide-formoterol (SYMBICORT) 80-4.5 MCG/ACT inhaler Inhale 1 puff into the lungs 2 (two) times daily.    . Calcium Carbonate-Vitamin D (CALTRATE 600+D) 600-400 MG-UNIT per tablet Take 1 tablet by mouth 2 (two) times daily.    . ergocalciferol (VITAMIN D2) 50000 UNITS capsule Take 50,000 Units by mouth every 14 (fourteen) days.     Marland Kitchen glimepiride (AMARYL) 4 MG tablet Take 1 tablet by mouth 2 (two) times daily.    Marland Kitchen HYDROcodone-acetaminophen (NORCO/VICODIN) 5-325 MG per tablet Take 1 tablet by mouth 3 (three) times daily as needed for moderate pain.    Marland Kitchen losartan-hydrochlorothiazide (HYZAAR) 100-12.5 MG per tablet Take 1 tablet by mouth daily.    . metFORMIN (GLUCOPHAGE) 500 MG tablet Take 1,000 mg by mouth 2 (two) times daily with a meal.     . Multiple Vitamin (MULTIVITAMIN) tablet Take 1 tablet by mouth daily.      . mupirocin  cream (BACTROBAN) 2 % Apply 1 application topically 3 (three) times daily.    . Omega-3 300 MG CAPS Take 2 capsules by mouth daily.    . pantoprazole (PROTONIX) 40 MG tablet Take 40 mg by mouth daily.     . rosuvastatin (CRESTOR) 5 MG tablet Take 2.5 mg by mouth daily.       No current facility-administered medications for this visit.     Past Medical History:  Diagnosis Date  . Adenomatous polyp    h/o  . Asthma   . COPD (chronic obstructive pulmonary disease) ([place])   . Diabetes ([place])   . GERD (gastroesophageal reflux disease)   . Hemorrhoids    intermittently,bleeding  . Hyperlipidemia   . Hypertension     Past Surgical History:  Procedure Laterality Date  . BREAST BIOPSY     right,for benign disease  . COLONOSCOPY  07/30/2002   [id number] rectum/Transverse left-sided diverticulum/The remainder of the colonic mucosa appeared normal  . COLONOSCOPY N/A 09/14/2014   Procedure: COLONOSCOPY;  Surgeon: Daneil Dolin, MD;  [facility];  Service: Endoscopy;  Laterality: N/A;  8:30 AM - moved to 9:30 - Ginger to notify pt  . TONSILLECTOMY      Social History   Social History  . Marital status: Widowed    Spouse name: N/A  . Number of children: N/A  . Years of education: N/A   Occupational History  . Not on file.  Social History Main Topics  . Smoking status: Never Smoker  . Smokeless tobacco: Never Used  . Alcohol use No  . Drug use: No  . Sexual activity: Not on file   Other Topics Concern  . Not on file   Social History Narrative   Has 2 children and has been married for 50 years.    Retired from [employer].     Vitals:   07/04/17 1057  BP: (!) 160/68  Pulse: 84  SpO2: 98%  Weight: 166 lb (75.3 kg)  Height: 5\' 5"  (1.651 m)    Wt Readings from Last 3 Encounters:  07/04/17 166 lb (75.3 kg)  06/03/17 166 lb 6.4 oz (75.5 kg)  09/05/16 168 lb (76.2 kg)     PHYSICAL EXAM General: NAD HEENT: Normal. Neck: No JVD, no thyromegaly. Lungs:  Clear to auscultation bilaterally with normal respiratory effort. CV: Nondisplaced PMI.  Regular rate and rhythm, normal S1/S2, no S3/S4, no murmur. No pretibial or periankle edema.  No carotid bruit.   Abdomen: Soft, nontender, no distention.  Neurologic: Alert and oriented.  Psych: Normal affect. Skin: Normal. Musculoskeletal: No gross deformities.    ECG: Most recent ECG reviewed.   Labs: No results found for: K, BUN, CREATININE, ALT, TSH, HGB   Lipids: No results found for: LDLCALC, LDLDIRECT, CHOL, TRIG, HDL     ASSESSMENT AND PLAN:  1. Paroxysmal VT: Symptomatically stable, with no further recurrences and normal LV systolic function with normal coronary arteries in 2008.   2. Essential HTN: Markedly elevated. Higher doses of amlodipine led to ankle swelling. I have asked her to have her PCP monitor this as further antihypertensive titration may be indicated.  3. Exertional dyspnea and fatigue: Stress test was normal. She has normal thyroid function and has no evidence of anemia. Echocardiogram demonstrated grade 1 diastolic dysfunction with elevated filling pressures and her blood pressure is elevated today, which may be contributing to her symptoms.  I have asked her to have her PCP monitor this as further antihypertensive titration may be indicated. If her symptoms are due to COPD progression, she may need to see a pulmonologist. I will defer to her PCP for further management.      Disposition: Follow up 1 yr  Kate Sable, M.D., F.A.C.C.

## 2017-12-01 ENCOUNTER — Encounter (HOSPITAL_COMMUNITY): Payer: Self-pay | Admitting: Emergency Medicine

## 2017-12-01 ENCOUNTER — Other Ambulatory Visit: Payer: Self-pay

## 2017-12-01 ENCOUNTER — Observation Stay (HOSPITAL_COMMUNITY)
Admission: EM | Admit: 2017-12-01 | Discharge: 2017-12-04 | Disposition: A | Discharge status: Home / Self Care | Payer: Medicare Other | Attending: Internal Medicine | Admitting: Internal Medicine

## 2017-12-01 DIAGNOSIS — I4891 Unspecified atrial fibrillation: Principal | ICD-10-CM | POA: Insufficient documentation

## 2017-12-01 DIAGNOSIS — Z79899 Other long term (current) drug therapy: Secondary | ICD-10-CM | POA: Insufficient documentation

## 2017-12-01 DIAGNOSIS — R410 Disorientation, unspecified: Secondary | ICD-10-CM | POA: Diagnosis present

## 2017-12-01 DIAGNOSIS — E119 Type 2 diabetes mellitus without complications: Secondary | ICD-10-CM | POA: Insufficient documentation

## 2017-12-01 DIAGNOSIS — K219 Gastro-esophageal reflux disease without esophagitis: Secondary | ICD-10-CM | POA: Diagnosis not present

## 2017-12-01 DIAGNOSIS — I10 Essential (primary) hypertension: Secondary | ICD-10-CM | POA: Insufficient documentation

## 2017-12-01 DIAGNOSIS — R110 Nausea: Secondary | ICD-10-CM | POA: Diagnosis not present

## 2017-12-01 DIAGNOSIS — J45909 Unspecified asthma, uncomplicated: Secondary | ICD-10-CM | POA: Insufficient documentation

## 2017-12-01 DIAGNOSIS — Z794 Long term (current) use of insulin: Secondary | ICD-10-CM | POA: Diagnosis not present

## 2017-12-01 DIAGNOSIS — J449 Chronic obstructive pulmonary disease, unspecified: Secondary | ICD-10-CM | POA: Insufficient documentation

## 2017-12-01 DIAGNOSIS — E785 Hyperlipidemia, unspecified: Secondary | ICD-10-CM | POA: Insufficient documentation

## 2017-12-01 DIAGNOSIS — I480 Paroxysmal atrial fibrillation: Secondary | ICD-10-CM | POA: Diagnosis present

## 2017-12-01 LAB — COMPREHENSIVE METABOLIC PANEL
ALBUMIN: 3.9 g/dL (ref 3.5–5.0)
ALT: 22 U/L (ref 14–54)
AST: 22 U/L (ref 15–41)
Alkaline Phosphatase: 58 U/L (ref 38–126)
Anion gap: 14 (ref 5–15)
BILIRUBIN TOTAL: 0.3 mg/dL (ref 0.3–1.2)
BUN: 22 mg/dL — AB (ref 6–20)
CHLORIDE: 100 mmol/L — AB (ref 101–111)
CO2: 23 mmol/L (ref 22–32)
CREATININE: 1.34 mg/dL — AB (ref 0.44–1.00)
Calcium: 10.2 mg/dL (ref 8.9–10.3)
GFR calc Af Amer: 41 mL/min — ABNORMAL LOW (ref >60)
GFR, EST NON AFRICAN AMERICAN: 36 mL/min — AB (ref >60)
GLUCOSE: 165 mg/dL — AB (ref 65–99)
POTASSIUM: 4.1 mmol/L (ref 3.5–5.1)
Sodium: 137 mmol/L (ref 135–145)
Total Protein: 7.7 g/dL (ref 6.5–8.1)

## 2017-12-01 LAB — LIPASE, BLOOD: LIPASE: 29 U/L (ref 11–51)

## 2017-12-01 LAB — URINALYSIS, ROUTINE W REFLEX MICROSCOPIC
BACTERIA UA: NONE SEEN
Bilirubin Urine: NEGATIVE
Glucose, UA: NEGATIVE mg/dL
Hgb urine dipstick: NEGATIVE
Ketones, ur: NEGATIVE mg/dL
Leukocytes, UA: NEGATIVE
Nitrite: NEGATIVE
PROTEIN: 100 mg/dL — AB
SPECIFIC GRAVITY, URINE: 1.023 (ref 1.005–1.030)
pH: 5 (ref 5.0–8.0)

## 2017-12-01 LAB — CBC
HEMATOCRIT: 39.5 % (ref 36.0–46.0)
Hemoglobin: 12.3 g/dL (ref 12.0–15.0)
MCH: 26.2 pg (ref 26.0–34.0)
MCHC: 31.1 g/dL (ref 30.0–36.0)
MCV: 84.2 fL (ref 78.0–100.0)
PLATELETS: 351 10*3/uL (ref 150–400)
RBC: 4.69 MIL/uL (ref 3.87–5.11)
RDW: 14.4 % (ref 11.5–15.5)
WBC: 7.5 10*3/uL (ref 4.0–10.5)

## 2017-12-01 LAB — MAGNESIUM: MAGNESIUM: 1.5 mg/dL — AB (ref 1.7–2.4)

## 2017-12-01 MED ORDER — HEPARIN BOLUS VIA INFUSION
3000.0000 [IU] | Freq: Once | INTRAVENOUS | Status: AC
  Administered 2017-12-01: 3000 [IU] via INTRAVENOUS

## 2017-12-01 MED ORDER — HEPARIN (PORCINE) IN NACL 100-0.45 UNIT/ML-% IJ SOLN
14.0000 [IU]/kg/h | INTRAMUSCULAR | Status: DC
  Administered 2017-12-01: 14 [IU]/kg/h via INTRAVENOUS
  Filled 2017-12-01: qty 250

## 2017-12-01 MED ORDER — METOPROLOL TARTRATE 5 MG/5ML IV SOLN
5.0000 mg | Freq: Once | INTRAVENOUS | Status: AC
  Administered 2017-12-01: 5 mg via INTRAVENOUS
  Filled 2017-12-01: qty 5

## 2017-12-01 NOTE — Progress Notes (Signed)
ANTICOAGULATION CONSULT NOTE - Initial Consult  Pharmacy Consult for heparin Indication: atrial fibrillation - new onset  Allergies  Allergen Reactions  . Aspirin     Due to asthma   . Reglan [Metoclopramide] Other (See Comments)    Panic attacks    Patient Measurements: Height: 5\' 6"  (167.6 cm) Weight: 156 lb (70.8 kg) IBW/kg (Calculated) : 59.3 Heparin Dosing Weight: 70.8 kg  Vital Signs: Temp: 98.2 F (36.8 C) (12/31 1518) Temp Source: Oral (12/31 1518) BP: 141/83 (12/31 2230) Pulse Rate: 79 (12/31 2230)  Labs: Recent Labs    12/01/17 1751  HGB 12.3  HCT 39.5  PLT 351  CREATININE 1.34*    Estimated Creatinine Clearance: 29.8 mL/min (A) (by C-G formula based on SCr of 1.34 mg/dL (H)).   Medical History: Past Medical History:  Diagnosis Date  . Adenomatous polyp    h/o  . Asthma   . COPD (chronic obstructive pulmonary disease) ([place])   . Diabetes ([place])   . GERD (gastroesophageal reflux disease)   . Hemorrhoids    intermittently,bleeding  . Hyperlipidemia   . Hypertension     Medications:  No anticoagulants PTA  Assessment: 83 yo F with new onset afib and high CHADS2-VASc score - admitted to begin anticoagulation with heparin  Goal of Therapy:  Heparin level 0.3-0.7 units/ml Monitor platelets by anticoagulation protocol: Yes   Plan:  - Heparin 3000 units IV bolus x1  - Heparin 14 units/kg/hr = 1000 units/hr continuous infusion - f/u 8h heparin level - f/u daily heparin level and CBC  Vonda Antigua 12/01/2017,11:06 PM

## 2017-12-01 NOTE — ED Provider Notes (Signed)
[facility] EMERGENCY DEPARTMENT Provider Note   CSN: [id number] Arrival date & time: 12/01/17  1501     History   Chief Complaint Chief Complaint  Patient presents with  . Nausea    HPI Sarah Duarte is a 83 y.o. female.  HPI  The patient is an 83 year old female, she has a known history of diabetes COPD and hypertension and had been admitted to an outside hospital approximately 2 months ago with what the son reports was double pneumonia.  She spent a short time in the hospital and was discharged to a rehab facility where she spent the better part of 2-1/2 weeks before going home.  The family noted that during the course of this time the patient had a progressive cognitive decline and was having difficulty remembering people, remembering names, had some strange conversations and has developed a progression of anxiety and a paranoia for things as simple as taking a shower which she refuses to do unless someone is there to help her.  These are all new developments, she has not had these in the past, she has not had any injuries or falls, she has had an extensive workup again at the same hospital including an MRI and an observational admission which according to the son revealed no specific findings.  He brings her here today for a repeat evaluation as she is complaining that she has sick, by this she clarifies that she is nauseated and does not want to eat.  Of note the patient was prescribed prednisone at some point, we are unsure of the antibiotics that were prescribed during that timeframe, records have been requested from the outside hospital.  At this time there is no coughing fevers diarrhea and she has not been vomiting.  She sleeps poorly, she takes Xanax before bed and states that she only sleeps for about an hour  Past Medical History:  Diagnosis Date  . Adenomatous polyp    h/o  . Asthma   . COPD (chronic obstructive pulmonary disease) ([place])   . Diabetes ([place])   . GERD  (gastroesophageal reflux disease)   . Hemorrhoids    intermittently,bleeding  . Hyperlipidemia   . Hypertension     Patient Active Problem List   Diagnosis Date Noted  . New onset atrial fibrillation ([place]) 12/01/2017  . Adenomatous polyps 08/18/2014  . Chronic cough 06/02/2012  . DM 08/14/2009  . ESSENTIAL HYPERTENSION, BENIGN 08/14/2009  . VENTRICULAR TACHYCARDIA, PAROXYSMAL 08/14/2009  . GERD 08/14/2009  . PALPITATIONS, HX OF 08/14/2009  . HYPERLIPIDEMIA 08/11/2009  . HEMORRHOIDS 08/11/2009    Past Surgical History:  Procedure Laterality Date  . BREAST BIOPSY     right,for benign disease  . COLONOSCOPY  07/30/2002   DEY:[id number] rectum/Transverse left-sided diverticulum/The remainder of the colonic mucosa appeared normal  . COLONOSCOPY N/A 09/14/2014   Procedure: COLONOSCOPY;  Surgeon: Daneil Dolin, MD;  [facility];  Service: Endoscopy;  Laterality: N/A;  8:30 AM - moved to 9:30 - Ginger to notify pt  . TONSILLECTOMY      OB History    No data available       Home Medications    Prior to Admission medications   Medication Sig Start Date End Date Taking? Authorizing Provider  amLODipine (NORVASC) 5 MG tablet Take 5 mg by mouth daily.    [provider]  budesonide-formoterol (SYMBICORT) 80-4.5 MCG/ACT inhaler Inhale 1 puff into the lungs 2 (two) times daily.    [provider]  Calcium Carbonate-Vitamin D (CALTRATE 600+D) 600-400 MG-UNIT per tablet Take 1 tablet by mouth 2 (two) times daily.    [provider]  ergocalciferol (VITAMIN D2) 50000 UNITS capsule Take 50,000 Units by mouth every 14 (fourteen) days.     [provider]  glimepiride (AMARYL) 4 MG tablet Take 1 tablet by mouth 2 (two) times daily. 07/04/14   [provider]  HYDROcodone-acetaminophen (NORCO/VICODIN) 5-325 MG per tablet Take 1 tablet by mouth 3 (three) times daily as needed for moderate pain.    [provider]    losartan-hydrochlorothiazide (HYZAAR) 100-12.5 MG per tablet Take 1 tablet by mouth daily.    [provider]  metFORMIN (GLUCOPHAGE) 500 MG tablet Take 1,000 mg by mouth 2 (two) times daily with a meal.     [provider]  Multiple Vitamin (MULTIVITAMIN) tablet Take 1 tablet by mouth daily.      [provider]  mupirocin cream (BACTROBAN) 2 % Apply 1 application topically 3 (three) times daily.    [provider]  Omega-3 300 MG CAPS Take 2 capsules by mouth daily.    [provider]  pantoprazole (PROTONIX) 40 MG tablet Take 40 mg by mouth daily.  07/06/13   [provider]  rosuvastatin (CRESTOR) 5 MG tablet Take 2.5 mg by mouth daily.      [provider]    Family History Family History  Problem Relation Age of Onset  . COPD Father   . Heart failure Mother   . Cirrhosis Brother        alcohol related cirrhosis  . Lung cancer Brother   . Colon cancer Neg Hx     Social History Social History   Tobacco Use  . Smoking status: Never Smoker  . Smokeless tobacco: Never Used  Substance Use Topics  . Alcohol use: No  . Drug use: No     Allergies   Aspirin and Reglan [metoclopramide]   Review of Systems Review of Systems  All other systems reviewed and are negative.    Physical Exam Updated Vital Signs BP (!) 141/83   Pulse 79 Comment: Validated bad data.Checked on pt heart rate in room/fix data  Temp 98.2 F (36.8 C) (Oral)   Resp (!) 23   Ht 5\' 6"  (1.676 m)   Wt 70.8 kg (156 lb)   SpO2 96%   BMI 25.18 kg/m   Physical Exam  Constitutional: She appears well-developed and well-nourished. No distress.  HENT:  Head: Normocephalic and atraumatic.  Mouth/Throat: Oropharynx is clear and moist. No oropharyngeal exudate.  Eyes: Conjunctivae and EOM are normal. Pupils are equal, round, and reactive to light. Right eye exhibits no discharge. Left eye exhibits no discharge. No scleral icterus.  Neck: Normal  range of motion. Neck supple. No JVD present. No thyromegaly present.  Cardiovascular: Normal rate, regular rhythm, normal heart sounds and intact distal pulses. Exam reveals no gallop and no friction rub.  No murmur heard. Irregular rhythm, normal pulses  Pulmonary/Chest: Effort normal and breath sounds normal. No respiratory distress. She has no wheezes. She has no rales.  Abdominal: Soft. Bowel sounds are normal. She exhibits no distension and no mass. There is no tenderness.  Musculoskeletal: Normal range of motion. She exhibits no edema or tenderness.  Lymphadenopathy:    She has no cervical adenopathy.  Neurological: She is alert. Coordination normal.  The patient is able to speak without any difficulty, her memory seems to be working just fine today, she  follows commands without difficulty in all 4 extremities and has no facial droop  Skin: Skin is warm and dry. No rash noted. No erythema.  Psychiatric: She has a normal mood and affect. Her behavior is normal.  Nursing note and vitals reviewed.    ED Treatments / Results  Labs (all labs ordered are listed, but only abnormal results are displayed) Labs Reviewed  COMPREHENSIVE METABOLIC PANEL - Abnormal; Notable for the following components:      Result Value   Chloride 100 (*)    Glucose, Bld 165 (*)    BUN 22 (*)    Creatinine, Ser 1.34 (*)    GFR calc non Af Amer 36 (*)    GFR calc Af Amer 41 (*)    All other components within normal limits  URINALYSIS, ROUTINE W REFLEX MICROSCOPIC - Abnormal; Notable for the following components:   APPearance HAZY (*)    Protein, ur 100 (*)    Squamous Epithelial / LPF 0-5 (*)    All other components within normal limits  LIPASE, BLOOD  CBC  MAGNESIUM    EKG  EKG Interpretation  Date/Time:  Monday December 01 2017 20:47:44 EST Ventricular Rate:  105 PR Interval:    QRS Duration: 132 QT Interval:  372 QTC Calculation: 492 R Axis:   83 Text Interpretation:  Atrial fibrillation  Right bundle branch block No old tracing to compare Abnormal ekg Confirmed by Noemi Chapel [phone]) on 12/01/2017 9:50:53 PM       Radiology No results found.  Procedures Procedures (including critical care time)  Medications Ordered in ED Medications  metoprolol tartrate (LOPRESSOR) injection 5 mg (5 mg Intravenous Given 12/01/17 2205)     Initial Impression / Assessment and Plan / ED Course  I have reviewed the triage vital signs and the nursing notes.  Pertinent labs & imaging results that were available during my care of the patient were reviewed by me and considered in my medical decision making (see chart for details).    Labs thus far are rather unremarkable, will need an EKG to further evaluate the irregular rhythm as she has no history of atrial fibrillation.  I would consider her cognitive decline and the neurologic abnormalities to be consistent with a possible use of steroids, she may have been taking fluoroquinolones which could have neurocognitive downstream effects, this could also be psychiatric with lack of sleep and increased anxiety.  Will request records, labs here are unremarkable  New onset atrial fibrillation, EKG confirms, high ChadsVasc score, will need to admit for anticoagulation as in the holiday and no availability to see here PCP for several days.  She likely had stroke during prior admission as per the records that we received (MRI + for stroke).  New onset afib Not seen during prior admit Not on anticoagulants This patients CHA2DS2-VASc Score and unadjusted Ischemic Stroke Rate (% per year) is equal to 11.2 % stroke rate/year from a score of 7  Above score calculated as 1 point each if present [CHF, HTN, DM, Vascular=MI/PAD/Aortic Plaque, Age if 65-74, or Female] Above score calculated as 2 points each if present [Age > 75, or Stroke/TIA/TE]   D/w Dr. Olevia Bowens who will admit  Final Clinical Impressions(s) / ED Diagnoses   Final diagnoses:  Atrial  fibrillation, new onset ([place])  Confusion      Noemi Chapel, MD 12/01/17 2257

## 2017-12-01 NOTE — H&P (Signed)
History and Physical    LETETIA ROMANELLO XBJ:[id number] DOB: 1932-09-19 DOA: 12/01/2017  PCP: Glenda Chroman, MD   Patient coming from: Home.  I have personally briefly reviewed patient's old medical records in [place]  Chief Complaint: Nausea and abdominal pain for 2-3 months.  HPI: Sarah Duarte is a 83 y.o. female with medical history significant of colon polyps, asthma/COPD, type 2 diabetes, GERD, history of hemorrhoids with intermittent bleeding, hyperlipidemia, hypertension who is brought to the emergency department due to complaints of nausea and abdominal pain for the past 2-3 months.  However, her son stated that she has been eating without significant distress.  When asked about this pain, the patient seems to be confused and unable to elaborate.  Her son stated earlier that she has been progressively declining on her mental status, has had trouble remembering people, names and events.  He also mentioned that she has been having all conversations, seems to be paranoid and anxious.  She has been having trouble sleeping, despite using alprazolam.  She has been refusing to take a shower on her own, and only takes it if somebody is present with her.  She was admitted recently due to double pneumonia.  She was also diagnosed with a stroke.  Her son brought her to the emergency department, because she has been complaining of persistent nausea.  However, while I was evaluating the patient she seems to be eating without any trouble.  He did not report any other symptoms.  ED Course: Initial vital signs were temperature 98.22F, pulse 113, respirations 18, blood pressure 142/83 mmHg and O2 sat 95% on room air.  She was noticed to be on atrial fibrillation with RVR.  She was given metoprolol 5 mg IVP and started on a heparin infusion.  Her workup shows a an urinalysis with mild proteinuria at 100 mg/dL, a normal CBC, normal lipase.  Her CMP/chemistry showed a chloride of 100 millimol/L.   Glucose of magnesium of 1.5, 165, BUN of 22 and creatinine 1.34 mg/dL.  Her EKG show atrial fibrillation with RVR at 105 bpm ventricular rate, right bundle branch block.  There is no previous EKG to compare to.   Review of Systems: As earlier mentioned on HPI.    Past Medical History:  Diagnosis Date  . Adenomatous polyp    h/o  . Asthma   . COPD (chronic obstructive pulmonary disease) ([place])   . Diabetes ([place])   . GERD (gastroesophageal reflux disease)   . Hemorrhoids    intermittently,bleeding  . Hyperlipidemia   . Hypertension     Past Surgical History:  Procedure Laterality Date  . BREAST BIOPSY     right,for benign disease  . COLONOSCOPY  07/30/2002   HYQ:MVHQIO rectum/Transverse left-sided diverticulum/The remainder of the colonic mucosa appeared normal  . COLONOSCOPY N/A 09/14/2014   Procedure: COLONOSCOPY;  Surgeon: Daneil Dolin, MD;  Location[facility];  Service: Endoscopy;  Laterality: N/A;  8:30 AM - moved to 9:30 - Ginger to notify pt  . TONSILLECTOMY       reports that  has never smoked. she has never used smokeless tobacco. She reports that she does not drink alcohol or use drugs.  Allergies  Allergen Reactions  . Aspirin     Due to asthma   . Reglan [Metoclopramide] Other (See Comments)    Panic attacks    Family History  Problem Relation Age of Onset  . COPD Father   . Heart failure  Mother   . Cirrhosis Brother        alcohol related cirrhosis  . Lung cancer Brother   . Colon cancer Neg Hx     Prior to Admission medications   Medication Sig Start Date End Date Taking? Authorizing Provider  amLODipine (NORVASC) 5 MG tablet Take 5 mg by mouth daily.    [provider]  budesonide-formoterol (SYMBICORT) 80-4.5 MCG/ACT inhaler Inhale 1 puff into the lungs 2 (two) times daily.    [provider]  Calcium Carbonate-Vitamin D (CALTRATE 600+D) 600-400 MG-UNIT per tablet Take 1 tablet by mouth 2 (two) times daily.    [provider]  ergocalciferol (VITAMIN D2) 50000 UNITS capsule Take 50,000 Units by mouth every 14 (fourteen) days.     [provider]  glimepiride (AMARYL) 4 MG tablet Take 1 tablet by mouth 2 (two) times daily. 07/04/14   [provider]  HYDROcodone-acetaminophen (NORCO/VICODIN) 5-325 MG per tablet Take 1 tablet by mouth 3 (three) times daily as needed for moderate pain.    [provider]  losartan-hydrochlorothiazide (HYZAAR) 100-12.5 MG per tablet Take 1 tablet by mouth daily.    [provider]  metFORMIN (GLUCOPHAGE) 500 MG tablet Take 1,000 mg by mouth 2 (two) times daily with a meal.     [provider]  Multiple Vitamin (MULTIVITAMIN) tablet Take 1 tablet by mouth daily.      [provider]  mupirocin cream (BACTROBAN) 2 % Apply 1 application topically 3 (three) times daily.    [provider]  Omega-3 300 MG CAPS Take 2 capsules by mouth daily.    [provider]  pantoprazole (PROTONIX) 40 MG tablet Take 40 mg by mouth daily.  07/06/13   [provider]  rosuvastatin (CRESTOR) 5 MG tablet Take 2.5 mg by mouth daily.      [provider]    Physical Exam: Vitals:   12/01/17 2100 12/01/17 2130 12/01/17 2204 12/01/17 2230  BP: (!) 149/56 (!) 145/63 137/89 (!) 141/83  Pulse: 97 (!) 106 (!) 112 79  Resp: (!) 21 (!) 21 (!) 27 (!) 23  Temp:      TempSrc:      SpO2: 96% 98% 98% 96%  Weight:      Height:        Constitutional: NAD, calm, comfortable Eyes: PERRL, lids and conjunctivae normal ENMT: Mucous membranes are moist. Posterior pharynx clear of any exudate or lesions. Neck: normal, supple, no masses, no thyromegaly Respiratory: clear to auscultation bilaterally, no wheezing, no crackles. Normal respiratory effort. No accessory muscle use.  Cardiovascular: Irregularly irregular at 113 bpm, no murmurs / rubs / gallops. No extremity edema. 2+ pedal pulses. No carotid bruits.  Abdomen: Soft,  no tenderness, no masses palpated. No hepatosplenomegaly. Bowel sounds positive.  Musculoskeletal: no clubbing / cyanosis. Good ROM, no contractures. Normal muscle tone.  Skin: no rashes, lesions, ulcers. No induration Neurologic: CN 2-12 grossly intact. Sensation intact, DTR normal. Strength 5/5 in all 4.  Psychiatric:  Alert and oriented x 2, partially oriented to time and situation.    Labs on Admission: I have personally reviewed following labs and imaging studies  CBC: Recent Labs  Lab 12/01/17 1751  WBC 7.5  HGB 12.3  HCT 39.5  MCV 84.2  PLT 532   Basic Metabolic Panel: Recent Labs  Lab 12/01/17 1751  NA 137  K 4.1  CL 100*  CO2 23  GLUCOSE 165*  BUN 22*  CREATININE 1.34*  CALCIUM 10.2   GFR: Estimated Creatinine Clearance: 29.8 mL/min (A) (by C-G formula based on SCr of 1.34 mg/dL (H)). Liver Function Tests: Recent Labs  Lab 12/01/17 1751  AST 22  ALT 22  ALKPHOS 58  BILITOT 0.3  PROT 7.7  ALBUMIN 3.9   Recent Labs  Lab 12/01/17 1751  LIPASE 29   No results for input(s): AMMONIA in the last 168 hours. Coagulation Profile: No results for input(s): INR, PROTIME in the last 168 hours. Cardiac Enzymes: No results for input(s): CKTOTAL, CKMB, CKMBINDEX, TROPONINI in the last 168 hours. BNP (last 3 results) No results for input(s): PROBNP in the last 8760 hours. HbA1C: No results for input(s): HGBA1C in the last 72 hours. CBG: No results for input(s): GLUCAP in the last 168 hours. Lipid Profile: No results for input(s): CHOL, HDL, LDLCALC, TRIG, CHOLHDL, LDLDIRECT in the last 72 hours. Thyroid Function Tests: No results for input(s): TSH, T4TOTAL, FREET4, T3FREE, THYROIDAB in the last 72 hours. Anemia Panel: No results for input(s): VITAMINB12, FOLATE, FERRITIN, TIBC, IRON, RETICCTPCT in the last 72 hours. Urine analysis:    Component Value Date/Time   COLORURINE YELLOW 12/01/2017 2000   APPEARANCEUR HAZY (A) 12/01/2017 2000   LABSPEC 1.023  12/01/2017 2000   PHURINE 5.0 12/01/2017 2000   GLUCOSEU NEGATIVE 12/01/2017 2000   HGBUR NEGATIVE 12/01/2017 2000   BILIRUBINUR NEGATIVE 12/01/2017 [place] 12/01/2017 2000   PROTEINUR 100 (A) 12/01/2017 2000   NITRITE NEGATIVE 12/01/2017 2000   LEUKOCYTESUR NEGATIVE 12/01/2017 2000    Radiological Exams on Admission: No results found.  EKG: Independently reviewed. Vent. rate 105 BPM PR interval * ms QRS duration 132 ms QT/QTc 372/492 ms P-R-T axes * 83 43 Atrial fibrillation Right bundle branch block No old tracing to compare Abnormal ekg  Assessment/Plan Principal Problem:   New onset atrial fibrillation ([place]) Easily controlled with IV metoprolol. Her CHA?DS?-VASc is at least 7. Telemetry/observation. Started on IV heparin in the emergency department Hold amlodipine to decrease positive chronotropic effect Start metoprolol succinate 25 mg p.o. daily.  Adjust up as needed. Metoprolol titrate 5 mg IVP every 6 hours as needed for tachycardia. Optimize magnesium and potassium. Check echocardiogram in a.m. Bridge to oral anticoagulant prior to discharge.  Active Problems:   Type 2 diabetes mellitus (HCC) Continue Amaryl 4 mg p.o. twice daily. Continue metformin 1000 mg p.o. twice daily. CBG monitoring with sensitive regular insulin sliding scale    Hyperlipidemia Continue Crestor 5 mg p.o. daily. Monitor LFTs as needed.    Essential hypertension, benign Hold amlodipine. Continue Hyzaar 100/12.5 mg p.o. daily. Start metoprolol succinate 25 mg p.o. daily. Metoprolol 5 mg IVP as needed every 6 hours for tachycardia.    GERD Protonix 40 mg p.o. daily.    COPD (chronic obstructive pulmonary disease) (HCC) Continue Symbicort. Supplemental oxygen as needed. Xopenex nebulizer treatment as needed.    Hypomagnesemia Replacing.   DVT prophylaxis: On heparin infusion Code Status: Full code. Family Communication: Her son was present in the ED and  [place] rooms. Disposition Plan: Admit for rate control, anticoagulation and further evaluation. Consults called:  Admission status: Observation/telemetry.   Reubin Milan MD [facility] Pager [phone].  If 7PM-7AM, please contact night-coverage [email or web address] Password [email or web address]  12/01/2017, 11:19 PM

## 2017-12-01 NOTE — ED Triage Notes (Addendum)
Nausea and all over abd pain x 2-3 months.  Pt has been to pcp and has not been given any answers.  Denies gi/gu s/s.  Denies vomiting or diarrhea.  Pt states that she feels better sometimes after she eats.

## 2017-12-01 NOTE — ED Notes (Signed)
Pt was treated for double pneumonia November 2018.

## 2017-12-02 ENCOUNTER — Encounter (HOSPITAL_COMMUNITY): Payer: Self-pay

## 2017-12-02 ENCOUNTER — Other Ambulatory Visit: Payer: Self-pay

## 2017-12-02 DIAGNOSIS — E118 Type 2 diabetes mellitus with unspecified complications: Secondary | ICD-10-CM

## 2017-12-02 DIAGNOSIS — J449 Chronic obstructive pulmonary disease, unspecified: Secondary | ICD-10-CM

## 2017-12-02 DIAGNOSIS — Z794 Long term (current) use of insulin: Secondary | ICD-10-CM

## 2017-12-02 DIAGNOSIS — K219 Gastro-esophageal reflux disease without esophagitis: Secondary | ICD-10-CM | POA: Diagnosis not present

## 2017-12-02 DIAGNOSIS — I4891 Unspecified atrial fibrillation: Secondary | ICD-10-CM | POA: Diagnosis not present

## 2017-12-02 DIAGNOSIS — E785 Hyperlipidemia, unspecified: Secondary | ICD-10-CM

## 2017-12-02 DIAGNOSIS — I10 Essential (primary) hypertension: Secondary | ICD-10-CM | POA: Diagnosis not present

## 2017-12-02 DIAGNOSIS — R410 Disorientation, unspecified: Secondary | ICD-10-CM | POA: Diagnosis not present

## 2017-12-02 LAB — GLUCOSE, CAPILLARY
GLUCOSE-CAPILLARY: 156 mg/dL — AB (ref 65–99)
GLUCOSE-CAPILLARY: 181 mg/dL — AB (ref 65–99)
Glucose-Capillary: 145 mg/dL — ABNORMAL HIGH (ref 65–99)
Glucose-Capillary: 134 mg/dL — ABNORMAL HIGH (ref 65–99)

## 2017-12-02 LAB — TSH: TSH: 1.218 u[IU]/mL (ref 0.350–4.500)

## 2017-12-02 LAB — CBC
HCT: 35.1 % — ABNORMAL LOW (ref 36.0–46.0)
Hemoglobin: 11.2 g/dL — ABNORMAL LOW (ref 12.0–15.0)
MCH: 26.7 pg (ref 26.0–34.0)
MCHC: 31.9 g/dL (ref 30.0–36.0)
MCV: 83.8 fL (ref 78.0–100.0)
PLATELETS: 305 10*3/uL (ref 150–400)
RBC: 4.19 MIL/uL (ref 3.87–5.11)
RDW: 14.3 % (ref 11.5–15.5)
WBC: 7.6 10*3/uL (ref 4.0–10.5)

## 2017-12-02 LAB — HEPARIN LEVEL (UNFRACTIONATED): Heparin Unfractionated: 0.23 IU/mL — ABNORMAL LOW (ref 0.30–0.70)

## 2017-12-02 LAB — MRSA PCR SCREENING: MRSA by PCR: NEGATIVE

## 2017-12-02 MED ORDER — METOPROLOL TARTRATE 5 MG/5ML IV SOLN: 5.0000 mg | Freq: Four times a day (QID) | INTRAVENOUS | Status: DC | PRN

## 2017-12-02 MED ORDER — HEPARIN (PORCINE) IN NACL 100-0.45 UNIT/ML-% IJ SOLN
1150.0000 [IU]/h | INTRAMUSCULAR | Status: DC
  Administered 2017-12-02: 1150 [IU]/h via INTRAVENOUS

## 2017-12-02 MED ORDER — METOPROLOL TARTRATE 5 MG/5ML IV SOLN
5.0000 mg | Freq: Once | INTRAVENOUS | Status: AC
  Administered 2017-12-02: 5 mg via INTRAVENOUS
  Filled 2017-12-02: qty 5

## 2017-12-02 MED ORDER — HYDROCODONE-ACETAMINOPHEN 5-325 MG PO TABS
1.0000 | ORAL_TABLET | Freq: Three times a day (TID) | ORAL | Status: DC | PRN
  Administered 2017-12-02 – 2017-12-03 (×3): 1 via ORAL
  Filled 2017-12-02 (×3): qty 1

## 2017-12-02 MED ORDER — GLIMEPIRIDE 2 MG PO TABS
4.0000 mg | ORAL_TABLET | Freq: Two times a day (BID) | ORAL | Status: DC
  Administered 2017-12-02 – 2017-12-04 (×5): 4 mg via ORAL
  Filled 2017-12-02 (×5): qty 2

## 2017-12-02 MED ORDER — CALCIUM CARBONATE-VITAMIN D 600-400 MG-UNIT PO TABS: 1.0000 | ORAL_TABLET | Freq: Two times a day (BID) | ORAL | Status: DC

## 2017-12-02 MED ORDER — METFORMIN HCL 500 MG PO TABS
1000.0000 mg | ORAL_TABLET | Freq: Two times a day (BID) | ORAL | Status: DC
  Administered 2017-12-02 – 2017-12-04 (×5): 1000 mg via ORAL
  Filled 2017-12-02 (×5): qty 2

## 2017-12-02 MED ORDER — FAMOTIDINE 20 MG PO TABS
20.0000 mg | ORAL_TABLET | Freq: Every day | ORAL | Status: DC
  Administered 2017-12-02 – 2017-12-03 (×2): 20 mg via ORAL
  Filled 2017-12-02 (×2): qty 1

## 2017-12-02 MED ORDER — LOSARTAN POTASSIUM 50 MG PO TABS
100.0000 mg | ORAL_TABLET | Freq: Every day | ORAL | Status: DC
  Administered 2017-12-02 – 2017-12-04 (×3): 100 mg via ORAL
  Filled 2017-12-02 (×3): qty 2

## 2017-12-02 MED ORDER — INSULIN GLARGINE 100 UNIT/ML ~~LOC~~ SOLN
12.0000 [IU] | Freq: Every day | SUBCUTANEOUS | Status: DC
  Administered 2017-12-02 – 2017-12-04 (×3): 12 [IU] via SUBCUTANEOUS
  Filled 2017-12-02 (×4): qty 0.12

## 2017-12-02 MED ORDER — LOSARTAN POTASSIUM-HCTZ 100-12.5 MG PO TABS: 1.0000 | ORAL_TABLET | Freq: Every day | ORAL | Status: DC

## 2017-12-02 MED ORDER — PANTOPRAZOLE SODIUM 40 MG PO TBEC
40.0000 mg | DELAYED_RELEASE_TABLET | Freq: Every day | ORAL | Status: DC
  Administered 2017-12-02: 40 mg via ORAL
  Filled 2017-12-02: qty 1

## 2017-12-02 MED ORDER — MAGNESIUM SULFATE 4 GM/100ML IV SOLN
4.0000 g | Freq: Once | INTRAVENOUS | Status: AC
  Administered 2017-12-02: 4 g via INTRAVENOUS
  Filled 2017-12-02 (×2): qty 100

## 2017-12-02 MED ORDER — CALCIUM CARBONATE-VITAMIN D 500-200 MG-UNIT PO TABS
1.0000 | ORAL_TABLET | Freq: Two times a day (BID) | ORAL | Status: DC
  Administered 2017-12-02 – 2017-12-04 (×5): 1 via ORAL
  Filled 2017-12-02 (×5): qty 1

## 2017-12-02 MED ORDER — METOPROLOL SUCCINATE ER 25 MG PO TB24
25.0000 mg | ORAL_TABLET | Freq: Every day | ORAL | Status: DC
  Administered 2017-12-02 – 2017-12-03 (×2): 25 mg via ORAL
  Filled 2017-12-02 (×2): qty 1

## 2017-12-02 MED ORDER — LEVALBUTEROL HCL 0.63 MG/3ML IN NEBU
0.6300 mg | INHALATION_SOLUTION | Freq: Four times a day (QID) | RESPIRATORY_TRACT | Status: DC | PRN
Start: 2017-12-02 — End: 2017-12-04

## 2017-12-02 MED ORDER — HYDROCHLOROTHIAZIDE 12.5 MG PO CAPS
12.5000 mg | ORAL_CAPSULE | Freq: Every day | ORAL | Status: DC
  Administered 2017-12-02 – 2017-12-04 (×3): 12.5 mg via ORAL
  Filled 2017-12-02 (×3): qty 1

## 2017-12-02 MED ORDER — INSULIN ASPART 100 UNIT/ML ~~LOC~~ SOLN
0.0000 [IU] | Freq: Three times a day (TID) | SUBCUTANEOUS | Status: DC
  Administered 2017-12-02: 2 [IU] via SUBCUTANEOUS
  Administered 2017-12-02 (×2): 1 [IU] via SUBCUTANEOUS
  Administered 2017-12-03: 2 [IU] via SUBCUTANEOUS

## 2017-12-02 MED ORDER — HEPARIN BOLUS VIA INFUSION
1000.0000 [IU] | Freq: Once | INTRAVENOUS | Status: AC
  Administered 2017-12-02: 1000 [IU] via INTRAVENOUS
  Filled 2017-12-02: qty 1000

## 2017-12-02 MED ORDER — PANTOPRAZOLE SODIUM 40 MG PO TBEC
40.0000 mg | DELAYED_RELEASE_TABLET | Freq: Two times a day (BID) | ORAL | Status: DC
Start: 2017-12-02 — End: 2017-12-04
  Administered 2017-12-02 – 2017-12-04 (×5): 40 mg via ORAL
  Filled 2017-12-02 (×5): qty 1

## 2017-12-02 MED ORDER — ROSUVASTATIN CALCIUM 5 MG PO TABS
2.5000 mg | ORAL_TABLET | Freq: Every day | ORAL | Status: DC
  Administered 2017-12-02 – 2017-12-04 (×3): 2.5 mg via ORAL
  Filled 2017-12-02 (×4): qty 0.5

## 2017-12-02 MED ORDER — RIVAROXABAN 15 MG PO TABS
15.0000 mg | ORAL_TABLET | Freq: Every day | ORAL | Status: DC
  Administered 2017-12-02 – 2017-12-04 (×3): 15 mg via ORAL
  Filled 2017-12-02 (×4): qty 1

## 2017-12-02 MED ORDER — MOMETASONE FURO-FORMOTEROL FUM 100-5 MCG/ACT IN AERO
2.0000 | INHALATION_SPRAY | Freq: Two times a day (BID) | RESPIRATORY_TRACT | Status: DC
  Administered 2017-12-02 – 2017-12-04 (×5): 2 via RESPIRATORY_TRACT
  Filled 2017-12-02: qty 8.8

## 2017-12-02 NOTE — Progress Notes (Signed)
PROGRESS NOTE    Sarah Duarte  KKX:[id number] DOB: [birth year] DOA: 12/01/2017 PCP: Glenda Chroman, MD    Brief Narrative:  84 year old female with history of COPD, diabetes, hypertension, is brought to the hospital with nausea and abdominal pain.  Found to be in rapid atrial fibrillation.  Started on rate control as well as anticoagulation.  Nausea and abdominal pain also appear to be chronic issues.  She is able to eat her meals without difficulty.  Plan is to continue antiemetics for now.  Question related to GERD.  She is also had progressive confusion since her last hospitalization.  Etiology is unclear.  Possibly related to medications.  Xanax and Flexeril have been held.  Family plans to follow-up with outpatient neurology.  Physical therapy consultation has been requested.  Family would prefer assisted living placement if possible.   Assessment & Plan:   Principal Problem:   New onset atrial fibrillation (HCC) Active Problems:   Type 2 diabetes mellitus ([place])   Hyperlipidemia   Essential hypertension, benign   GERD   COPD (chronic obstructive pulmonary disease) (HCC)   Hypomagnesemia   1. Atrial fibrillation with rapid ventricular response.  Heart rate is better controlled on oral beta-blockers.  She was started on anticoagulation with intravenous heparin.  This will be transitioned to oral Xarelto.  TSH will be ordered.  Echocardiogram is also been ordered. 2. Diabetes.  Continue on glimepiride and metformin.  Resume Lantus.  Follow sliding scale. 3. Hyperlipidemia.  Continue statin 4. Hypertension.  Amlodipine currently on hold.  ARB/hydrochlorothiazide has been continued.  She is also on metoprolol.  Blood pressure stable. 5. GERD.  Continue on Protonix 6. COPD.  Continue Symbicort.  Bronchodilators as needed.  Respiratory status appears to be near baseline. 7. Nausea.  Does not appear to be affecting patient's ability to keep down any food.  She seems to be eating  without difficulty.  Question related to GERD.  Continue on PPI.  Use antiemetics as needed.  Abdomen is otherwise benign on exam. 8. Confusion.  Son reports intermittent confusion since last hospitalization.  With development of atrial fib, prior CVA certainly a possibility.  Other etiologies include medication effect since she doses her on medication and is on Xanax/Flexeril.  She does not have any obvious signs of infection.  No clear metabolic causes.  She may also have some underlying slow cognitive decline that has become more apparent.  Family plans on outpatient neurology evaluation.   DVT prophylaxis: Xarelto Code Status: Full code Family Communication: Discussed with son over the phone Disposition Plan: Pending physical therapy evaluation.  Son is requesting possible assisted living placement    Consultants:     Procedures:     Antimicrobials:       Subjective: Has some nausea, no vomiting.  No palpitations or shortness of breath.  No chest pain.  Objective: Vitals:   12/02/17 0034 12/02/17 0439 12/02/17 0855 12/02/17 1500  BP: (!) 155/86 (!) 144/51  133/78  Pulse: 87 90  85  Resp: 18 18  19   Temp: 98.9 F (37.2 C) 98.5 F (36.9 C)  98.3 F (36.8 C)  TempSrc: Oral Oral  Oral  SpO2: 98% 97% 96% 97%  Weight: 73 kg (160 lb 15 oz)     Height: 5\' 6"  (1.676 m)       Intake/Output Summary (Last 24 hours) at 12/02/2017 1818 Last data filed at 12/02/2017 1500 Gross per 24 hour  Intake 1516.53 ml  Output -  Net 1516.53 ml   Filed Weights   12/01/17 1518 12/02/17 0034  Weight: 70.8 kg (156 lb) 73 kg (160 lb 15 oz)    Examination:  General exam: Appears calm and comfortable  Respiratory system: Clear to auscultation. Respiratory effort normal. Cardiovascular system: S1 & S2 heard, irregular. No JVD, murmurs, rubs, gallops or clicks. No pedal edema. Gastrointestinal system: Abdomen is nondistended, soft and nontender. No organomegaly or masses felt. Normal bowel  sounds heard. Central nervous system: Alert and oriented. No focal neurological deficits. Extremities: Symmetric 5 x 5 power. Skin: No rashes, lesions or ulcers Psychiatry: Judgement and insight appear normal. Mood & affect appropriate.     Data Reviewed: I have personally reviewed following labs and imaging studies  CBC: Recent Labs  Lab 12/01/17 1751 12/02/17 0757  WBC 7.5 7.6  HGB 12.3 11.2*  HCT 39.5 35.1*  MCV 84.2 83.8  PLT 351 627   Basic Metabolic Panel: Recent Labs  Lab 12/01/17 1751  NA 137  K 4.1  CL 100*  CO2 23  GLUCOSE 165*  BUN 22*  CREATININE 1.34*  CALCIUM 10.2  MG 1.5*   GFR: Estimated Creatinine Clearance: 32.5 mL/min (A) (by C-G formula based on SCr of 1.34 mg/dL (H)). Liver Function Tests: Recent Labs  Lab 12/01/17 1751  AST 22  ALT 22  ALKPHOS 58  BILITOT 0.3  PROT 7.7  ALBUMIN 3.9   Recent Labs  Lab 12/01/17 1751  LIPASE 29   No results for input(s): AMMONIA in the last 168 hours. Coagulation Profile: No results for input(s): INR, PROTIME in the last 168 hours. Cardiac Enzymes: No results for input(s): CKTOTAL, CKMB, CKMBINDEX, TROPONINI in the last 168 hours. BNP (last 3 results) No results for input(s): PROBNP in the last 8760 hours. HbA1C: No results for input(s): HGBA1C in the last 72 hours. CBG: Recent Labs  Lab 12/02/17 0043 12/02/17 0758 12/02/17 1106 12/02/17 1640  GLUCAP 156* 145* 181* 134*   Lipid Profile: No results for input(s): CHOL, HDL, LDLCALC, TRIG, CHOLHDL, LDLDIRECT in the last 72 hours. Thyroid Function Tests: No results for input(s): TSH, T4TOTAL, FREET4, T3FREE, THYROIDAB in the last 72 hours. Anemia Panel: No results for input(s): VITAMINB12, FOLATE, FERRITIN, TIBC, IRON, RETICCTPCT in the last 72 hours. Sepsis Labs: No results for input(s): PROCALCITON, LATICACIDVEN in the last 168 hours.  Recent Results (from the past 240 hour(s))  MRSA PCR Screening     Status: None   Collection Time:  12/02/17 12:52 AM  Result Value Ref Range Status   MRSA by PCR NEGATIVE NEGATIVE Final    Comment:        The GeneXpert MRSA Assay (FDA approved for NASAL specimens only), is one component of a comprehensive MRSA colonization surveillance program. It is not intended to diagnose MRSA infection nor to guide or monitor treatment for MRSA infections.          Radiology Studies: No results found.      Scheduled Meds: . calcium-vitamin D  1 tablet Oral BID  . famotidine  20 mg Oral QHS  . glimepiride  4 mg Oral BID AC  . hydrochlorothiazide  12.5 mg Oral Daily  . insulin aspart  0-9 Units Subcutaneous TID WC  . insulin glargine  12 Units Subcutaneous Daily  . losartan  100 mg Oral Daily  . metFORMIN  1,000 mg Oral BID WC  . metoprolol succinate  25 mg Oral Daily  . mometasone-formoterol  2 puff Inhalation BID  . pantoprazole  40 mg Oral BID AC  . rosuvastatin  2.5 mg Oral Daily   Continuous Infusions: . heparin 1,150 Units/hr (12/02/17 1112)     LOS: 0 days    Time spent: 26mins    Kathie Dike, MD [facility] Pager [phone]  If 7PM-7AM, please contact night-coverage [email or web address] Password [email or web address] 12/02/2017, 6:18 PM

## 2017-12-02 NOTE — Progress Notes (Signed)
[facility]  Patient Status: Active (receiving services up to time of hospitalization)  [facility] is providing the following services: RN, PT and OT  If patient discharges after hours, please call [phone].   Romualdo Bolk RN 12/02/2017, 10:56 AM

## 2017-12-02 NOTE — Progress Notes (Signed)
ANTICOAGULATION CONSULT NOTE - Consult  Pharmacy Consult for heparin--->xarelto Indication: atrial fibrillation - new onset  Allergies  Allergen Reactions  . Aspirin     Due to asthma   . Fosamax [Alendronate Sodium]   . Levaquin [Levofloxacin]   . Lipitor [Atorvastatin]   . Lisinopril   . Symbicort [Budesonide-Formoterol Fumarate]   . Reglan [Metoclopramide] Other (See Comments)    Panic attacks    Patient Measurements: Height: 5\' 6"  (167.6 cm) Weight: 160 lb 15 oz (73 kg) IBW/kg (Calculated) : 59.3 Heparin Dosing Weight: 70.8 kg  Vital Signs: Temp: 98.3 F (36.8 C) (01/01 1500) Temp Source: Oral (01/01 1500) BP: 133/78 (01/01 1500) Pulse Rate: 85 (01/01 1500)  Labs: Recent Labs    12/01/17 1751 12/02/17 0757  HGB 12.3 11.2*  HCT 39.5 35.1*  PLT 351 305  HEPARINUNFRC  --  0.23*  CREATININE 1.34*  --     Estimated Creatinine Clearance: 32.5 mL/min (A) (by C-G formula based on SCr of 1.34 mg/dL (H)).   Medical History: Past Medical History:  Diagnosis Date  . Adenomatous polyp    h/o  . Asthma   . COPD (chronic obstructive pulmonary disease) ([place])   . Diabetes ([place])   . GERD (gastroesophageal reflux disease)   . Hemorrhoids    intermittently,bleeding  . Hyperlipidemia   . Hypertension     Medications:  No anticoagulants PTA  Assessment: 84 yo F with new onset afib and high CHADS2-VASc score - admitted to begin anticoagulation with heparin. Now transitioning to oral therapy with xarelto  Goal of Therapy:  Monitor platelets by anticoagulation protocol: Yes   Plan:  xarelto 15mg  po daily with evening meal Education on xarelto Monitor for s/s of bleeding  Isac Sarna, BS Vena [place], BCPS Clinical Pharmacist Pager [phone] 12/02/2017,6:23 PM

## 2017-12-02 NOTE — Progress Notes (Signed)
ANTICOAGULATION CONSULT NOTE - Initial Consult  Pharmacy Consult for heparin Indication: atrial fibrillation - new onset  Allergies  Allergen Reactions  . Aspirin     Due to asthma   . Reglan [Metoclopramide] Other (See Comments)    Panic attacks    Patient Measurements: Height: 5\' 6"  (167.6 cm) Weight: 160 lb 15 oz (73 kg) IBW/kg (Calculated) : 59.3 Heparin Dosing Weight: 70.8 kg  Vital Signs: Temp: 98.5 F (36.9 C) (01/01 0439) Temp Source: Oral (01/01 0439) BP: 144/51 (01/01 0439) Pulse Rate: 90 (01/01 0439)  Labs: Recent Labs    12/01/17 1751 12/02/17 0757  HGB 12.3 11.2*  HCT 39.5 35.1*  PLT 351 305  HEPARINUNFRC  --  0.23*  CREATININE 1.34*  --     Estimated Creatinine Clearance: 32.5 mL/min (A) (by C-G formula based on SCr of 1.34 mg/dL (H)).   Medical History: Past Medical History:  Diagnosis Date  . Adenomatous polyp    h/o  . Asthma   . COPD (chronic obstructive pulmonary disease) ([place])   . Diabetes ([place])   . GERD (gastroesophageal reflux disease)   . Hemorrhoids    intermittently,bleeding  . Hyperlipidemia   . Hypertension     Medications:  No anticoagulants PTA  Assessment: 84 yo F with new onset afib and high CHADS2-VASc score - admitted to begin anticoagulation with heparin. Heparin level subtherapeutic.  Goal of Therapy:  Heparin level 0.3-0.7 units/ml Monitor platelets by anticoagulation protocol: Yes   Plan:  - Heparin 1000 units IV bolus x1  - Increase Heparin 1150 units/hr continuous infusion - f/u 8h heparin level - f/u daily heparin level and CBC  Isac Sarna, BS [place], BCPS Clinical Pharmacist Pager [phone] 12/02/2017,10:53 AM

## 2017-12-03 ENCOUNTER — Observation Stay (HOSPITAL_BASED_OUTPATIENT_CLINIC_OR_DEPARTMENT_OTHER): Payer: Medicare Other

## 2017-12-03 DIAGNOSIS — I4891 Unspecified atrial fibrillation: Secondary | ICD-10-CM

## 2017-12-03 DIAGNOSIS — R410 Disorientation, unspecified: Secondary | ICD-10-CM | POA: Diagnosis not present

## 2017-12-03 DIAGNOSIS — J449 Chronic obstructive pulmonary disease, unspecified: Secondary | ICD-10-CM | POA: Diagnosis not present

## 2017-12-03 DIAGNOSIS — I10 Essential (primary) hypertension: Secondary | ICD-10-CM | POA: Diagnosis not present

## 2017-12-03 LAB — GLUCOSE, CAPILLARY
GLUCOSE-CAPILLARY: 170 mg/dL — AB (ref 65–99)
GLUCOSE-CAPILLARY: 119 mg/dL — AB (ref 65–99)
GLUCOSE-CAPILLARY: 82 mg/dL (ref 65–99)
Glucose-Capillary: 109 mg/dL — ABNORMAL HIGH (ref 65–99)

## 2017-12-03 LAB — BASIC METABOLIC PANEL
Anion gap: 11 (ref 5–15)
BUN: 23 mg/dL — AB (ref 6–20)
CHLORIDE: 99 mmol/L — AB (ref 101–111)
CO2: 24 mmol/L (ref 22–32)
CREATININE: 1.11 mg/dL — AB (ref 0.44–1.00)
Calcium: 9.3 mg/dL (ref 8.9–10.3)
GFR calc Af Amer: 52 mL/min — ABNORMAL LOW (ref >60)
GFR calc non Af Amer: 45 mL/min — ABNORMAL LOW (ref >60)
GLUCOSE: 121 mg/dL — AB (ref 65–99)
POTASSIUM: 3.7 mmol/L (ref 3.5–5.1)
SODIUM: 134 mmol/L — AB (ref 135–145)

## 2017-12-03 LAB — ECHOCARDIOGRAM COMPLETE
Height: 66 in
WEIGHTICAEL: 2574.97 [oz_av]

## 2017-12-03 LAB — CBC
HCT: 35 % — ABNORMAL LOW (ref 36.0–46.0)
Hemoglobin: 11.2 g/dL — ABNORMAL LOW (ref 12.0–15.0)
MCH: 26.6 pg (ref 26.0–34.0)
MCHC: 32 g/dL (ref 30.0–36.0)
MCV: 83.1 fL (ref 78.0–100.0)
PLATELETS: 302 10*3/uL (ref 150–400)
RBC: 4.21 MIL/uL (ref 3.87–5.11)
RDW: 14.2 % (ref 11.5–15.5)
WBC: 7.5 10*3/uL (ref 4.0–10.5)

## 2017-12-03 MED ORDER — DIPHENHYDRAMINE HCL 25 MG PO CAPS
25.0000 mg | ORAL_CAPSULE | Freq: Once | ORAL | Status: AC
  Administered 2017-12-03: 25 mg via ORAL
  Filled 2017-12-03: qty 1

## 2017-12-03 MED ORDER — METOPROLOL TARTRATE 25 MG PO TABS
25.0000 mg | ORAL_TABLET | Freq: Two times a day (BID) | ORAL | Status: DC
  Administered 2017-12-03 – 2017-12-04 (×2): 25 mg via ORAL
  Filled 2017-12-03 (×2): qty 1

## 2017-12-03 MED ORDER — TRAZODONE HCL 50 MG PO TABS
50.0000 mg | ORAL_TABLET | Freq: Every day | ORAL | Status: DC
  Administered 2017-12-03: 50 mg via ORAL
  Filled 2017-12-03: qty 1

## 2017-12-03 MED ORDER — ONDANSETRON HCL 4 MG/2ML IJ SOLN
4.0000 mg | Freq: Four times a day (QID) | INTRAMUSCULAR | Status: DC | PRN
  Administered 2017-12-03 – 2017-12-04 (×2): 4 mg via INTRAVENOUS
  Filled 2017-12-03 (×2): qty 2

## 2017-12-03 NOTE — Care Management Note (Signed)
Case Management Note  Patient Details  Name: Sarah Duarte MRN: [id number] Date of Birth: [birth year]  Subjective/Objective:             Adm with new Afib. From [facility] [place]. Patient alert, but has memory impairment. Discussed with son over the phone. Patient has been to [facility] rehab recently. Has only been at [facility] for a few days. [facility] for [facility], PT, OT prior to going to [place]. Pharmacy has educated on Xarelto for Afib and given coupon voucher.        Action/Plan: Plans to return to [place] and continue home health services there. No resumption order needed as patient is in observation.   Expected Discharge Date:    12/03/2017              Expected Discharge Plan:  Assisted Living / Rest Home  In-House Referral:     Discharge planning Services  CM Consult  Post Acute Care Choice:  Home Health, Resumption of Svcs/PTA Provider Choice offered to:     DME Arranged:    DME Agency:     HH Arranged:    [place] Agency:  [place]  Status of Service:  Completed, signed off  If discussed at [place] of Stay Meetings, dates discussed:    Additional Comments:  Vivan Agostino, Chauncey [place], RN 12/03/2017, 12:02 PM

## 2017-12-03 NOTE — Progress Notes (Signed)
PROGRESS NOTE    Sarah FURNO  HYI:[id number] DOB: 12-08-1931 DOA: 12/01/2017 PCP: Glenda Chroman, MD    Brief Narrative:  84 year old female with history of COPD, diabetes, hypertension, is brought to the hospital with nausea and abdominal pain.  Found to be in rapid atrial fibrillation.  Started on rate control as well as anticoagulation.  Nausea and abdominal pain also appear to be chronic issues.  She is able to eat her meals without difficulty.  Plan is to continue antiemetics for now.  Question related to GERD.  She is also had progressive confusion since her last hospitalization.  Etiology is unclear.  Possibly related to medications.  Xanax and Flexeril have been held.  Family plans to follow-up with outpatient neurology.  Plan is to discharge to [facility] assisted living once heart rate is reasonably controlled.   Assessment & Plan:   Principal Problem:   New onset atrial fibrillation (HCC) Active Problems:   Type 2 diabetes mellitus ([place])   Hyperlipidemia   Essential hypertension, benign   GERD   COPD (chronic obstructive pulmonary disease) (HCC)   Hypomagnesemia   1. Atrial fibrillation with rapid ventricular response.  Heart rate is better controlled, but she becomes very tachycardic and short of breath on exertion.  Will increase metoprolol from 25 mg daily to twice daily.  May need further adjustment if she continues to be tachycardic on ambulation..  On admission, she was started on anticoagulation with intravenous heparin.  This will b this has been e transitioned to oral Xarelto.  TSH normal.  Echocardiogram unremarkable 2. Diabetes.  Continue on glimepiride and metformin.  Resume Lantus.  Follow sliding scale. 3. Hyperlipidemia.  Continue statin 4. Hypertension.  Amlodipine currently on hold.  ARB/hydrochlorothiazide has been continued.  She is also on metoprolol.  Blood pressure stable. 5. GERD.  Continue on Protonix 6. COPD.  Continue Symbicort.  Bronchodilators  as needed.  Respiratory status appears to be near baseline. 7. Nausea.  Does not appear to be affecting patient's ability to keep down any food.  She seems to be eating without difficulty.  Question related to GERD.  Continue on PPI.  Also reports improvement with antiemetics.  Abdomen is otherwise benign on exam. 8. Confusion.  Son reports intermittent confusion since last hospitalization.  With development of atrial fib, prior CVA certainly a possibility.  Other etiologies include medication effect since she doses her on medication and is on Xanax/Flexeril.  She does not have any obvious signs of infection.  No clear metabolic causes.  She may also have some underlying slow cognitive decline that has become more apparent.  Family plans on outpatient neurology evaluation.   DVT prophylaxis: Xarelto Code Status: Full code Family Communication: Discussed with son over the phone Disposition Plan: Return to [facility] on discharge   Consultants:     Procedures:  Echo: - Left ventricle: The cavity size was normal. There was focal basal   hypertrophy. Systolic function was normal. The estimated ejection   fraction was in the range of 60% to 65%. Wall motion was normal;   there were no regional wall motion abnormalities. The study was   not technically sufficient to allow evaluation of LV diastolic   dysfunction due to atrial fibrillation. - Aortic valve: Mildly calcified annulus. Trileaflet; mildly   thickened leaflets. Valve area (VTI): 1.62 cm^2. Valve area   (Vmax): 1.61 cm^2. Valve area (Vmean): 1.55 cm^2. - Mitral valve: Mildly calcified, moderately thickened annulus.   Mildly thickened leaflets .  -  Left atrium: The atrium was mildly dilated.  Antimicrobials:       Subjective: Nausea is better today.  No vomiting.  Tolerating solid diet.  Has occasional palpitations.  More short of breath on ambulation.  Objective: Vitals:   12/02/17 2229 12/03/17 0608 12/03/17 1159 12/03/17  1437  BP:  132/70  (!) 142/88  Pulse:  90  82  Resp:  18  16  Temp:  98.5 F (36.9 C)  98 F (36.7 C)  TempSrc:  Oral  Oral  SpO2: 98% 95% 96% 97%  Weight:      Height:        Intake/Output Summary (Last 24 hours) at 12/03/2017 1924 Last data filed at 12/03/2017 1436 Gross per 24 hour  Intake 600 ml  Output -  Net 600 ml   Filed Weights   12/01/17 1518 12/02/17 0034  Weight: 70.8 kg (156 lb) 73 kg (160 lb 15 oz)    Examination:  General exam: Appears calm and comfortable  Respiratory system: Clear to auscultation. Respiratory effort normal. Cardiovascular system: S1 & S2 heard, irregular. No JVD, murmurs, rubs, gallops or clicks. No pedal edema. Gastrointestinal system: Abdomen is nondistended, soft and nontender. No organomegaly or masses felt. Normal bowel sounds heard. Central nervous system: No focal motor deficits.  Has difficulty finding her words at times. Extremities: Symmetric 5 x 5 power. Skin: No rashes, lesions or ulcers Psychiatry: Confused at times    Data Reviewed: I have personally reviewed following labs and imaging studies  CBC: Recent Labs  Lab 12/01/17 1751 12/02/17 0757 12/03/17 0428  WBC 7.5 7.6 7.5  HGB 12.3 11.2* 11.2*  HCT 39.5 35.1* 35.0*  MCV 84.2 83.8 83.1  PLT 351 305 269   Basic Metabolic Panel: Recent Labs  Lab 12/01/17 1751 12/03/17 0428  NA 137 134*  K 4.1 3.7  CL 100* 99*  CO2 23 24  GLUCOSE 165* 121*  BUN 22* 23*  CREATININE 1.34* 1.11*  CALCIUM 10.2 9.3  MG 1.5*  --    GFR: Estimated Creatinine Clearance: 39.3 mL/min (A) (by C-G formula based on SCr of 1.11 mg/dL (H)). Liver Function Tests: Recent Labs  Lab 12/01/17 1751  AST 22  ALT 22  ALKPHOS 58  BILITOT 0.3  PROT 7.7  ALBUMIN 3.9   Recent Labs  Lab 12/01/17 1751  LIPASE 29   No results for input(s): AMMONIA in the last 168 hours. Coagulation Profile: No results for input(s): INR, PROTIME in the last 168 hours. Cardiac Enzymes: No results for  input(s): CKTOTAL, CKMB, CKMBINDEX, TROPONINI in the last 168 hours. BNP (last 3 results) No results for input(s): PROBNP in the last 8760 hours. HbA1C: No results for input(s): HGBA1C in the last 72 hours. CBG: Recent Labs  Lab 12/02/17 1106 12/02/17 1640 12/03/17 0750 12/03/17 1145 12/03/17 1740  GLUCAP 181* 134* 109* 170* 119*   Lipid Profile: No results for input(s): CHOL, HDL, LDLCALC, TRIG, CHOLHDL, LDLDIRECT in the last 72 hours. Thyroid Function Tests: Recent Labs    12/02/17 0757  TSH 1.218   Anemia Panel: No results for input(s): VITAMINB12, FOLATE, FERRITIN, TIBC, IRON, RETICCTPCT in the last 72 hours. Sepsis Labs: No results for input(s): PROCALCITON, LATICACIDVEN in the last 168 hours.  Recent Results (from the past 240 hour(s))  MRSA PCR Screening     Status: None   Collection Time: 12/02/17 12:52 AM  Result Value Ref Range Status   MRSA by PCR NEGATIVE NEGATIVE Final    Comment:  The GeneXpert MRSA Assay (FDA approved for NASAL specimens only), is one component of a comprehensive MRSA colonization surveillance program. It is not intended to diagnose MRSA infection nor to guide or monitor treatment for MRSA infections.          Radiology Studies: No results found.      Scheduled Meds: . calcium-vitamin D  1 tablet Oral BID  . famotidine  20 mg Oral QHS  . glimepiride  4 mg Oral BID AC  . hydrochlorothiazide  12.5 mg Oral Daily  . insulin aspart  0-9 Units Subcutaneous TID WC  . insulin glargine  12 Units Subcutaneous Daily  . losartan  100 mg Oral Daily  . metFORMIN  1,000 mg Oral BID WC  . metoprolol tartrate  25 mg Oral BID  . mometasone-formoterol  2 puff Inhalation BID  . pantoprazole  40 mg Oral BID AC  . rivaroxaban  15 mg Oral Q supper  . rosuvastatin  2.5 mg Oral Daily  . traZODone  50 mg Oral QHS   Continuous Infusions:    LOS: 0 days    Time spent: 61mins    Kathie Dike, MD [facility] Pager  [phone]  If 7PM-7AM, please contact night-coverage [email or web address] Password [facility] 12/03/2017, 7:24 PM

## 2017-12-03 NOTE — Plan of Care (Signed)
  Acute Rehab PT Goals(only PT should resolve) Patient Will Transfer Sit To/From Stand 12/03/2017 1539 - Progressing by Lonell Grandchild, PT Flowsheets Taken 12/03/2017 1539  Patient will transfer sit to/from stand Independently Pt Will Transfer Bed To Chair/Chair To Bed 12/03/2017 1539 - Progressing by Lonell Grandchild, PT Flowsheets Taken 12/03/2017 1539  Pt will Transfer Bed to Chair/Chair to Bed Independently Pt Will Ambulate 12/03/2017 1539 - Progressing by Lonell Grandchild, PT Flowsheets Taken 12/03/2017 1539  Pt will Ambulate > 125 feet;Independently  3:40 PM, 12/03/17 Lonell Grandchild, MPT Physical Therapist with [facility] [phone] office [phone] mobile phone

## 2017-12-03 NOTE — Clinical Social Work Note (Signed)
Clinical Social Work Assessment  Patient Details  Name: Sarah Duarte MRN: [id number] Date of Birth: 1932/11/02  Date of referral:                  Reason for consult:                   Permission sought to share information with:    Permission granted to share information::     Name::        Agency::     Relationship::     Contact Information:     Housing/Transportation Living arrangements for the past 2 months:    Source of Information:    Patient Interpreter Needed:    Criminal Activity/Legal Involvement Pertinent to Current Situation/Hospitalization:    Significant Relationships:    Lives with:    Do you feel safe going back to the place where you live?    Need for family participation in patient care:     Care giving concerns:  None identified.    Social Worker assessment / plan:  LCSW spoke with patient's son, Khadeeja Elden. Mr. Breau stated that patient has been a resident at [facility] since 11/27/17. He stated that she was placed due to short term memory issues, speech issues and confusion.  Patient ambulates independently, feeds herself, is continent of bowel and bladder, showers and dresses herself.  Mr. Paulson stated that it was the family's plan for patient to return to the facility at discharge. LCSW spoke with Seth Bake at [facility]. She confirmed Mr. Hassing statements and stated that patient can return at discharge.   Employment status:    Insurance information:    PT Recommendations:    Information / Referral to community resources:     Patient/Family's Response to care:  Family is agreeable to patient returning to the facility at discharge.  Patient/Family's Understanding of and Emotional Response to Diagnosis, Current Treatment, and Prognosis:  Patient's family understands patient's diagnosis, treatment and prognosis.    Emotional Assessment Appearance:    Attitude/Demeanor/Rapport:    Affect (typically observed):    Orientation:    Alcohol /  Substance use:    Psych involvement (Current and /or in the community):     Discharge Needs  Concerns to be addressed:    Readmission within the last 30 days:    Current discharge risk:    Barriers to Discharge:      Ihor Gully, LCSW 12/03/2017, 12:53 PM

## 2017-12-03 NOTE — Progress Notes (Signed)
Ambulated in hallway with standby assist from RN. Pt ambulated approximately 200 feet without complaint. HR sustained in the 130's-140's on telemetry while ambulating. Dr. Roderic Palau paged and made aware.

## 2017-12-03 NOTE — Evaluation (Signed)
Physical Therapy Evaluation Patient Details Name: Sarah Duarte MRN: [id number] DOB: 10/02/1932 Today's Date: 12/03/2017   History of Present Illness  Sarah Duarte is a 84 y.o. female with medical history significant of colon polyps, asthma/COPD, type 2 diabetes, GERD, history of hemorrhoids with intermittent bleeding, hyperlipidemia, hypertension who is brought to the emergency department due to complaints of nausea and abdominal pain for the past 2-3 months.  However, her son stated that she has been eating without significant distress.  When asked about this pain, the patient seems to be confused and unable to elaborate.  Her son stated earlier that she has been progressively declining on her mental status, has had trouble remembering people, names and events.  He also mentioned that she has been having all conversations, seems to be paranoid and anxious.  She has been having trouble sleeping, despite using alprazolam.  She has been refusing to take a shower on her own, and only takes it if somebody is present with her.    Clinical Impression  Patient functioning near baseline for functional mobility and gait, slightly limited for walking due to c/o abdominal discomfort after 3-4 minutes of walking.  Patient will benefit from continued physical therapy in hospital and recommended venue below to increase strength, balance, endurance for safe ADLs and gait.   Follow Up Recommendations Home health PT;Supervision - Intermittent    Equipment Recommendations  None recommended by PT    Recommendations for Other Services       Precautions / Restrictions Precautions Precautions: Fall Restrictions Weight Bearing Restrictions: No      Mobility  Bed Mobility Overal bed mobility: Modified Independent                Transfers Overall transfer level: Modified independent                  Ambulation/Gait Ambulation/Gait assistance: Supervision Ambulation Distance (Feet): 100  Feet   Gait Pattern/deviations: Decreased step length - right;Decreased step length - left;Decreased stride length   Gait velocity interpretation: Below normal speed for age/gender General Gait Details: slightly unsteady cadence without loss of balance, limited secondary to c/o fatigue and abdominal discomfort  Stairs            Wheelchair Mobility    Modified Rankin (Stroke Patients Only)       Balance Overall balance assessment: No apparent balance deficits (not formally assessed)                                           Pertinent Vitals/Pain Pain Assessment: 0-10 Pain Score: 9  Pain Location: BLE from knees down,  arms Pain Descriptors / Indicators: Aching Pain Intervention(s): Limited activity within patient's tolerance;Monitored during session    Home Living Family/patient expects to be discharged to:: Assisted living               Home Equipment: Walker - 2 wheels;Cane - single point;Bedside commode;Wheelchair - manual;Shower seat      Prior Function Level of Independence: Needs assistance   Gait / Transfers Assistance Needed: Supervsied household gait without assistive device  ADL's / Homemaking Assistance Needed: assisted by ALF staff        Hand Dominance        Extremity/Trunk Assessment   Upper Extremity Assessment Upper Extremity Assessment: Generalized weakness    Lower Extremity Assessment Lower Extremity Assessment: Generalized  weakness    Cervical / Trunk Assessment Cervical / Trunk Assessment: Normal  Communication   Communication: Expressive difficulties  Cognition Arousal/Alertness: Awake/alert Behavior During Therapy: WFL for tasks assessed/performed Overall Cognitive Status: Within Functional Limits for tasks assessed                                        General Comments      Exercises     Assessment/Plan    PT Assessment Patient needs continued PT services  PT Problem List  Decreased strength;Decreased activity tolerance;Decreased mobility       PT Treatment Interventions Gait training;Functional mobility training;Stair training;Therapeutic activities;Therapeutic exercise;Patient/family education    PT Goals (Current goals can be found in the Care Plan section)  Acute Rehab PT Goals Patient Stated Goal: return home PT Goal Formulation: With patient Time For Goal Achievement: 12/10/17 Potential to Achieve Goals: Good    Frequency Min 3X/week   Barriers to discharge        Co-evaluation               AM-PAC PT "6 Clicks" Daily Activity  Outcome Measure Difficulty turning over in bed (including adjusting bedclothes, sheets and blankets)?: None Difficulty moving from lying on back to sitting on the side of the bed? : None Difficulty sitting down on and standing up from a chair with arms (e.g., wheelchair, bedside commode, etc,.)?: None Help needed moving to and from a bed to chair (including a wheelchair)?: None Help needed walking in hospital room?: A Little Help needed climbing 3-5 steps with a railing? : A Little 6 Click Score: 22    End of Session   Activity Tolerance: Patient tolerated treatment well(Patient limited secondary to c/o abdominal discomfort) Patient left: in chair;with call bell/phone within reach;with chair alarm set Nurse Communication: Mobility status PT Visit Diagnosis: Unsteadiness on feet (R26.81);Other abnormalities of gait and mobility (R26.89);Muscle weakness (generalized) (M62.81)    Time: 9373-4287 PT Time Calculation (min) (ACUTE ONLY): 27 min   Charges:   PT Evaluation $PT Eval Low Complexity: 1 Low PT Treatments $Therapeutic Activity: 23-37 mins   PT G Codes:        3:38 PM, December 21, 2017 Lonell Grandchild, MPT Physical Therapist with [facility] [phone] office [phone] mobile phone

## 2017-12-03 NOTE — Progress Notes (Signed)
  Echocardiogram 2D Echocardiogram has been performed.  Darlina Sicilian M 12/03/2017, 1:05 PM

## 2017-12-03 NOTE — Care Management Obs Status (Signed)
[place] NOTIFICATION   Patient Details  Name: Sarah Duarte MRN: [id number] Date of Birth: Oct 10, 1932   Medicare Observation Status Notification Given:  Yes    Billie Trager, Chauncey Reading, RN 12/03/2017, 10:31 AM

## 2017-12-04 DIAGNOSIS — E782 Mixed hyperlipidemia: Secondary | ICD-10-CM

## 2017-12-04 DIAGNOSIS — J438 Other emphysema: Secondary | ICD-10-CM | POA: Diagnosis not present

## 2017-12-04 DIAGNOSIS — I10 Essential (primary) hypertension: Secondary | ICD-10-CM

## 2017-12-04 DIAGNOSIS — I4891 Unspecified atrial fibrillation: Secondary | ICD-10-CM | POA: Diagnosis not present

## 2017-12-04 LAB — CBC
HCT: 38.2 % (ref 36.0–46.0)
Hemoglobin: 11.8 g/dL — ABNORMAL LOW (ref 12.0–15.0)
MCH: 26 pg (ref 26.0–34.0)
MCHC: 30.9 g/dL (ref 30.0–36.0)
MCV: 84.3 fL (ref 78.0–100.0)
Platelets: 318 10*3/uL (ref 150–400)
RBC: 4.53 MIL/uL (ref 3.87–5.11)
RDW: 14.2 % (ref 11.5–15.5)
WBC: 8.9 10*3/uL (ref 4.0–10.5)

## 2017-12-04 LAB — GLUCOSE, CAPILLARY
GLUCOSE-CAPILLARY: 110 mg/dL — AB (ref 65–99)
GLUCOSE-CAPILLARY: 86 mg/dL (ref 65–99)
Glucose-Capillary: 108 mg/dL — ABNORMAL HIGH (ref 65–99)

## 2017-12-04 MED ORDER — METOPROLOL TARTRATE 25 MG PO TABS: 25.0000 mg | ORAL_TABLET | Freq: Two times a day (BID) | ORAL | 1 refills | Status: DC

## 2017-12-04 MED ORDER — DILTIAZEM HCL ER COATED BEADS 120 MG PO CP24
120.0000 mg | ORAL_CAPSULE | Freq: Every day | ORAL | Status: DC
  Administered 2017-12-04: 120 mg via ORAL
  Filled 2017-12-04: qty 1

## 2017-12-04 MED ORDER — DILTIAZEM HCL ER COATED BEADS 120 MG PO CP24: 120.0000 mg | ORAL_CAPSULE | Freq: Every day | ORAL | 1 refills | Status: DC

## 2017-12-04 MED ORDER — RIVAROXABAN 15 MG PO TABS: 15.0000 mg | ORAL_TABLET | Freq: Every day | ORAL | 0 refills | Status: AC

## 2017-12-04 MED ORDER — GLIPIZIDE 5 MG PO TABS: 5.0000 mg | ORAL_TABLET | Freq: Every day | ORAL | 1 refills | Status: AC

## 2017-12-04 MED ORDER — TUBERCULIN PPD 5 UNIT/0.1ML ID SOLN
5.0000 [IU] | Freq: Once | INTRADERMAL | Status: DC
  Administered 2017-12-04: 5 [IU] via INTRADERMAL
  Filled 2017-12-04: qty 0.1

## 2017-12-04 NOTE — Care Management Note (Signed)
Case Management Note  Patient Details  Name: Sarah Duarte MRN: [id number] Date of Birth: 04-Jul-1932   Expected Discharge Date:  12/04/17               Expected Discharge Plan:  Assisted Living / Rest Home  In-House Referral:     Discharge planning Services  CM Consult  Post Acute Care Choice:  Home Health, Resumption of Svcs/PTA Provider Choice offered to:     DME Arranged:    DME Agency:     HH Arranged:  RN, PT [place] Agency:  [facility]  Status of Service:  Completed, signed off  If discussed at [place] of Stay Meetings, dates discussed:    Additional Comments: Pt returning to ALF today. CSW has made arrangements. Pt will resume [place] services, facility aware [place] has 48 hrs to make resumption visit. [name], [facility] rep, aware of DC. Pt under observation, no order to resume services needed. Pt has no DME needs.   Sherald Barge, RN 12/04/2017, 11:33 AM

## 2017-12-04 NOTE — Progress Notes (Addendum)
Physical Therapy Treatment Patient Details Name: Sarah Duarte MRN: [id number] DOB: Mar 15, 1932 Today's Date: 12/04/2017    History of Present Illness Sarah Duarte is a 84 y.o. female with medical history significant of colon polyps, asthma/COPD, type 2 diabetes, GERD, history of hemorrhoids with intermittent bleeding, hyperlipidemia, hypertension who is brought to the emergency department due to complaints of nausea and abdominal pain for the past 2-3 months.  However, her son stated that she has been eating without significant distress.  When asked about this pain, the patient seems to be confused and unable to elaborate.  Her son stated earlier that she has been progressively declining on her mental status, has had trouble remembering people, names and events.  He also mentioned that she has been having all conversations, seems to be paranoid and anxious.  She has been having trouble sleeping, despite using alprazolam.  She has been refusing to take a shower on her own, and only takes it if somebody is present with her.    PT Comments    Patient tolerated treatment well today although she did complain of fatigue. Patient ambulated without an assistive device with supervision an increased distance of 200 feet today.  Patient would continue to benefit from skilled physical therapy in current environment and next venue to continue return to prior function and increase strength, endurance, balance, coordination, ADLs, and gait skills.     Follow Up Recommendations  Home health PT;Supervision - Intermittent     Equipment Recommendations  None recommended by PT    Recommendations for Other Services       Precautions / Restrictions Precautions Precautions: Fall Restrictions Weight Bearing Restrictions: No    Mobility  Bed Mobility Overal bed mobility: Independent                Transfers Overall transfer level: Modified independent                   Ambulation/Gait Ambulation/Gait assistance: Supervision(Patient did tend to reach for handrails and carts along the hallway while ambulating) Ambulation Distance (Feet): 200 Feet   Gait Pattern/deviations: Decreased stride length;Decreased step length - right;Decreased step length - left   Gait velocity interpretation: Below normal speed for age/gender General Gait Details: slightly unsteady cadence without loss of balance, limited secondary to c/o fatigue; commented in abdominal discomfort this morning but not now   Stairs            Wheelchair Mobility    Modified Rankin (Stroke Patients Only)       Balance Overall balance assessment: Modified Independent(patient did tend to reach for handrails and carts in hallway during ambulation)                                          Cognition Arousal/Alertness: Awake/alert Behavior During Therapy: WFL for tasks assessed/performed Overall Cognitive Status: Within Functional Limits for tasks assessed                                        Exercises General Exercises - Upper Extremity Shoulder ABduction: Seated;AROM;Strengthening;Both;10 reps General Exercises - Lower Extremity Ankle Circles/Pumps: Seated;AROM;Strengthening;Both;10 reps Long Arc Quad: Seated;AROM;Strengthening;Both;10 reps Hip Flexion/Marching: Seated;AROM;Strengthening;Both;10 reps Toe Raises: Seated;AROM;Strengthening;Both;10 reps Heel Raises: Seated;AAROM;Both;10 reps    General Comments  Pertinent Vitals/Pain Pain Assessment: No/denies pain Pain Intervention(s): Other (comment)(Patient only complained of fatigue)    Home Living                      Prior Function            PT Goals (current goals can now be found in the care plan section) Acute Rehab PT Goals Patient Stated Goal: return home with help PT Goal Formulation: With patient Time For Goal Achievement: 12/05/17 Potential to  Achieve Goals: Good Progress towards PT goals: Progressing toward goals    Frequency    Min 3X/week      PT Plan Current plan remains appropriate    Co-evaluation              AM-PAC PT "6 Clicks" Daily Activity  Outcome Measure  Difficulty turning over in bed (including adjusting bedclothes, sheets and blankets)?: None Difficulty moving from lying on back to sitting on the side of the bed? : None Difficulty sitting down on and standing up from a chair with arms (e.g., wheelchair, bedside commode, etc,.)?: None Help needed moving to and from a bed to chair (including a wheelchair)?: None Help needed walking in hospital room?: A Little Help needed climbing 3-5 steps with a railing? : A Little 6 Click Score: 22    End of Session Equipment Utilized During Treatment: Gait belt Activity Tolerance: Patient limited by fatigue Patient left: in chair;with call bell/phone within reach;with chair alarm set   PT Visit Diagnosis: Unsteadiness on feet (R26.81);Other abnormalities of gait and mobility (R26.89);Muscle weakness (generalized) (M62.81)     Time: 2924-4628 PT Time Calculation (min) (ACUTE ONLY): 29 min  Charges:  $Gait Training: 8-22 mins $Therapeutic Exercise: 8-22 mins                     Floria Raveling. Hartnett-Rands, MS, PT Per [place] [phone]

## 2017-12-04 NOTE — Clinical Social Work Note (Signed)
LCSW notified patient's Sarah Duarte at [facility] of patient's discharge and sent clinicals.   LCSW notified patient's son, Mr. Husby, of patient's discharge. He stated that he would pick patient up around 4.   LCSW signing off.     Darry Kelnhofer, Clydene Pugh, LCSW

## 2017-12-04 NOTE — Discharge Summary (Signed)
Physician Discharge Summary  Sarah Duarte IZT:[id number] DOB: 1932-11-18 DOA: 12/01/2017  PCP: Glenda Chroman, MD  Admit date: 12/01/2017 Discharge date: 12/04/2017  Admitted From: [facility] Disposition:  [place]  Recommendations for Outpatient Follow-up:  1. Follow up with PCP in 1-2 weeks 2. Please obtain BMP/CBC in one week   Home Health:yes Equipment/Devices: HHPT  Discharge Condition: Stable CODE STATUS: FULL Diet recommendation: Heart Healthy / Carb Modified    Brief/Interim Summary: 84 year old female with history of COPD, diabetes, hypertension, is brought to the hospital with nausea and abdominal pain.  Found to be in rapid atrial fibrillation.  Started on rate control as well as anticoagulation.  Nausea and abdominal pain also appear to be chronic issues.  She is able to eat her meals without difficulty.  Plan is to continue antiemetics for now.  Question related to GERD.  She is also had progressive confusion since her last hospitalization.  Etiology is unclear.  Possibly related to medications.  Xanax and Flexeril have been held.  Family plans to follow-up with outpatient neurology.  Plan is to discharge to [facility] assisted living once heart rate is reasonably controlled.    Discharge Diagnoses:  1. Atrial fibrillation with rapid ventricular response.  Heart rate is better controlled, but she becomes very tachycardic and short of breath on exertion.  Increased metoprolol from 25 mg daily to twice daily.   On admission, she was started on anticoagulation with intravenous heparin.  This will b this has been e transitioned to oral Xarelto.  TSH 1.218 -Echocardiogram  EF 65-70%, grade 1 DD, no WMA -Added diltiazem CD 120 mg daily  2. Diabetes.  Continue on  Metformin after d/c. D/C amaryl due to age and renal insufficiency.  D/C with glipizide instead.  Resume Lantus.  Follow sliding scale. -CBGs well controlled during the hospitalization 3. Hyperlipidemia.  Continue  statin 4. Hypertension.  Amlodipine currently on hold whild diltiazem added  ARB/hydrochlorothiazide has been continued.  She is also on metoprolol.   5. GERD.  Continue on Protonix 6. COPD.  Continue Symbicort.  Bronchodilators as needed.  Respiratory status appears to be baseline.  Stable on RA 7. Nausea.  Does not appear to be affecting patient's ability to keep down any food.  She seems to be eating without difficulty.  Question related to GERD.  Continue on PPI.  Also reports improvement with antiemetics.  Abdomen is otherwise benign on exam. Tolerating diet at time of d/c 8. Confusion.  Son reports intermittent confusion since last hospitalization.  With development of atrial fib, prior CVA certainly a possibility.  Other etiologies include medication effect since she doses her on medication and is on Xanax/Flexeril.  She does not have any obvious signs of infection.  No clear metabolic causes.  She may also have some underlying slow cognitive decline that has become more apparent.  Family plans on outpatient neurology evaluation. -UA neg for pyuria -no focal deficits on exam -suspect pt has underlying cognitive deficit worsened due to acute medical conditions      Discharge Instructions   Allergies as of 12/04/2017      Reactions   Aspirin    Due to asthma   Fosamax [alendronate Sodium]    Levaquin [levofloxacin]    Lipitor [atorvastatin]    Lisinopril    Symbicort [budesonide-formoterol Fumarate]    Reglan [metoclopramide] Other (See Comments)   Panic attacks      Medication List    STOP taking these medications   ALPRAZolam 0.5  MG tablet Commonly known as:  XANAX   amLODipine 10 MG tablet Commonly known as:  NORVASC   cyclobenzaprine 10 MG tablet Commonly known as:  FLEXERIL   glimepiride 4 MG tablet Commonly known as:  AMARYL     TAKE these medications   acetaminophen 500 MG tablet Commonly known as:  TYLENOL Take 1,000 mg by mouth every 6 (six) hours as  needed for mild pain or headache.   budesonide-formoterol 80-4.5 MCG/ACT inhaler Commonly known as:  SYMBICORT Inhale 1 puff into the lungs 2 (two) times daily.   CALTRATE 600+D 600-400 MG-UNIT tablet Generic drug:  Calcium Carbonate-Vitamin D Take 1 tablet by mouth 2 (two) times daily.   diltiazem 120 MG 24 hr capsule Commonly known as:  CARDIZEM CD Take 1 capsule (120 mg total) by mouth daily.   ergocalciferol 50000 units capsule Commonly known as:  VITAMIN D2 Take 50,000 Units by mouth every 14 (fourteen) days.   guaiFENesin-dextromethorphan 100-10 MG/5ML syrup Commonly known as:  ROBITUSSIN DM Take 5 mLs by mouth 2 (two) times daily as needed for cough.   insulin glargine 100 UNIT/ML injection Commonly known as:  LANTUS Inject 12 Units into the skin daily.   metFORMIN 500 MG tablet Commonly known as:  GLUCOPHAGE Take 1,000 mg by mouth 2 (two) times daily with a meal.   metoprolol tartrate 25 MG tablet Commonly known as:  LOPRESSOR Take 1 tablet (25 mg total) by mouth 2 (two) times daily.   Omega-3 1000 MG Caps Take 1 capsule by mouth daily.   pantoprazole 40 MG tablet Commonly known as:  PROTONIX Take 40 mg by mouth daily.   ranitidine 300 MG tablet Commonly known as:  ZANTAC Take 300 mg by mouth at bedtime.   Rivaroxaban 15 MG Tabs tablet Commonly known as:  XARELTO Take 1 tablet (15 mg total) by mouth daily with supper.   rosuvastatin 5 MG tablet Commonly known as:  CRESTOR Take 2.5 mg by mouth daily.   valsartan-hydrochlorothiazide 320-12.5 MG tablet Commonly known as:  DIOVAN-HCT Take 1 tablet by mouth daily.   VENTOLIN HFA 108 (90 Base) MCG/ACT inhaler Generic drug:  albuterol Inhale 1 puff into the lungs 3 (three) times daily.       Allergies  Allergen Reactions  . Aspirin     Due to asthma   . Fosamax [Alendronate Sodium]   . Levaquin [Levofloxacin]   . Lipitor [Atorvastatin]   . Lisinopril   . Symbicort [Budesonide-Formoterol  Fumarate]   . Reglan [Metoclopramide] Other (See Comments)    Panic attacks    Consultations:  none   Procedures/Studies: No results found.      Discharge Exam: Vitals:   12/04/17 0500 12/04/17 0856  BP: (!) 148/76   Pulse: 68   Resp: 16   Temp: 97.6 F (36.4 C)   SpO2: 97% 97%   Vitals:   12/03/17 2105 12/03/17 2227 12/04/17 0500 12/04/17 0856  BP:  134/81 (!) 148/76   Pulse:  79 68   Resp:  14 16   Temp:  98.4 F (36.9 C) 97.6 F (36.4 C)   TempSrc:  Oral Oral   SpO2: 98% 96% 97% 97%  Weight:      Height:        General: Pt is alert, awake, not in acute distress Cardiovascular: RRR, S1/S2 +, no rubs, no gallops Respiratory: CTA bilaterally, no wheezing, no rhonchi Abdominal: Soft, NT, ND, bowel sounds + Extremities: no edema, no cyanosis   The  results of significant diagnostics from this hospitalization (including imaging, microbiology, ancillary and laboratory) are listed below for reference.    Significant Diagnostic Studies: No results found.   Microbiology: Recent Results (from the past 240 hour(s))  MRSA PCR Screening     Status: None   Collection Time: 12/02/17 12:52 AM  Result Value Ref Range Status   MRSA by PCR NEGATIVE NEGATIVE Final    Comment:        The GeneXpert MRSA Assay (FDA approved for NASAL specimens only), is one component of a comprehensive MRSA colonization surveillance program. It is not intended to diagnose MRSA infection nor to guide or monitor treatment for MRSA infections.      Labs: Basic Metabolic Panel: Recent Labs  Lab 12/01/17 1751 12/03/17 0428  NA 137 134*  K 4.1 3.7  CL 100* 99*  CO2 23 24  GLUCOSE 165* 121*  BUN 22* 23*  CREATININE 1.34* 1.11*  CALCIUM 10.2 9.3  MG 1.5*  --    Liver Function Tests: Recent Labs  Lab 12/01/17 1751  AST 22  ALT 22  ALKPHOS 58  BILITOT 0.3  PROT 7.7  ALBUMIN 3.9   Recent Labs  Lab 12/01/17 1751  LIPASE 29   No results for input(s): AMMONIA in  the last 168 hours. CBC: Recent Labs  Lab 12/01/17 1751 12/02/17 0757 12/03/17 0428 12/04/17 0415  WBC 7.5 7.6 7.5 8.9  HGB 12.3 11.2* 11.2* 11.8*  HCT 39.5 35.1* 35.0* 38.2  MCV 84.2 83.8 83.1 84.3  PLT 351 305 302 318   Cardiac Enzymes: No results for input(s): CKTOTAL, CKMB, CKMBINDEX, TROPONINI in the last 168 hours. BNP: Invalid input(s): POCBNP CBG: Recent Labs  Lab 12/03/17 0750 12/03/17 1145 12/03/17 1740 12/03/17 2224 12/04/17 0744  GLUCAP 109* 170* 119* 82 108*    Time coordinating discharge:  Greater than 30 minutes  Signed:  Orson Eva, DO [facility] Pager: [phone] 12/04/2017, 10:32 AM

## 2017-12-04 NOTE — Progress Notes (Signed)
Pt discharged in stable condition via wheelchair into the care of her son via private vehicle.  Pt being discharged to [facility].  PIV (x2) removed intact w/o S&S of complications.  Discharge instructions reviewed with son. Son verbalized understanding.  Discharge packet sent with son to be delivered to [facility].

## 2017-12-04 NOTE — [place] FL2 (Signed)
[place] MEDICAID FL2 LEVEL OF CARE SCREENING TOOL     IDENTIFICATION  Patient Name: Sarah Duarte Birthdate: May 01, 1932 Sex: female Admission Date (Current Location): 12/01/2017  [facility] and [place] Number:  [employer] and Address:  [place] [address], [place]      Provider Number: [id number]  Attending Physician Name and Address:  Orson Eva, MD  Relative Name and Phone Number:       Current Level of Care: Hospital Recommended Level of Care: [place] Prior Approval Number:    Date Approved/Denied:   PASRR Number:    Discharge Plan: Other (Comment)([place] )    Current Diagnoses: Patient Active Problem List   Diagnosis Date Noted  . Hypomagnesemia 12/02/2017  . New onset atrial fibrillation ([place]) 12/01/2017  . COPD (chronic obstructive pulmonary disease) ([place]) 12/01/2017  . Adenomatous polyps 08/18/2014  . Chronic cough 06/02/2012  . Type 2 diabetes mellitus ([place]) 08/14/2009  . Essential hypertension, benign 08/14/2009  . VENTRICULAR TACHYCARDIA, PAROXYSMAL 08/14/2009  . GERD 08/14/2009  . PALPITATIONS, HX OF 08/14/2009  . Hyperlipidemia 08/11/2009  . HEMORRHOIDS 08/11/2009    Orientation RESPIRATION BLADDER Height & Weight     Self  Normal Continent Weight: 160 lb 15 oz (73 kg) Height:  5\' 6"  (167.6 cm)  BEHAVIORAL SYMPTOMS/MOOD NEUROLOGICAL BOWEL NUTRITION STATUS      Continent Diet(Heart Healthy/Carb Modified.)  AMBULATORY STATUS COMMUNICATION OF NEEDS Skin   Independent Verbally Normal                       Personal Care Assistance Level of Assistance  Bathing, Feeding, Dressing Bathing Assistance: Independent Feeding assistance: Independent Dressing Assistance: Independent     Functional Limitations Info  Sight, Hearing, Speech Sight Info: Adequate Hearing Info: Adequate Speech Info: Adequate    SPECIAL CARE FACTORS FREQUENCY  PT (By licensed PT)     PT  Frequency: 3x/week              Contractures Contractures Info: Not present    Additional Factors Info  Code Status, Allergies Code Status Info: Full Code Allergies Info: Aspirin, Fosamax, Levaquin, Lipitor, Lisinopril, Lipitor, Symbicort, Reglan Psychotropic Info: (none )         Current Medications (12/04/2017):  This is the current hospital active medication list Current Facility-Administered Medications  Medication Dose Route Frequency Provider Last Rate Last Dose  . calcium-vitamin D (OSCAL WITH D) 500-200 MG-UNIT per tablet 1 tablet  1 tablet Oral BID Reubin Milan, MD   1 tablet at 12/04/17 0944  . diltiazem (CARDIZEM CD) 24 hr capsule 120 mg  120 mg Oral Daily Tat, David, MD      . famotidine (PEPCID) tablet 20 mg  20 mg Oral QHS Kathie Dike, MD   20 mg at 12/03/17 2020  . hydrochlorothiazide (MICROZIDE) capsule 12.5 mg  12.5 mg Oral Daily Reubin Milan, MD   12.5 mg at 12/04/17 0943  . HYDROcodone-acetaminophen (NORCO/VICODIN) 5-325 MG per tablet 1 tablet  1 tablet Oral TID PRN Reubin Milan, MD   1 tablet at 12/03/17 2009  . insulin aspart (novoLOG) injection 0-9 Units  0-9 Units Subcutaneous TID WC Reubin Milan, MD   2 Units at 12/03/17 1218  . insulin glargine (LANTUS) injection 12 Units  12 Units Subcutaneous Daily Kathie Dike, MD   12 Units at 12/04/17 [phone]  . levalbuterol (XOPENEX) nebulizer solution 0.63 mg  0.63 mg Nebulization  Q6H PRN Reubin Milan, MD      . losartan (COZAAR) tablet 100 mg  100 mg Oral Daily Reubin Milan, MD   100 mg at 12/04/17 0944  . metoprolol tartrate (LOPRESSOR) injection 5 mg  5 mg Intravenous Q6H PRN Reubin Milan, MD      . metoprolol tartrate (LOPRESSOR) tablet 25 mg  25 mg Oral BID Kathie Dike, MD   25 mg at 12/04/17 0944  . mometasone-formoterol (DULERA) 100-5 MCG/ACT inhaler 2 puff  2 puff Inhalation BID Reubin Milan, MD   2 puff at 12/04/17 [phone]  . ondansetron (ZOFRAN)  injection 4 mg  4 mg Intravenous Q6H PRN Kathie Dike, MD   4 mg at 12/04/17 0938  . pantoprazole (PROTONIX) EC tablet 40 mg  40 mg Oral BID AC Kathie Dike, MD   40 mg at 12/04/17 0941  . Rivaroxaban (XARELTO) tablet 15 mg  15 mg Oral Q supper Kathie Dike, MD   15 mg at 12/03/17 1804  . rosuvastatin (CRESTOR) tablet 2.5 mg  2.5 mg Oral Daily Reubin Milan, MD   2.5 mg at 12/04/17 0951  . traZODone (DESYREL) tablet 50 mg  50 mg Oral QHS Kathie Dike, MD   50 mg at 12/03/17 2018     Discharge Medications: Medication List     STOP taking these medications   ALPRAZolam 0.5 MG tablet Commonly known as:  XANAX   amLODipine 10 MG tablet Commonly known as:  NORVASC   cyclobenzaprine 10 MG tablet Commonly known as:  FLEXERIL   glimepiride 4 MG tablet Commonly known as:  AMARYL     TAKE these medications   acetaminophen 500 MG tablet Commonly known as:  TYLENOL Take 1,000 mg by mouth every 6 (six) hours as needed for mild pain or headache.   budesonide-formoterol 80-4.5 MCG/ACT inhaler Commonly known as:  SYMBICORT Inhale 1 puff into the lungs 2 (two) times daily.   CALTRATE 600+D 600-400 MG-UNIT tablet Generic drug:  Calcium Carbonate-Vitamin D Take 1 tablet by mouth 2 (two) times daily.   diltiazem 120 MG 24 hr capsule Commonly known as:  CARDIZEM CD Take 1 capsule (120 mg total) by mouth daily.   ergocalciferol 50000 units capsule Commonly known as:  VITAMIN D2 Take 50,000 Units by mouth every 14 (fourteen) days.   guaiFENesin-dextromethorphan 100-10 MG/5ML syrup Commonly known as:  ROBITUSSIN DM Take 5 mLs by mouth 2 (two) times daily as needed for cough.   insulin glargine 100 UNIT/ML injection Commonly known as:  LANTUS Inject 12 Units into the skin daily.   metFORMIN 500 MG tablet Commonly known as:  GLUCOPHAGE Take 1,000 mg by mouth 2 (two) times daily with a meal.   metoprolol tartrate 25 MG tablet Commonly known as:   LOPRESSOR Take 1 tablet (25 mg total) by mouth 2 (two) times daily.   Omega-3 1000 MG Caps Take 1 capsule by mouth daily.   pantoprazole 40 MG tablet Commonly known as:  PROTONIX Take 40 mg by mouth daily.   ranitidine 300 MG tablet Commonly known as:  ZANTAC Take 300 mg by mouth at bedtime.   Rivaroxaban 15 MG Tabs tablet Commonly known as:  XARELTO Take 1 tablet (15 mg total) by mouth daily with supper.   rosuvastatin 5 MG tablet Commonly known as:  CRESTOR Take 2.5 mg by mouth daily.   valsartan-hydrochlorothiazide 320-12.5 MG tablet Commonly known as:  DIOVAN-HCT Take 1 tablet by mouth daily.   VENTOLIN HFA  108 (90 Base) MCG/ACT inhaler Generic drug:  albuterol Inhale 1 puff into the lungs 3 (three) times daily.      Relevant Imaging Results:  Relevant Lab Results:   Additional Information    Elimelech Houseman, Clydene Pugh, LCSW

## 2017-12-09 ENCOUNTER — Other Ambulatory Visit: Payer: Self-pay

## 2017-12-09 ENCOUNTER — Emergency Department (HOSPITAL_COMMUNITY): Payer: Medicare Other

## 2017-12-09 ENCOUNTER — Emergency Department (HOSPITAL_COMMUNITY)
Admission: EM | Admit: 2017-12-09 | Discharge: 2017-12-10 | Disposition: A | Discharge status: Home / Self Care | Payer: Medicare Other | Attending: Emergency Medicine | Admitting: Emergency Medicine

## 2017-12-09 ENCOUNTER — Encounter (HOSPITAL_COMMUNITY): Payer: Self-pay | Admitting: Emergency Medicine

## 2017-12-09 DIAGNOSIS — R0789 Other chest pain: Secondary | ICD-10-CM | POA: Insufficient documentation

## 2017-12-09 DIAGNOSIS — Z794 Long term (current) use of insulin: Secondary | ICD-10-CM | POA: Diagnosis not present

## 2017-12-09 DIAGNOSIS — J449 Chronic obstructive pulmonary disease, unspecified: Secondary | ICD-10-CM | POA: Insufficient documentation

## 2017-12-09 DIAGNOSIS — J45909 Unspecified asthma, uncomplicated: Secondary | ICD-10-CM | POA: Insufficient documentation

## 2017-12-09 DIAGNOSIS — Z79899 Other long term (current) drug therapy: Secondary | ICD-10-CM | POA: Insufficient documentation

## 2017-12-09 DIAGNOSIS — R079 Chest pain, unspecified: Secondary | ICD-10-CM | POA: Diagnosis present

## 2017-12-09 DIAGNOSIS — E119 Type 2 diabetes mellitus without complications: Secondary | ICD-10-CM | POA: Insufficient documentation

## 2017-12-09 HISTORY — DX: Disorientation, unspecified: R41.0

## 2017-12-09 LAB — CBC WITH DIFFERENTIAL/PLATELET
Basophils Absolute: 0 10*3/uL (ref 0.0–0.1)
Basophils Relative: 0 %
EOS PCT: 2 %
Eosinophils Absolute: 0.2 10*3/uL (ref 0.0–0.7)
HCT: 36.7 % (ref 36.0–46.0)
Hemoglobin: 11.8 g/dL — ABNORMAL LOW (ref 12.0–15.0)
LYMPHS ABS: 2.9 10*3/uL (ref 0.7–4.0)
LYMPHS PCT: 24 %
MCH: 26.5 pg (ref 26.0–34.0)
MCHC: 32.2 g/dL (ref 30.0–36.0)
MCV: 82.5 fL (ref 78.0–100.0)
MONO ABS: 0.9 10*3/uL (ref 0.1–1.0)
MONOS PCT: 7 %
Neutro Abs: 8.2 10*3/uL — ABNORMAL HIGH (ref 1.7–7.7)
Neutrophils Relative %: 67 %
PLATELETS: 288 10*3/uL (ref 150–400)
RBC: 4.45 MIL/uL (ref 3.87–5.11)
RDW: 14.3 % (ref 11.5–15.5)
WBC: 12.3 10*3/uL — ABNORMAL HIGH (ref 4.0–10.5)

## 2017-12-09 LAB — COMPREHENSIVE METABOLIC PANEL
ALT: 18 U/L (ref 14–54)
AST: 27 U/L (ref 15–41)
Albumin: 3.7 g/dL (ref 3.5–5.0)
Alkaline Phosphatase: 45 U/L (ref 38–126)
Anion gap: 13 (ref 5–15)
BUN: 27 mg/dL — ABNORMAL HIGH (ref 6–20)
CHLORIDE: 93 mmol/L — AB (ref 101–111)
CO2: 20 mmol/L — ABNORMAL LOW (ref 22–32)
CREATININE: 1.38 mg/dL — AB (ref 0.44–1.00)
Calcium: 9.8 mg/dL (ref 8.9–10.3)
GFR calc non Af Amer: 34 mL/min — ABNORMAL LOW (ref >60)
GFR, EST AFRICAN AMERICAN: 40 mL/min — AB (ref >60)
Glucose, Bld: 184 mg/dL — ABNORMAL HIGH (ref 65–99)
POTASSIUM: 3.8 mmol/L (ref 3.5–5.1)
Sodium: 126 mmol/L — ABNORMAL LOW (ref 135–145)
Total Bilirubin: 0.2 mg/dL — ABNORMAL LOW (ref 0.3–1.2)
Total Protein: 6.6 g/dL (ref 6.5–8.1)

## 2017-12-09 LAB — TROPONIN I: Troponin I: <0.03 ng/mL (ref <0.03)

## 2017-12-09 NOTE — ED Provider Notes (Signed)
[facility] EMERGENCY DEPARTMENT Provider Note   CSN: [id number] Arrival date & time: 12/09/17  2044     History   Chief Complaint Chief Complaint  Patient presents with  . Chest Pain    HPI MARQUITA LIAS is a 84 y.o. female.  HPI Patient presents with right-sided chest pain which started today while eating.  Worsened again this evening after taking her medications.  States the pain feels like acid reflux.  Denies any shortness of breath, cough, fever or chills.  Recently diagnosed with atrial fibrillation.  No palpitations.  Denies any new lower extremity swelling or pain.  No abdominal pain.  No nausea, vomiting or diarrhea. Past Medical History:  Diagnosis Date  . Adenomatous polyp    h/o  . Asthma   . COPD (chronic obstructive pulmonary disease) ([place])   . Diabetes ([place])   . Disorientation, unspecified   . GERD (gastroesophageal reflux disease)   . Hemorrhoids    intermittently,bleeding  . Hyperlipidemia   . Hypertension     Patient Active Problem List   Diagnosis Date Noted  . Hypomagnesemia 12/02/2017  . New onset atrial fibrillation ([place]) 12/01/2017  . COPD (chronic obstructive pulmonary disease) ([place]) 12/01/2017  . Adenomatous polyps 08/18/2014  . Chronic cough 06/02/2012  . Type 2 diabetes mellitus ([place]) 08/14/2009  . Essential hypertension, benign 08/14/2009  . VENTRICULAR TACHYCARDIA, PAROXYSMAL 08/14/2009  . GERD 08/14/2009  . PALPITATIONS, HX OF 08/14/2009  . Hyperlipidemia 08/11/2009  . HEMORRHOIDS 08/11/2009    Past Surgical History:  Procedure Laterality Date  . BREAST BIOPSY     right,for benign disease  . COLONOSCOPY  07/30/2002   EXH:BZJIRC rectum/Transverse left-sided diverticulum/The remainder of the colonic mucosa appeared normal  . COLONOSCOPY N/A 09/14/2014   Procedure: COLONOSCOPY;  Surgeon: Daneil Dolin, MD;  Location: [facility];  Service: Endoscopy;  Laterality: N/A;  8:30 AM - moved to 9:30 - Ginger to notify pt  .  TONSILLECTOMY      OB History    No data available       Home Medications    Prior to Admission medications   Medication Sig Start Date End Date Taking? Authorizing Provider  acetaminophen (TYLENOL) 500 MG tablet Take 1,000 mg by mouth every 6 (six) hours as needed for mild pain or headache.   Yes [provider]  budesonide-formoterol (SYMBICORT) 80-4.5 MCG/ACT inhaler Inhale 1 puff into the lungs 2 (two) times daily.   Yes [provider]  Calcium Carbonate-Vitamin D (CALTRATE 600+D) 600-400 MG-UNIT per tablet Take 1 tablet by mouth 2 (two) times daily.   Yes [provider]  diltiazem (CARDIZEM CD) 120 MG 24 hr capsule Take 1 capsule (120 mg total) by mouth daily. 12/04/17  Yes Tat, Shanon Brow, MD  ergocalciferol (VITAMIN D2) 50000 UNITS capsule Take 50,000 Units by mouth every 14 (fourteen) days.    Yes [provider]  glipiZIDE (GLUCOTROL) 5 MG tablet Take 1 tablet (5 mg total) by mouth daily before breakfast. 12/04/17  Yes Tat, Shanon Brow, MD  guaiFENesin-dextromethorphan (ROBITUSSIN DM) 100-10 MG/5ML syrup Take 5 mLs by mouth 2 (two) times daily as needed for cough.   Yes [provider]  insulin glargine (LANTUS) 100 UNIT/ML injection Inject 12 Units into the skin daily.   Yes [provider]  metFORMIN (GLUCOPHAGE) 500 MG tablet Take 1,000 mg by mouth 2 (two) times daily with a meal.    Yes [provider]  metoprolol tartrate (LOPRESSOR) 25 MG tablet Take  1 tablet (25 mg total) by mouth 2 (two) times daily. 12/04/17  Yes Tat, Shanon Brow, MD  Omega-3 1000 MG CAPS Take 1 capsule by mouth daily.    Yes [provider]  pantoprazole (PROTONIX) 40 MG tablet Take 40 mg by mouth daily.  07/06/13  Yes [provider]  ranitidine (ZANTAC) 300 MG tablet Take 300 mg by mouth at bedtime.   Yes [provider]  Rivaroxaban (XARELTO) 15 MG TABS tablet Take 1 tablet (15 mg total) by mouth daily with supper. 12/04/17  Yes Tat,  Shanon Brow, MD  rosuvastatin (CRESTOR) 5 MG tablet Take 2.5 mg by mouth daily.     Yes [provider]  valsartan-hydrochlorothiazide (DIOVAN-HCT) 320-12.5 MG tablet Take 1 tablet by mouth daily.   Yes [provider]  VENTOLIN HFA 108 (90 Base) MCG/ACT inhaler Inhale 1 puff into the lungs 3 (three) times daily. 11/27/17  Yes [provider]    Family History Family History  Problem Relation Age of Onset  . COPD Father   . Heart failure Mother   . Cirrhosis Brother        alcohol related cirrhosis  . Lung cancer Brother   . Colon cancer Neg Hx     Social History Social History   Tobacco Use  . Smoking status: Never Smoker  . Smokeless tobacco: Never Used  Substance Use Topics  . Alcohol use: No  . Drug use: No     Allergies   Aspirin; Fosamax [alendronate sodium]; Levaquin [levofloxacin]; Lipitor [atorvastatin]; Lisinopril; Symbicort [budesonide-formoterol fumarate]; and Reglan [metoclopramide]   Review of Systems Review of Systems  Constitutional: Negative for chills and fever.  HENT: Negative for sore throat and trouble swallowing.   Respiratory: Negative for cough and shortness of breath.   Cardiovascular: Positive for chest pain. Negative for palpitations and leg swelling.  Gastrointestinal: Negative for abdominal pain, constipation, diarrhea, nausea and vomiting.  Genitourinary: Negative for dysuria, flank pain and frequency.  Musculoskeletal: Negative for back pain, myalgias and neck pain.  Skin: Negative for rash and wound.  Neurological: Negative for dizziness, weakness, light-headedness, numbness and headaches.  All other systems reviewed and are negative.    Physical Exam Updated Vital Signs BP (!) 115/53   Pulse 69   Temp 98.9 F (37.2 C) (Rectal)   Resp 20   Ht 5\' 6"  (1.676 m)   Wt 72.6 kg (160 lb)   SpO2 98%   BMI 25.82 kg/m   Physical Exam  Constitutional: She is oriented to person, place, and time. She appears  well-developed and well-nourished.  Non-toxic appearance. She does not appear ill. No distress.  HENT:  Head: Normocephalic and atraumatic.  Mouth/Throat: Oropharynx is clear and moist.  Eyes: EOM are normal. Pupils are equal, round, and reactive to light.  Neck: Normal range of motion. Neck supple.  Cardiovascular:  Irregularly irregular  Pulmonary/Chest: Effort normal and breath sounds normal. No stridor. No respiratory distress. She has no wheezes. She has no rales. She exhibits no tenderness.  Abdominal: Soft. Bowel sounds are normal. There is no tenderness. There is no rebound and no guarding.  Musculoskeletal: Normal range of motion. She exhibits no edema or tenderness.  No lower extremity swelling, asymmetry or tenderness.  Neurological: She is alert and oriented to person, place, and time.  Moves all extremities without focal deficit.  Sensation fully intact.  Skin: Skin is warm and dry. Capillary refill takes less than 2 seconds. No rash noted. She is not diaphoretic. No  erythema.  Psychiatric: She has a normal mood and affect. Her behavior is normal.  Nursing note and vitals reviewed.    ED Treatments / Results  Labs (all labs ordered are listed, but only abnormal results are displayed) Labs Reviewed  CBC WITH DIFFERENTIAL/PLATELET - Abnormal; Notable for the following components:      Result Value   WBC 12.3 (*)    Hemoglobin 11.8 (*)    Neutro Abs 8.2 (*)    All other components within normal limits  COMPREHENSIVE METABOLIC PANEL - Abnormal; Notable for the following components:   Sodium 126 (*)    Chloride 93 (*)    CO2 20 (*)    Glucose, Bld 184 (*)    BUN 27 (*)    Creatinine, Ser 1.38 (*)    Total Bilirubin 0.2 (*)    GFR calc non Af Amer 34 (*)    GFR calc Af Amer 40 (*)    All other components within normal limits  TROPONIN I  TROPONIN I    EKG  EKG Interpretation  Date/Time:  Tuesday December 09 2017 20:49:58 EST Ventricular Rate:  81 PR Interval:     QRS Duration: 135 QT Interval:  418 QTC Calculation: 486 R Axis:   82 Text Interpretation:  Atrial fibrillation Right bundle branch block Confirmed by Julianne Rice [phone]) on 12/09/2017 8:54:35 PM       Radiology Dg Chest 2 View  Result Date: 12/09/2017 CLINICAL DATA:  Chest pain EXAM: CHEST  2 VIEW COMPARISON:  11/21/2017 FINDINGS: Normal heart size and mediastinal contours. Atherosclerotic calcification. There is no edema, consolidation, effusion, or pneumothorax. Mild lung scarring or atelectasis. Subtle density over the anterior right third rib that is not convincing for nodule. No acute osseous finding. Lung volumes are large. IMPRESSION: No evidence of active disease. Electronically Signed   By: Monte Fantasia M.D.   On: 12/09/2017 23:01    Procedures Procedures (including critical care time)  Medications Ordered in ED Medications - No data to display   Initial Impression / Assessment and Plan / ED Course  I have reviewed the triage vital signs and the nursing notes.  Pertinent labs & imaging results that were available during my care of the patient were reviewed by me and considered in my medical decision making (see chart for details).     Patient presents with atypical chest pain in the right chest exacerbated after eating lunch and then swelling medicines tonight.  Suspect may be gastrointestinal related.  Initial troponin/EKG is normal.  Repeat troponin pending.  Signed out to oncoming emergency physician.  Anticipate likely discharge back to nursing facility.  Final Clinical Impressions(s) / ED Diagnoses   Final diagnoses:  Atypical chest pain    ED Discharge Orders    None       Julianne Rice, MD 12/09/17 2335

## 2017-12-09 NOTE — ED Triage Notes (Signed)
Pt began having CP, epigastric region, approx 45 minutes ago with neck pain. Per SNF staff pt has had pain all day. Pt is belching, denies improvement. SNF gave pt ASA which pt is allergic to, denies SOB, hives, itching, or throat swelling.

## 2017-12-10 ENCOUNTER — Encounter: Payer: Self-pay | Admitting: Neurology

## 2017-12-10 ENCOUNTER — Ambulatory Visit: Payer: Medicare Other | Admitting: Neurology

## 2017-12-10 VITALS — BP 133/67 | HR 62 | Ht 65.0 in | Wt 158.6 lb

## 2017-12-10 DIAGNOSIS — F410 Panic disorder [episodic paroxysmal anxiety] without agoraphobia: Secondary | ICD-10-CM | POA: Diagnosis not present

## 2017-12-10 DIAGNOSIS — E1159 Type 2 diabetes mellitus with other circulatory complications: Secondary | ICD-10-CM | POA: Diagnosis not present

## 2017-12-10 DIAGNOSIS — I10 Essential (primary) hypertension: Secondary | ICD-10-CM | POA: Diagnosis not present

## 2017-12-10 DIAGNOSIS — I63412 Cerebral infarction due to embolism of left middle cerebral artery: Secondary | ICD-10-CM

## 2017-12-10 DIAGNOSIS — F419 Anxiety disorder, unspecified: Secondary | ICD-10-CM

## 2017-12-10 DIAGNOSIS — Z794 Long term (current) use of insulin: Secondary | ICD-10-CM

## 2017-12-10 DIAGNOSIS — I480 Paroxysmal atrial fibrillation: Secondary | ICD-10-CM

## 2017-12-10 LAB — TROPONIN I: Troponin I: <0.03 ng/mL (ref <0.03)

## 2017-12-10 MED ORDER — DONEPEZIL HCL 10 MG PO TABS: 10.0000 mg | ORAL_TABLET | Freq: Every day | ORAL | 3 refills | Status: AC

## 2017-12-10 MED ORDER — DONEPEZIL HCL 5 MG PO TABS: 5.0000 mg | ORAL_TABLET | Freq: Every day | ORAL | 0 refills | Status: DC

## 2017-12-10 NOTE — ED Provider Notes (Signed)
Patient signed out pending repeat troponin.  Repeat troponin is negative.  On recheck, patient's vital signs are stable.  She states that she may need a breathing treatment.  She is in no acute distress with a pulse ox of 96%.  I discussed with her other workup and follow-up as recommended by Dr. Lita Mains.  He felt that her pain was atypical.  She is currently pain-free.   Merryl Hacker, MD 12/10/17 [phone]

## 2017-12-10 NOTE — Patient Instructions (Addendum)
-   continue Xarelto and crestor for stroke prevention - Follow up with your primary care physician for stroke risk factor modification. Recommend maintain blood pressure goal <130/80, diabetes with hemoglobin A1c goal below 7.0% and lipids with LDL cholesterol goal below 70 mg/dL.  - will set appointment with your cardiologist to follow up on afib - will repeat MRI in 2-3 weeks.  - will prescribe aricept for anxiety and panic attack  - check BP and glucose at home - diabetic diet and self exercise - follow up in 2 months.

## 2017-12-11 DIAGNOSIS — F419 Anxiety disorder, unspecified: Secondary | ICD-10-CM | POA: Insufficient documentation

## 2017-12-11 DIAGNOSIS — I639 Cerebral infarction, unspecified: Secondary | ICD-10-CM | POA: Insufficient documentation

## 2017-12-11 DIAGNOSIS — F410 Panic disorder [episodic paroxysmal anxiety] without agoraphobia: Secondary | ICD-10-CM | POA: Insufficient documentation

## 2017-12-11 NOTE — Progress Notes (Signed)
NEUROLOGY CLINIC NEW PATIENT NOTE  NAME: Sarah Duarte DOB: May 17, 1932 REFERRING PHYSICIAN: Glenda Chroman, MD  I saw Sarah Duarte as a new consult in the neurovascular clinic today regarding  Chief Complaint  Patient presents with  . New Patient (Initial Visit)    Referral from Dr. Woody Seller pt was in [facility] in 11/2017 for confusion and atral fibrillation In November 2018 pt had double pneumonia. Pt is in [facility] living  .  HPI: Sarah Duarte is a 84 y.o. female with PMH of COPD, diabetes, hypertension, recent diagnosis of A. fib on Xarelto who presents as a new patient for a stroke.   History obtained mainly from the son.  He stated that patient was admitted in [facility] in November for pneumonia which was treated with prednisone.  During hospitalization, patient started to have confusion but no other focal neurological deficit.  At that time, it was considered as encephalopathy due to double pneumonia.  She was later discharged to rehab for 16 days.  She went home on 11/16/17, but son still noticed some confusion.  For example, not know how to check sugar level at home, not able to use remote control, short memory loss, intermittent word finding difficulties and gramma errors. Episode of trouble walking, anxious and panic attack. It was more pronounced and night with fatigue and less pronounced during the day.  had MRI 11/21/17 with and without contrast showed possible left inferior MCA subacute infarct with mild enhancement, recommend repeat MRI in 4-6 weeks.  MRA head and neck 11/24/17 showed unremarkable except 3 mm paraophthalmic aneurysm.  On 12/01/17, patient had abdominal pain, nausea, and was admitted to any pain, found to have atrial fibrillation with RVR.  Rate controlled by metoprolol and Cardizem.  Started anticoagulation with heparin IV first and then transition to Xarelto.  Also treated anxiety with Xanax.  Discharged on 12/04/17, however  developed anxiety, panic attack, atypical chest pain on 12/09/17, sent to ER, rule out ACS before discharge home.  Patient has been following with cardiologist Dr. Bronson Ing in the past for paroxysmal ventricular tachycardia, but has no further appointment at this time.  Today, Patient came in with son, no acute distress, admitted that she sometimes feels anxious and panic, does not know why that happened.  However paucity of speech with cognitive impairment on exam.  Patient son stated that he did not notice any significant cognitive impairment before pneumonia in 10/2017, but she does have some short memory difficulty but long-term memory is good.  She was living independently before this happened.  History of hypertension, on BP meds with losartan and HCTZ.  Glucose still fluctuating at home, on Lantus and glipizide as well as metformin.  COPD on inhalers and Symbicort.  Currently living at assisted living facility.   Past Medical History:  Diagnosis Date  . Adenomatous polyp    h/o  . Asthma   . COPD (chronic obstructive pulmonary disease) ([place])   . Diabetes ([place])   . Disorientation, unspecified   . GERD (gastroesophageal reflux disease)   . Hemorrhoids    intermittently,bleeding  . Hyperlipidemia   . Hypertension    Past Surgical History:  Procedure Laterality Date  . BREAST BIOPSY     right,for benign disease  . COLONOSCOPY  07/30/2002   [id number] rectum/Transverse left-sided diverticulum/The remainder of the colonic mucosa appeared normal  . COLONOSCOPY N/A 09/14/2014   Procedure: COLONOSCOPY;  Surgeon: Daneil Dolin, MD;  Location: [facility];  Service: Endoscopy;  Laterality: N/A;  8:30 AM - moved to 9:30 - Ginger to notify pt  . TONSILLECTOMY     Family History  Problem Relation Age of Onset  . COPD Father   . Heart failure Mother   . Cirrhosis Brother        alcohol related cirrhosis  . Lung cancer Brother   . Colon cancer Neg Hx    Current Outpatient  Medications  Medication Sig Dispense Refill  . acetaminophen (TYLENOL) 500 MG tablet Take 1,000 mg by mouth every 6 (six) hours as needed for mild pain or headache.    . budesonide-formoterol (SYMBICORT) 80-4.5 MCG/ACT inhaler Inhale 1 puff into the lungs 2 (two) times daily.    . Calcium Carbonate-Vitamin D (CALTRATE 600+D) 600-400 MG-UNIT per tablet Take 1 tablet by mouth 2 (two) times daily.    Marland Kitchen diltiazem (CARDIZEM CD) 120 MG 24 hr capsule Take 1 capsule (120 mg total) by mouth daily. 30 capsule 1  . ergocalciferol (VITAMIN D2) 50000 UNITS capsule Take 50,000 Units by mouth every 14 (fourteen) days.     Marland Kitchen glipiZIDE (GLUCOTROL) 5 MG tablet Take 1 tablet (5 mg total) by mouth daily before breakfast. 30 tablet 1  . guaiFENesin-dextromethorphan (ROBITUSSIN DM) 100-10 MG/5ML syrup Take 5 mLs by mouth 2 (two) times daily as needed for cough.    . insulin glargine (LANTUS) 100 UNIT/ML injection Inject 12 Units into the skin daily.    . metFORMIN (GLUCOPHAGE) 500 MG tablet Take 1,000 mg by mouth 2 (two) times daily with a meal.     . metoprolol tartrate (LOPRESSOR) 25 MG tablet Take 1 tablet (25 mg total) by mouth 2 (two) times daily. 60 tablet 1  . Omega-3 1000 MG CAPS Take 1 capsule by mouth daily.     . pantoprazole (PROTONIX) 40 MG tablet Take 40 mg by mouth daily.     . ranitidine (ZANTAC) 300 MG tablet Take 300 mg by mouth at bedtime.    . Rivaroxaban (XARELTO) 15 MG TABS tablet Take 1 tablet (15 mg total) by mouth daily with supper. 30 tablet 0  . rosuvastatin (CRESTOR) 5 MG tablet Take 2.5 mg by mouth daily.      . valsartan-hydrochlorothiazide (DIOVAN-HCT) 320-12.5 MG tablet Take 1 tablet by mouth daily.    . VENTOLIN HFA 108 (90 Base) MCG/ACT inhaler Inhale 1 puff into the lungs 3 (three) times daily.  10  . [START ON 01/10/2018] donepezil (ARICEPT) 10 MG tablet Take 1 tablet (10 mg total) by mouth at bedtime. 30 tablet 3  . donepezil (ARICEPT) 5 MG tablet Take 1 tablet (5 mg total) by mouth  daily with breakfast. 30 tablet 0   No current facility-administered medications for this visit.    Allergies  Allergen Reactions  . Aspirin     Due to asthma   . Fosamax [Alendronate Sodium]   . Levaquin [Levofloxacin]   . Lipitor [Atorvastatin]   . Lisinopril   . Symbicort [Budesonide-Formoterol Fumarate]   . Reglan [Metoclopramide] Other (See Comments)    Panic attacks   Social History   Socioeconomic History  . Marital status: Widowed    Spouse name: Not on file  . Number of children: Not on file  . Years of education: Not on file  . Highest education level: Not on file  Social Needs  . Financial resource strain: Not on file  . Food insecurity - worry: Not on file  . Food insecurity - inability:  Not on file  . Transportation needs - medical: Not on file  . Transportation needs - non-medical: Not on file  Occupational History  . Not on file  Tobacco Use  . Smoking status: Never Smoker  . Smokeless tobacco: Never Used  Substance and Sexual Activity  . Alcohol use: No  . Drug use: No  . Sexual activity: Not on file  Other Topics Concern  . Not on file  Social History Narrative   Has 2 children and has been married for 50 years.    Retired from [employer].    Review of Systems Full 14 system review of systems performed and notable only for those listed, all others are neg:  Constitutional: Fatigue Cardiovascular:  Ear/Nose/Throat:   Skin:  Eyes:   Respiratory: SOB, cough Gastroitestinal:   Genitourinary:  Hematology/Lymphatic: Easy bleeding Endocrine: Feeling cold Musculoskeletal:   Allergy/Immunology: Allergies Neurological: Memory loss, confusion, weakness Psychiatric: Depression, anxiety, disinterest in activities Sleep:    Physical Exam  Vitals:   12/10/17 1403  BP: 133/67  Pulse: 62    General - Well nourished, well developed, in no apparent distress.  Ophthalmologic - fundi not visualized due to noncooperation.  Cardiovascular -  irregularly irregular heart rate and rhythm.   Mental Status -  Level of arousal and orientation to place, and person were intact, however not orientated to time. Language exam showed paucity of speech, able to follow simple commands, naming 3/4, difficulty with repeating due to registration deficit. Attention span and concentration were impaired, not able to backward spelling world and not able to calculate. Recent and remote memory were impaired, 1/3 registration, 1/3 delayed recall. Fund of Knowledge was assessed and was impaired.  Cranial Nerves II - XII - II - Visual field intact OU. III, IV, VI - Extraocular movements intact. V - Facial sensation intact bilaterally. VII - Facial movement intact bilaterally. VIII - Hearing & vestibular intact bilaterally. X - Palate elevates symmetrically. XI - Chin turning & shoulder shrug intact bilaterally. XII - Tongue protrusion intact.  Motor Strength - The patient's strength was normal in all extremities and pronator drift was absent.  Bulk was normal and fasciculations were absent.   Motor Tone - Muscle tone was assessed at the neck and appendages and was normal.  Reflexes - The patient's reflexes were normal in all extremities and she had no pathological reflexes.  Sensory - Light touch, temperature/pinprick were assessed and were normal.    Coordination - The patient had normal movements in the hands with no ataxia or dysmetria.  Tremor was absent.  Gait and Station - walk without assistance, slow and small stripe.   Imaging  I have personally reviewed the radiological images below and agree with the radiology interpretations.  CT head 10/27/17 1.  No acute intracranial abnormality identified 2.  Stable mild chronic microvascular ischemic changes and mild parenchymal volume loss of the brain  MRI head without contrast 11/21/17 Background pattern of moderate chronic small vessel ischemic changes Region of low level restricted  diffusion in the white matter at the left temporoparietal junction with sparing of overlying cortex.  This does not look likely ischemic infarction.  This could represent atypical infectious cerebritis or autoimmune cerebritis.  Recommend contrast-enhanced imaging to see if this enhances.  MRI head with contrast 11/21/17 Subacute left MCA territory infarction is favored.  Mild postcontrast cortical/intraventricular enhancement.  Recommend repeat MRI brain in 4-6 weeks to ensure resolution/normalization, given that this constellation of imaging finding is atypical for  infarction.  MRA head 11/24/17 1.  Moderate motion degraded examination 2.  No flow limiting stenosis or large vessel occlusion. 3.  7 mm suspected left paraophthalmic aneurysm.  Recommend follow-up non-urgent CTA head for confirmation and characterization. 4.  Moderate PCA atherosclerosis and/or motion artifact  MRA neck 11/24/17 1.  Mildly motion degraded examination. 2.  No hemodynamically significant stenosis 3.  Mild stenosis left V1 segment versus flow artifact  Lab Review none   Assessment and Plan:   In summary, Sarah Duarte is a 84 y.o. female with PMH of COPD, diabetes, hypertension, recent diagnosis of A. fib on Xarelto who presents for a stroke follow-up.  She started to have confusion during hospitalization in 10/2017, subsequent MRI showed possible left MCA infarct.  MRA head and neck unremarkable.  She was found to have A. fib RVR in 11/2017, put on Xarelto.  Still has confusion, short term memory loss, cognitive impairment, anxiety and intermittent panic attack.  Was treated with Xanax, but has been discontinued.  Examination consistent with cognitive impairment, vascular versus Alzheimer dementia.  We will continue Xarelto and Crestor for stroke prevention, start Aricept for dementia and anxiety with panic attack.  Will also repeat MRI as recommended by radiologist.  - continue Xarelto and crestor for stroke  prevention - Follow up with your primary care physician for stroke risk factor modification. Recommend maintain blood pressure goal <130/80, diabetes with hemoglobin A1c goal below 7.0% and lipids with LDL cholesterol goal below 70 mg/dL.  - Continue follow-up with cardiologist Dr. Bronson Ing for afib - will repeat MRI in 2-3 weeks.  - will prescribe aricept for anxiety and panic attack.  5 mg daily for 1 month and then 10 mg daily. - check BP and glucose at home - diabetic diet and self exercise - follow up in 2 months.  I recommend aggressive blood pressure control with a goal <130/80 mm Hg.  Lipids should be managed intensively, with a goal LDL < 70 mg/dL.  I encouraged the patient to discuss these important issues with her primary care physician.  I counseled the patient on measures to reduce stroke risk, including the importance of medication compliance, risk factor control, exercise, healthy diet, and avoidance of smoking.  I reviewed stroke warning signs and symptoms and appropriate actions to take if such occurs.   Thank you very much for the opportunity to participate in the care of this patient.  Please do not hesitate to call if any questions or concerns arise.  Orders Placed This Encounter  Procedures  . MR BRAIN WO CONTRAST    Standing Status:   Future    Standing Expiration Date:   02/08/2019    Order Specific Question:   What is the patient's sedation requirement?    Answer:   No Sedation    Order Specific Question:   Does the patient have a pacemaker or implanted devices?    Answer:   No    Order Specific Question:   Preferred imaging location?    Answer:   Internal    Order Specific Question:   Radiology Contrast Protocol - do NOT remove file path    Answer:   [email or web address]  . Ambulatory referral to Cardiology    Referral Priority:   Routine    Referral Type:   Consultation    Referral Reason:   Specialty Services Required    Referred  to Provider:   Herminio Commons, MD    Requested Specialty:   Cardiology  Number of Visits Requested:   1    Meds ordered this encounter  Medications  . donepezil (ARICEPT) 5 MG tablet    Sig: Take 1 tablet (5 mg total) by mouth daily with breakfast.    Dispense:  30 tablet    Refill:  0  . donepezil (ARICEPT) 10 MG tablet    Sig: Take 1 tablet (10 mg total) by mouth at bedtime.    Dispense:  30 tablet    Refill:  3    Start One months after the low aricept dose. thanks    Patient Instructions  - continue Xarelto and crestor for stroke prevention - Follow up with your primary care physician for stroke risk factor modification. Recommend maintain blood pressure goal <130/80, diabetes with hemoglobin A1c goal below 7.0% and lipids with LDL cholesterol goal below 70 mg/dL.  - will set appointment with your cardiologist to follow up on afib - will repeat MRI in 2-3 weeks.  - will prescribe aricept for anxiety and panic attack  - check BP and glucose at home - diabetic diet and self exercise - follow up in 2 months.   Rosalin Hawking, MD PhD [facility] [address], [place], [place] [address] [phone]

## 2017-12-18 ENCOUNTER — Telehealth: Payer: Self-pay | Admitting: Neurology

## 2017-12-18 NOTE — Telephone Encounter (Signed)
I left a voicemail for patient son to call me back about scheduling his mom's mri at [facility].

## 2017-12-18 NOTE — Telephone Encounter (Signed)
Pt son(Steve) on DPR is asking for a call from [facility] re: MRI, please call @ [phone])

## 2017-12-23 ENCOUNTER — Other Ambulatory Visit: Payer: Self-pay

## 2017-12-23 MED ORDER — ALPRAZOLAM 0.5 MG PO TABS: 0.5000 mg | ORAL_TABLET | Freq: Two times a day (BID) | ORAL | 0 refills | Status: DC | PRN

## 2017-12-23 NOTE — Telephone Encounter (Signed)
Xanax order fax to [facility] at [phone]. Fax twice and receive.

## 2017-12-23 NOTE — Telephone Encounter (Signed)
Patient is schedule to have her MRI done on Friday 12/26/17 at [facility] arrival time is 2:45 pm. Her son Richardson Landry is aware of time and day. He did inform me she is claustrophobic and would need something to calm her nerves.

## 2017-12-23 NOTE — Telephone Encounter (Signed)
Rn call Richardson Landry about pt needing something for her MRI. He stated pt will need something to calm her nerves. Rn ask is patient has taken xanax before. Pt's son stated his mother has taken it in the past, and had no reactions. Rn stated pt will be prescribed two pills,and will not be able to drive. Richardson Landry stated he will be driving pt to the MRI testing site, and picking her up. Rn stated it can be fax to [facility].

## 2018-01-19 ENCOUNTER — Other Ambulatory Visit: Payer: Self-pay

## 2018-01-19 ENCOUNTER — Ambulatory Visit (INDEPENDENT_AMBULATORY_CARE_PROVIDER_SITE_OTHER): Payer: Medicare Other | Admitting: Cardiovascular Disease

## 2018-01-19 ENCOUNTER — Encounter: Payer: Self-pay | Admitting: Cardiovascular Disease

## 2018-01-19 VITALS — BP 142/68 | HR 81 | Ht 65.0 in | Wt 153.0 lb

## 2018-01-19 DIAGNOSIS — I10 Essential (primary) hypertension: Secondary | ICD-10-CM

## 2018-01-19 DIAGNOSIS — I481 Persistent atrial fibrillation: Secondary | ICD-10-CM | POA: Diagnosis not present

## 2018-01-19 DIAGNOSIS — I4729 Other ventricular tachycardia: Secondary | ICD-10-CM

## 2018-01-19 DIAGNOSIS — I472 Ventricular tachycardia, unspecified: Secondary | ICD-10-CM

## 2018-01-19 DIAGNOSIS — Z9289 Personal history of other medical treatment: Secondary | ICD-10-CM

## 2018-01-19 DIAGNOSIS — I4819 Other persistent atrial fibrillation: Secondary | ICD-10-CM

## 2018-01-19 DIAGNOSIS — J449 Chronic obstructive pulmonary disease, unspecified: Secondary | ICD-10-CM | POA: Diagnosis not present

## 2018-01-19 DIAGNOSIS — R0609 Other forms of dyspnea: Secondary | ICD-10-CM | POA: Diagnosis not present

## 2018-01-19 MED ORDER — DILTIAZEM HCL ER COATED BEADS 180 MG PO CP24: 180.0000 mg | ORAL_CAPSULE | Freq: Every day | ORAL | 6 refills | Status: AC

## 2018-01-19 NOTE — Progress Notes (Signed)
SUBJECTIVE: The patient presents for routine follow-up.  She was hospitalized for rapid atrial fibrillation in early January 2019.  Metoprolol was increased to 25 mg twice daily.  She is systemically anticoagulated with Xarelto.  Long-acting diltiazem was also added 120 mg daily.  Past medical history also includes paroxysmal ventricular tachycardia and hypertension.  Echocardiogram 12/03/17 showed normal left ventricular systolic function and regional wall motion, LVEF 60-65%.  There was mild left atrial dilatation.  She has episodic palpitations but continues to complain of exertional fatigue.  She underwent a normal stress test last year.  She has spells of coughing and shortness of breath.  They can occur both with and without exertion.    Review of Systems: As per "subjective", otherwise negative.  Allergies  Allergen Reactions  . Aspirin     Due to asthma   . Fosamax [Alendronate Sodium]   . Levaquin [Levofloxacin]   . Lipitor [Atorvastatin]   . Lisinopril   . Symbicort [Budesonide-Formoterol Fumarate]   . Reglan [Metoclopramide] Other (See Comments)    Panic attacks    Current Outpatient Medications  Medication Sig Dispense Refill  . acetaminophen (TYLENOL) 500 MG tablet Take 1,000 mg by mouth every 6 (six) hours as needed for mild pain or headache.    . ALPRAZolam (XANAX) 0.5 MG tablet Take 1 tablet (0.5 mg total) by mouth 2 (two) times daily as needed for anxiety. 2 tablet 0  . budesonide-formoterol (SYMBICORT) 80-4.5 MCG/ACT inhaler Inhale 1 puff into the lungs 2 (two) times daily.    . Calcium Carbonate-Vitamin D (CALTRATE 600+D) 600-400 MG-UNIT per tablet Take 1 tablet by mouth 2 (two) times daily.    Marland Kitchen diltiazem (CARDIZEM CD) 120 MG 24 hr capsule Take 1 capsule (120 mg total) by mouth daily. 30 capsule 1  . donepezil (ARICEPT) 10 MG tablet Take 1 tablet (10 mg total) by mouth at bedtime. 30 tablet 3  . donepezil (ARICEPT) 5 MG tablet Take 1 tablet (5 mg total)  by mouth daily with breakfast. 30 tablet 0  . ergocalciferol (VITAMIN D2) 50000 UNITS capsule Take 50,000 Units by mouth every 14 (fourteen) days.     Marland Kitchen glipiZIDE (GLUCOTROL) 5 MG tablet Take 1 tablet (5 mg total) by mouth daily before breakfast. 30 tablet 1  . guaiFENesin-dextromethorphan (ROBITUSSIN DM) 100-10 MG/5ML syrup Take 5 mLs by mouth 2 (two) times daily as needed for cough.    . insulin glargine (LANTUS) 100 UNIT/ML injection Inject 12 Units into the skin daily.    . metFORMIN (GLUCOPHAGE) 500 MG tablet Take 1,000 mg by mouth 2 (two) times daily with a meal.     . metoprolol tartrate (LOPRESSOR) 25 MG tablet Take 1 tablet (25 mg total) by mouth 2 (two) times daily. 60 tablet 1  . Omega-3 1000 MG CAPS Take 1 capsule by mouth daily.     . pantoprazole (PROTONIX) 40 MG tablet Take 40 mg by mouth daily.     . ranitidine (ZANTAC) 300 MG tablet Take 300 mg by mouth at bedtime.    . Rivaroxaban (XARELTO) 15 MG TABS tablet Take 1 tablet (15 mg total) by mouth daily with supper. 30 tablet 0  . rosuvastatin (CRESTOR) 5 MG tablet Take 2.5 mg by mouth daily.      . valsartan-hydrochlorothiazide (DIOVAN-HCT) 320-12.5 MG tablet Take 1 tablet by mouth daily.    . VENTOLIN HFA 108 (90 Base) MCG/ACT inhaler Inhale 1 puff into the lungs 3 (three) times daily.  10   No current facility-administered medications for this visit.     Past Medical History:  Diagnosis Date  . Adenomatous polyp    h/o  . Asthma   . COPD (chronic obstructive pulmonary disease) ([place])   . Diabetes ([place])   . Disorientation, unspecified   . GERD (gastroesophageal reflux disease)   . Hemorrhoids    intermittently,bleeding  . Hyperlipidemia   . Hypertension     Past Surgical History:  Procedure Laterality Date  . BREAST BIOPSY     right,for benign disease  . COLONOSCOPY  07/30/2002   [id number]:[id number] rectum/Transverse left-sided diverticulum/The remainder of the colonic mucosa appeared normal  . COLONOSCOPY N/A 09/14/2014    Procedure: COLONOSCOPY;  Surgeon: Daneil Dolin, MD;  Location: [facility];  Service: Endoscopy;  Laterality: N/A;  8:30 AM - moved to 9:30 - Ginger to notify pt  . TONSILLECTOMY      Social History   Socioeconomic History  . Marital status: Widowed    Spouse name: Not on file  . Number of children: Not on file  . Years of education: Not on file  . Highest education level: Not on file  Social Needs  . Financial resource strain: Not on file  . Food insecurity - worry: Not on file  . Food insecurity - inability: Not on file  . Transportation needs - medical: Not on file  . Transportation needs - non-medical: Not on file  Occupational History  . Not on file  Tobacco Use  . Smoking status: Never Smoker  . Smokeless tobacco: Never Used  Substance and Sexual Activity  . Alcohol use: No  . Drug use: No  . Sexual activity: Not on file  Other Topics Concern  . Not on file  Social History Narrative   Has 2 children and has been married for 50 years.    Retired from [employer].     Vitals:   01/19/18 1307  BP: (!) 142/68  Pulse: 81  SpO2: 98%  Weight: 153 lb (69.4 kg)  Height: 5\' 5"  (1.651 m)    Wt Readings from Last 3 Encounters:  01/19/18 153 lb (69.4 kg)  12/10/17 158 lb 9.6 oz (71.9 kg)  12/09/17 160 lb (72.6 kg)     PHYSICAL EXAM General: NAD HEENT: Normal. Neck: No JVD, no thyromegaly. Lungs: Clear to auscultation bilaterally with normal respiratory effort. CV: Regular rate and irregular rhythm, normal S1/S2, no S3, no murmur. No pretibial or periankle edema. Abdomen: Soft, nontender, no distention.  Neurologic: Alert and oriented.  Psych: Normal affect. Skin: Normal. Musculoskeletal: No gross deformities.    ECG: Most recent ECG reviewed.   Labs: Lab Results  Component Value Date/Time   K 3.8 12/09/2017 09:25 PM   BUN 27 (H) 12/09/2017 09:25 PM   CREATININE 1.38 (H) 12/09/2017 09:25 PM   ALT 18 12/09/2017 09:25 PM   TSH 1.218 12/02/2017 07:57  AM   HGB 11.8 (L) 12/09/2017 09:25 PM     Lipids: No results found for: LDLCALC, LDLDIRECT, CHOL, TRIG, HDL     ASSESSMENT AND PLAN: 1.  Persistent atrial fibrillation: She has episodic shortness of breath both with and without exertion.  She does have COPD as well.  She is currently on long-acting diltiazem 120 daily.  I will increase long-acting diltiazem to 180 mg daily for more optimal heart rate control to see if this helps to alleviate her symptoms.  She is on systemic anticoagulation with low-dose Xarelto 15 mg daily.  2.  Paroxysmal ventricular tachycardia: No recurrences.  Normal coronary arteries in 2008.  3.  Chronic hypertension: Blood pressure is mildly elevated.  I will monitor.  4.  Shortness of breath and exertional fatigue with COPD: She underwent a normal nuclear stress test on 06/10/17.  I will make a referral to a pulmonologist.   Disposition: Follow up 6 months  Kate Sable, M.D., F.A.C.C.

## 2018-01-19 NOTE — Patient Instructions (Signed)
Medication Instructions:   Increase Diltiazem CD to 180mg  daily.  Continue all other medications.    Labwork: none  Testing/Procedures: none  Follow-Up: Your physician wants you to follow up in: 6 months.  You will receive a reminder letter in the mail one-two months in advance.  If you don't receive a letter, please call our office to schedule the follow up appointment   Any Other Special Instructions Will Be Listed Below (If Applicable). You have been referred to:  [facility]monology   If you need a refill on your cardiac medications before your next appointment, please call your pharmacy.

## 2018-01-20 ENCOUNTER — Encounter: Payer: Self-pay | Admitting: Neurology

## 2018-01-20 NOTE — Progress Notes (Signed)
Reviewed pt MRI result on 12/26/17 which showed progression of hyperintensity in the left parietal white matter.  Hyperintensity on diffusion is unchanged, may be T2 shine through or pseudonormalization.  Differential includes late subacute infarct, demyelinating disease and cerebritis which are other considerations.  Continued MRI follow-up is suggested, preferably with IV contrast.  Atrophy and chronic microvascular ischemia.  No other areas restricted diffusion.  I reviewed again her MRI in 11/2017, the T2 hyperintensity lesion at the left parietal white matter seems more pronounced in the current MRI, however, DWI signal was similar between these 2 exams.  This pattern is really atypical for subacute stroke or acute stroke.  However, concerning for vasculitis or cerebritis.  Patient does have relatively acute onset confusion, altered mental status and onset and at last follow-up.  She has appointment next month with me, we need to repeat MRI with and without contrast, and may have to consider LP or steroids empiric treatment if symptoms continue.  Her Xarelto use may complicate LP procedures.  Rosalin Hawking, MD PhD Stroke Neurology 01/20/2018 5:30 PM

## 2018-02-11 ENCOUNTER — Encounter: Payer: Self-pay | Admitting: Pulmonary Disease

## 2018-02-11 ENCOUNTER — Ambulatory Visit: Payer: Medicare Other | Admitting: Pulmonary Disease

## 2018-02-11 VITALS — BP 134/64 | HR 59 | Ht 65.0 in | Wt 148.0 lb

## 2018-02-11 DIAGNOSIS — J301 Allergic rhinitis due to pollen: Secondary | ICD-10-CM | POA: Diagnosis not present

## 2018-02-11 DIAGNOSIS — J45909 Unspecified asthma, uncomplicated: Secondary | ICD-10-CM | POA: Diagnosis not present

## 2018-02-11 DIAGNOSIS — R1310 Dysphagia, unspecified: Secondary | ICD-10-CM

## 2018-02-11 LAB — NITRIC OXIDE: Nitric Oxide: 20

## 2018-02-11 MED ORDER — BUDESONIDE-FORMOTEROL FUMARATE 160-4.5 MCG/ACT IN AERO: 2.0000 | INHALATION_SPRAY | Freq: Two times a day (BID) | RESPIRATORY_TRACT | 6 refills | Status: AC

## 2018-02-11 NOTE — Patient Instructions (Signed)
Dysphasia: We will make arrangements for a modified barium swallow test at [facility] to see if you have any trouble swallowing after the stroke  Allergic rhinitis: Take Flonase 2 puffs each nostril daily  Shortness of breath: If you do not see improvement in this with the increased dose of Symbicort in the treatment of your sinus problem then on the next visit we will need to make arrangements for a CT scan of your chest and full pulmonary function test   Follow up with a nurse practitioner in 2 weeks

## 2018-02-11 NOTE — Progress Notes (Signed)
Subjective:   PATIENT ID: Sarah Duarte GENDER: female DOB: Dec 22, 1931, MRN: [id number] Synopsis: Referred in March 2019 for cough in the setting of possible asthma after a likely stroke in November or December 2018.  HPI  No chief complaint on file.  Sarah Duarte says that she has been "sick" for a long time and wants me to figure it out. She was hospitalized for pneumonia on October 16, 2017 with "double pneumonia" and during that hospitalization she was diagnosed with a "mild stroke".  When friends visits her she had confusion and difficulty with her speech.  She was released and went to rehab on December 1.  She came home on December 16 but the family noted that her cognition was not right so they had to take her to the family doctor.  He referred them to the ER where she had an MRI which confirmed that she had a stroke.  They ended up seeing Dr. Erlinda Hong for stroke and he agreed she had an acute stroke approximately one month prior to their visit.    She has been hospitalized since the original hospitalization in November, most recently she was hospitalized at [facility] for atrial fibrillation and difficulty breathing.  Her son provides most of the history today.    He says that she has been havin gmore trouble breathing and he says that she has been vomiting nearly every time she eats a meal and has been losing weight: has lost 13 pounds.  She coughs every now and then she will get a coughing spell while eating.    Past Medical History:  Diagnosis Date  . Adenomatous polyp    h/o  . Asthma   . COPD (chronic obstructive pulmonary disease) ([place])   . Diabetes ([place])   . Disorientation, unspecified   . GERD (gastroesophageal reflux disease)   . Hemorrhoids    intermittently,bleeding  . Hyperlipidemia   . Hypertension      Family History  Problem Relation Age of Onset  . COPD Father   . Heart failure Mother   . Cirrhosis Brother        alcohol related cirrhosis  . Lung cancer  Brother   . Colon cancer Neg Hx      Social History   Socioeconomic History  . Marital status: Widowed    Spouse name: Not on file  . Number of children: Not on file  . Years of education: Not on file  . Highest education level: Not on file  Social Needs  . Financial resource strain: Not on file  . Food insecurity - worry: Not on file  . Food insecurity - inability: Not on file  . Transportation needs - medical: Not on file  . Transportation needs - non-medical: Not on file  Occupational History  . Not on file  Tobacco Use  . Smoking status: Never Smoker  . Smokeless tobacco: Never Used  Substance and Sexual Activity  . Alcohol use: No  . Drug use: No  . Sexual activity: Not on file  Other Topics Concern  . Not on file  Social History Narrative   Has 2 children and has been married for 50 years.    Retired from [employer].     Allergies  Allergen Reactions  . Aspirin     Due to asthma   . Fosamax [Alendronate Sodium]   . Levaquin [Levofloxacin]   . Lipitor [Atorvastatin]   . Lisinopril   . Symbicort [Budesonide-Formoterol Fumarate]   .  Reglan [Metoclopramide] Other (See Comments)    Panic attacks     Outpatient Medications Prior to Visit  Medication Sig Dispense Refill  . acetaminophen (TYLENOL) 500 MG tablet Take 1,000 mg by mouth every 6 (six) hours as needed for mild pain or headache.    . ALPRAZolam (XANAX) 0.5 MG tablet Take 1 tablet (0.5 mg total) by mouth 2 (two) times daily as needed for anxiety. 2 tablet 0  . budesonide-formoterol (SYMBICORT) 80-4.5 MCG/ACT inhaler Inhale 1 puff into the lungs 2 (two) times daily.    . Calcium Carbonate-Vitamin D (CALTRATE 600+D) 600-400 MG-UNIT per tablet Take 1 tablet by mouth 2 (two) times daily.    Marland Kitchen diltiazem (CARDIZEM CD) 180 MG 24 hr capsule Take 1 capsule (180 mg total) by mouth daily. 30 capsule 6  . donepezil (ARICEPT) 10 MG tablet Take 1 tablet (10 mg total) by mouth at bedtime. 30 tablet 3  . donepezil  (ARICEPT) 5 MG tablet Take 1 tablet (5 mg total) by mouth daily with breakfast. 30 tablet 0  . ergocalciferol (VITAMIN D2) 50000 UNITS capsule Take 50,000 Units by mouth every 14 (fourteen) days.     Marland Kitchen glipiZIDE (GLUCOTROL) 5 MG tablet Take 1 tablet (5 mg total) by mouth daily before breakfast. 30 tablet 1  . guaiFENesin-dextromethorphan (ROBITUSSIN DM) 100-10 MG/5ML syrup Take 5 mLs by mouth 2 (two) times daily as needed for cough.    . insulin glargine (LANTUS) 100 UNIT/ML injection Inject 12 Units into the skin daily.    . metFORMIN (GLUCOPHAGE) 500 MG tablet Take 1,000 mg by mouth 2 (two) times daily with a meal.     . metoprolol tartrate (LOPRESSOR) 25 MG tablet Take 1 tablet (25 mg total) by mouth 2 (two) times daily. 60 tablet 1  . Omega-3 1000 MG CAPS Take 1 capsule by mouth daily.     . pantoprazole (PROTONIX) 40 MG tablet Take 40 mg by mouth daily.     . ranitidine (ZANTAC) 300 MG tablet Take 300 mg by mouth at bedtime.    . Rivaroxaban (XARELTO) 15 MG TABS tablet Take 1 tablet (15 mg total) by mouth daily with supper. 30 tablet 0  . rosuvastatin (CRESTOR) 5 MG tablet Take 2.5 mg by mouth daily.      . valsartan-hydrochlorothiazide (DIOVAN-HCT) 320-12.5 MG tablet Take 1 tablet by mouth daily.    . VENTOLIN HFA 108 (90 Base) MCG/ACT inhaler Inhale 1 puff into the lungs 3 (three) times daily.  10   No facility-administered medications prior to visit.     ROS    Objective:  Physical Exam   There were no vitals filed for this visit.  Gen: well appearing, no acute distress HENT: NCAT, OP clear, neck supple without masses Eyes: PERRL, EOMi Lymph: no cervical lymphadenopathy PULM: Crackles bases B CV: RRR, no mgr, no JVD GI: BS+, soft, nontender, no hsm Derm: no rash or skin breakdown MSK: normal bulk and tone Neuro: A&Ox4, CN II-XII intact, strength 5/5 in all 4 extremities Psyche: normal mood and affect   CBC    Component Value Date/Time   WBC 12.3 (H) 12/09/2017 2125    RBC 4.45 12/09/2017 2125   HGB 11.8 (L) 12/09/2017 2125   HCT 36.7 12/09/2017 2125   PLT 288 12/09/2017 2125   MCV 82.5 12/09/2017 2125   MCH 26.5 12/09/2017 2125   MCHC 32.2 12/09/2017 2125   RDW 14.3 12/09/2017 2125   LYMPHSABS 2.9 12/09/2017 2125   MONOABS 0.9 12/09/2017  2125   EOSABS 0.2 12/09/2017 2125   BASOSABS 0.0 12/09/2017 2125      Chest imaging: January 2019 chest x-ray images independently reviewed showing emphysema  PFT:  Labs:  Path:  Echo: January 2019 normal left ventricular systolic function LVEF 93-79% with mild left atrial dilation.  Heart Catheterization:  Records from her visit with cardiology in February 2019 reviewed where she was seen for A. fib and hypertension treated with metoprolol and diltiazem       Assessment & Plan:   No diagnosis found.  Discussion: Sarah Duarte comes to clinic today complaining of cough for several months in the setting of some shortness of breath.  This all occurred after a recent hospitalization for pneumonia and a new stroke.  The chest x-ray from January 2019 showed that the pneumonia had cleared up, but she continues to have symptoms of cough when she swallows.  I worry that she may have some dysphasia after the stroke.  Alternatively her symptoms could be caused by worsening asthma control.  I think her cough is also perpetuated by uncontrolled allergic rhinitis.  Of course, the differential diagnosis of dyspnea is broad and if our initial workup today and increased treatment for allergic rhinitis and asthma do not help then she may need to have a more thorough investigation with a CT scan of her chest and  pulmonary function test:  Asthma: We will check a test today called and exhaled nitric oxide test  We will increase the dose of Symbicort to 160/4.52 puffs twice a day Use albuterol 2 puffs every 4 hours as needed for chest tightness wheezing or shortness of breath  Dysphasia: We will make arrangements  for a modified barium swallow test at [facility] to see if you have any trouble swallowing after the stroke  Allergic rhinitis: Take Flonase 2 puffs each nostril daily  Shortness of breath: If you do not see improvement in this with the increased dose of Symbicort in the treatment of your sinus problem then on the next visit we will need to make arrangements for a CT scan of your chest and full pulmonary function test   Follow up with a nurse practitioner in 2 weeks    Current Outpatient Medications:  .  acetaminophen (TYLENOL) 500 MG tablet, Take 1,000 mg by mouth every 6 (six) hours as needed for mild pain or headache., Disp: , Rfl:  .  ALPRAZolam (XANAX) 0.5 MG tablet, Take 1 tablet (0.5 mg total) by mouth 2 (two) times daily as needed for anxiety., Disp: 2 tablet, Rfl: 0 .  budesonide-formoterol (SYMBICORT) 80-4.5 MCG/ACT inhaler, Inhale 1 puff into the lungs 2 (two) times daily., Disp: , Rfl:  .  Calcium Carbonate-Vitamin D (CALTRATE 600+D) 600-400 MG-UNIT per tablet, Take 1 tablet by mouth 2 (two) times daily., Disp: , Rfl:  .  diltiazem (CARDIZEM CD) 180 MG 24 hr capsule, Take 1 capsule (180 mg total) by mouth daily., Disp: 30 capsule, Rfl: 6 .  donepezil (ARICEPT) 10 MG tablet, Take 1 tablet (10 mg total) by mouth at bedtime., Disp: 30 tablet, Rfl: 3 .  donepezil (ARICEPT) 5 MG tablet, Take 1 tablet (5 mg total) by mouth daily with breakfast., Disp: 30 tablet, Rfl: 0 .  ergocalciferol (VITAMIN D2) 50000 UNITS capsule, Take 50,000 Units by mouth every 14 (fourteen) days. , Disp: , Rfl:  .  glipiZIDE (GLUCOTROL) 5 MG tablet, Take 1 tablet (5 mg total) by mouth daily before breakfast., Disp: 30 tablet, Rfl: 1 .  guaiFENesin-dextromethorphan (ROBITUSSIN DM) 100-10 MG/5ML syrup, Take 5 mLs by mouth 2 (two) times daily as needed for cough., Disp: , Rfl:  .  insulin glargine (LANTUS) 100 UNIT/ML injection, Inject 12 Units into the skin daily., Disp: , Rfl:  .  metFORMIN  (GLUCOPHAGE) 500 MG tablet, Take 1,000 mg by mouth 2 (two) times daily with a meal. , Disp: , Rfl:  .  metoprolol tartrate (LOPRESSOR) 25 MG tablet, Take 1 tablet (25 mg total) by mouth 2 (two) times daily., Disp: 60 tablet, Rfl: 1 .  Omega-3 1000 MG CAPS, Take 1 capsule by mouth daily. , Disp: , Rfl:  .  pantoprazole (PROTONIX) 40 MG tablet, Take 40 mg by mouth daily. , Disp: , Rfl:  .  ranitidine (ZANTAC) 300 MG tablet, Take 300 mg by mouth at bedtime., Disp: , Rfl:  .  Rivaroxaban (XARELTO) 15 MG TABS tablet, Take 1 tablet (15 mg total) by mouth daily with supper., Disp: 30 tablet, Rfl: 0 .  rosuvastatin (CRESTOR) 5 MG tablet, Take 2.5 mg by mouth daily.  , Disp: , Rfl:  .  valsartan-hydrochlorothiazide (DIOVAN-HCT) 320-12.5 MG tablet, Take 1 tablet by mouth daily., Disp: , Rfl:  .  VENTOLIN HFA 108 (90 Base) MCG/ACT inhaler, Inhale 1 puff into the lungs 3 (three) times daily., Disp: , Rfl: 10

## 2018-02-13 ENCOUNTER — Other Ambulatory Visit (HOSPITAL_COMMUNITY): Payer: Self-pay | Admitting: Pulmonary Disease

## 2018-02-13 DIAGNOSIS — R1310 Dysphagia, unspecified: Secondary | ICD-10-CM

## 2018-02-16 ENCOUNTER — Ambulatory Visit (HOSPITAL_COMMUNITY): Payer: Medicare Other

## 2018-02-16 ENCOUNTER — Ambulatory Visit (HOSPITAL_COMMUNITY)
Admission: RE | Admit: 2018-02-16 | Discharge: 2018-02-16 | Disposition: A | Discharge status: Home / Self Care | Payer: Medicare Other | Source: Ambulatory Visit | Attending: Pulmonary Disease | Admitting: Pulmonary Disease

## 2018-02-16 ENCOUNTER — Ambulatory Visit: Payer: Medicare Other | Admitting: Neurology

## 2018-02-16 ENCOUNTER — Encounter: Payer: Self-pay | Admitting: Neurology

## 2018-02-16 VITALS — BP 134/56 | HR 77 | Wt 150.6 lb

## 2018-02-16 DIAGNOSIS — R1310 Dysphagia, unspecified: Secondary | ICD-10-CM

## 2018-02-16 DIAGNOSIS — I10 Essential (primary) hypertension: Secondary | ICD-10-CM | POA: Diagnosis not present

## 2018-02-16 DIAGNOSIS — Z794 Long term (current) use of insulin: Secondary | ICD-10-CM

## 2018-02-16 DIAGNOSIS — E1159 Type 2 diabetes mellitus with other circulatory complications: Secondary | ICD-10-CM | POA: Diagnosis not present

## 2018-02-16 DIAGNOSIS — R9389 Abnormal findings on diagnostic imaging of other specified body structures: Secondary | ICD-10-CM | POA: Diagnosis not present

## 2018-02-16 DIAGNOSIS — G0490 Encephalitis and encephalomyelitis, unspecified: Secondary | ICD-10-CM

## 2018-02-16 DIAGNOSIS — R404 Transient alteration of awareness: Secondary | ICD-10-CM | POA: Diagnosis not present

## 2018-02-16 DIAGNOSIS — I480 Paroxysmal atrial fibrillation: Secondary | ICD-10-CM | POA: Diagnosis not present

## 2018-02-16 MED ORDER — QUETIAPINE FUMARATE 25 MG PO TABS
12.5000 mg | ORAL_TABLET | Freq: Every day | ORAL | 2 refills | Status: DC
Start: 2018-02-16 — End: 2018-07-13

## 2018-02-16 NOTE — Patient Instructions (Addendum)
-   continue Xarelto and crestor for stroke prevention - Follow up with your primary care physician for stroke risk factor modification. Recommend maintain blood pressure goal <130/80, diabetes with hemoglobin A1c goal below 7.0% and lipids with LDL cholesterol goal below 70 mg/dL.  - Continue follow-up with cardiologist Dr. Bronson Ing for afib - will repeat MRI with and without contrast in 2-3 weeks. - will do EEG to rule out seizure.   - continue aricept 10 mg daily. - check BP and glucose at home - diabetic diet and self exercise - add seroquel 12.5mg  at night to help for nightmare.  - follow up in 2 months with me.

## 2018-02-16 NOTE — Progress Notes (Signed)
NEUROLOGY CLINIC FOLLOW UP NOTE  NAME: Sarah Duarte DOB: 07-Apr-1932  HPI: Sarah Duarte is a 84 y.o. female with PMH of COPD, diabetes, hypertension, recent diagnosis of A. fib on Xarelto who presents as a new patient for a stroke.   History obtained mainly from the son.  He stated that patient was admitted in [facility] in November for pneumonia which was treated with prednisone.  During hospitalization, patient started to have confusion but no other focal neurological deficit.  At that time, it was considered as encephalopathy due to double pneumonia.  She was later discharged to rehab for 16 days.  She went home on 11/16/17, but son still noticed some confusion.  For example, not know how to check sugar level at home, not able to use remote control, short memory loss, intermittent word finding difficulties and gramma errors. Episode of trouble walking, anxious and panic attack. It was more pronounced and night with fatigue and less pronounced during the day.  had MRI 11/21/17 with and without contrast showed possible left inferior MCA subacute infarct with mild enhancement, recommend repeat MRI in 4-6 weeks.  MRA head and neck 11/24/17 showed unremarkable except 3 mm paraophthalmic aneurysm.  On 12/01/17, patient had abdominal pain, nausea, and was admitted to any pain, found to have atrial fibrillation with RVR.  Rate controlled by metoprolol and Cardizem.  Started anticoagulation with heparin IV first and then transition to Xarelto.  Also treated anxiety with Xanax.  Discharged on 12/04/17, however developed anxiety, panic attack, atypical chest pain on 12/09/17, sent to ER, rule out ACS before discharge home.  Patient has been following with cardiologist Dr. Bronson Ing in the past for paroxysmal ventricular tachycardia, but has no further appointment at this time.  Today, Patient came in with son, no acute distress, admitted that she sometimes feels anxious and panic, does not know why  that happened.  However paucity of speech with cognitive impairment on exam.  Patient son stated that he did not notice any significant cognitive impairment before pneumonia in 10/2017, but she does have some short memory difficulty but long-term memory is good.  She was living independently before this happened.  History of hypertension, on BP meds with losartan and HCTZ.  Glucose still fluctuating at home, on Lantus and glipizide as well as metformin.  COPD on inhalers and Symbicort.  Currently living at assisted living facility.  Interval history During the interval time, patient has been doing the same.  She still has intermittent anxiety episode, taking one-time Xanax, made symptoms worse.  Frequent nausea episodes, especially triggered by special smells.  For the last 1 months developed nightmare almost every night, and for the last 1 week he seems she had visual hallucinations.  However, it is difficult for her to differentiate vivid dreams versus visual hallucination.  Since last visit, she had repeat MRI without contrast in 12/2017 showed progression of left temporoparietal hyperintensity, and similar DWI diffusion changes comparing with the MRI in 11/2017.  The pattern was not consistent with stroke, but concerning for cerebritis versus vasculitis.  No need further work-up.    She is still on Xarelto, Crestor, Aricept as well as trazodone 50 mg nightly.  Monitoring the BP meds, she stated that her blood pressure and glucose in good control.  Today BP 139/56.    Past Medical History:  Diagnosis Date  . Adenomatous polyp    h/o  . Asthma   . COPD (chronic obstructive pulmonary disease) ([place])   .  Diabetes ([place])   . Disorientation, unspecified   . GERD (gastroesophageal reflux disease)   . Hemorrhoids    intermittently,bleeding  . Hyperlipidemia   . Hypertension   . Stroke [facility])    Past Surgical History:  Procedure Laterality Date  . BREAST BIOPSY     right,for benign disease  .  COLONOSCOPY  07/30/2002   NID:[id number] rectum/Transverse left-sided diverticulum/The remainder of the colonic mucosa appeared normal  . COLONOSCOPY N/A 09/14/2014   Procedure: COLONOSCOPY;  Surgeon: Daneil Dolin, MD;  Location: [facility];  Service: Endoscopy;  Laterality: N/A;  8:30 AM - moved to 9:30 - Ginger to notify pt  . TONSILLECTOMY     Family History  Problem Relation Age of Onset  . COPD Father   . Heart failure Mother   . Cirrhosis Brother        alcohol related cirrhosis  . Lung cancer Brother   . Colon cancer Neg Hx    Current Outpatient Medications  Medication Sig Dispense Refill  . acetaminophen (TYLENOL) 500 MG tablet Take 1,000 mg by mouth every 6 (six) hours as needed for mild pain or headache.    . ALPRAZolam (XANAX) 0.25 MG tablet   4  . budesonide-formoterol (SYMBICORT) 160-4.5 MCG/ACT inhaler Inhale 2 puffs into the lungs 2 (two) times daily. 1 Inhaler 6  . Calcium Carbonate-Vitamin D (CALTRATE 600+D) 600-400 MG-UNIT per tablet Take 1 tablet by mouth 2 (two) times daily.    Marland Kitchen diltiazem (CARDIZEM CD) 180 MG 24 hr capsule Take 1 capsule (180 mg total) by mouth daily. 30 capsule 6  . donepezil (ARICEPT) 10 MG tablet Take 1 tablet (10 mg total) by mouth at bedtime. 30 tablet 3  . ergocalciferol (VITAMIN D2) 50000 UNITS capsule Take 50,000 Units by mouth every 14 (fourteen) days.     Marland Kitchen glipiZIDE (GLUCOTROL) 5 MG tablet Take 1 tablet (5 mg total) by mouth daily before breakfast. 30 tablet 1  . guaiFENesin-dextromethorphan (ROBITUSSIN DM) 100-10 MG/5ML syrup Take 5 mLs by mouth 2 (two) times daily as needed for cough.    Marland Kitchen HYDROcodone-homatropine (HYCODAN) 5-1.5 MG/5ML syrup Take 5 mLs by mouth every 6 (six) hours as needed for cough.    . insulin glargine (LANTUS) 100 UNIT/ML injection Inject 12 Units into the skin daily.    Marland Kitchen loratadine (CLARITIN) 10 MG tablet Take 10 mg by mouth daily.    . metFORMIN (GLUCOPHAGE) 500 MG tablet Take 1,000 mg by mouth 2 (two) times  daily with a meal.     . metoprolol tartrate (LOPRESSOR) 25 MG tablet Take 1 tablet (25 mg total) by mouth 2 (two) times daily. 60 tablet 1  . Omega-3 1000 MG CAPS Take 1 capsule by mouth daily.     . ondansetron (ZOFRAN) 4 MG tablet Take 4 mg by mouth every 8 (eight) hours as needed for nausea or vomiting.    . pantoprazole (PROTONIX) 40 MG tablet Take 40 mg by mouth daily.     . ranitidine (ZANTAC) 300 MG tablet Take 300 mg by mouth at bedtime.    . Rivaroxaban (XARELTO) 15 MG TABS tablet Take 1 tablet (15 mg total) by mouth daily with supper. 30 tablet 0  . rosuvastatin (CRESTOR) 5 MG tablet Take 2.5 mg by mouth daily.      . valsartan-hydrochlorothiazide (DIOVAN-HCT) 320-12.5 MG tablet Take 1 tablet by mouth daily.    . VENTOLIN HFA 108 (90 Base) MCG/ACT inhaler Inhale 1 puff into the lungs 3 (three)  times daily.  10  . LANTUS SOLOSTAR 100 UNIT/ML Solostar Pen   10  . QUEtiapine (SEROQUEL) 25 MG tablet Take 0.5 tablets (12.5 mg total) by mouth at bedtime. 30 tablet 2  . traZODone (DESYREL) 50 MG tablet   10   No current facility-administered medications for this visit.    Allergies  Allergen Reactions  . Aspirin     Due to asthma   . Fosamax [Alendronate Sodium]   . Levaquin [Levofloxacin]   . Lipitor [Atorvastatin]   . Lisinopril   . Reglan [Metoclopramide] Other (See Comments)    Panic attacks   Social History   Socioeconomic History  . Marital status: Widowed    Spouse name: Not on file  . Number of children: Not on file  . Years of education: Not on file  . Highest education level: Not on file  Social Needs  . Financial resource strain: Not on file  . Food insecurity - worry: Not on file  . Food insecurity - inability: Not on file  . Transportation needs - medical: Not on file  . Transportation needs - non-medical: Not on file  Occupational History  . Not on file  Tobacco Use  . Smoking status: Never Smoker  . Smokeless tobacco: Never Used  Substance and Sexual  Activity  . Alcohol use: No  . Drug use: No  . Sexual activity: Not on file  Other Topics Concern  . Not on file  Social History Narrative   Has 2 children and has been married for 50 years.    Retired from [employer].    Review of Systems Full 14 system review of systems performed and notable only for those listed, all others are neg:  Constitutional: Fatigue, unexpected weight change, appetite change Cardiovascular:  Ear/Nose/Throat:   Skin:  Eyes:   Respiratory:  Gastroitestinal: Nausea vomiting Genitourinary:  Hematology/Lymphatic: Easy bleeding Endocrine: Feeling cold, feeling hot Musculoskeletal:   Allergy/Immunology: Allergies Neurological: Memory loss Psychiatric: Depression, anxiety, nervous, hallucinations, confusion, agitation Sleep: Frequent waking, daytime sleepiness, acting out dreams   Physical Exam  Vitals:   02/16/18 1545  BP: (!) 134/56  Pulse: 77    General - Well nourished, well developed, in no apparent distress.  Ophthalmologic - fundi not visualized due to noncooperation.  Cardiovascular - irregularly irregular heart rate and rhythm.   Mental Status -  Level of arousal and orientation to place, and person were intact, however not orientated to time. Language exam showed able to follow simple commands, naming 3/4, difficulty with repeating due to registration deficit. Attention span and concentration were impaired, not able to backward spelling world and not able to calculate. Recent and remote memory were impaired, 1/3 registration, 1/3 delayed recall. Fund of Knowledge was assessed and was impaired.  Cranial Nerves II - XII - II - Visual field intact OU. III, IV, VI - Extraocular movements intact. V - Facial sensation intact bilaterally. VII - Facial movement intact bilaterally. VIII - Hearing & vestibular intact bilaterally. X - Palate elevates symmetrically. XI - Chin turning & shoulder shrug intact bilaterally. XII - Tongue protrusion  intact.  Motor Strength - The patient's strength was normal in all extremities and pronator drift was absent.  Bulk was normal and fasciculations were absent.   Motor Tone - Muscle tone was assessed at the neck and appendages and was normal.  Reflexes - The patient's reflexes were normal in all extremities and she had no pathological reflexes.  Sensory - Light touch, temperature/pinprick were  assessed and were normal.    Coordination - The patient had normal movements in the hands with no ataxia or dysmetria.  Tremor was absent.  Gait and Station - walk without assistance, slow and small stripe.   Imaging  I have personally reviewed the radiological images below and agree with the radiology interpretations.  CT head 10/27/17 1.  No acute intracranial abnormality identified 2.  Stable mild chronic microvascular ischemic changes and mild parenchymal volume loss of the brain  MRI head without contrast 11/21/17 Background pattern of moderate chronic small vessel ischemic changes Region of low level restricted diffusion in the white matter at the left temporoparietal junction with sparing of overlying cortex.  This does not look likely ischemic infarction.  This could represent atypical infectious cerebritis or autoimmune cerebritis.  Recommend contrast-enhanced imaging to see if this enhances.  MRI head with contrast 11/21/17 Subacute left MCA territory infarction is favored.  Mild postcontrast cortical/intraventricular enhancement.  Recommend repeat MRI brain in 4-6 weeks to ensure resolution/normalization, given that this constellation of imaging finding is atypical for infarction.  MRA head 11/24/17 1.  Moderate motion degraded examination 2.  No flow limiting stenosis or large vessel occlusion. 3.  7 mm suspected left paraophthalmic aneurysm.  Recommend follow-up non-urgent CTA head for confirmation and characterization. 4.  Moderate PCA atherosclerosis and/or motion artifact  MRA  neck 11/24/17 1.  Mildly motion degraded examination. 2.  No hemodynamically significant stenosis 3.  Mild stenosis left V1 segment versus flow artifact  MRI w/o contrast 12/25/17 Progression of hyperintensity in the left parietal white matter.  Hyperintensity on diffusion is unchanged may be T2 shine through or pseudonormalization.  Differential diagnosis includes late subacute infarct, demyelinating disease and cerebritis.  Continued MRI follow-up is suggested, preferably with IV contrast.  Atrophy and chronic microvascular ischemia.  No other area of restricted diffusion.  Lab Review none   Assessment   In summary, Sarah Duarte is a 84 y.o. female with PMH of COPD, diabetes, hypertension, recent diagnosis of A. fib on Xarelto who presents for a stroke follow-up.  She started to have confusion during hospitalization in 10/2017, subsequent MRI showed possible left MCA infarct.  MRA head and neck unremarkable.  She was found to have A. fib RVR in 11/2017, put on Xarelto.  Still has confusion, short term memory loss, cognitive impairment, anxiety and intermittent panic attack.  Was treated with Xanax, but has been discontinued.  Continue Xarelto and Crestor for stroke prevention, add Aricept for cognitive impairment and anxiety with panic attack.    Repeat MRI w/o contrast in 12/2017 showed progression of left temporoparietal hyperintensity, and similar DWI diffusion changes comparing with the MRI in 11/2017.  The pattern was not consistent with stroke, but concerning for cerebritis versus vasculitis.  Patient neuropsych symptoms remains, and recently also started to have vivid dreams, nightmares, and visual hallucinations. Will need further work up to evaluate.   Plan: - continue Xarelto and crestor for stroke prevention - Follow up with your primary care physician for stroke risk factor modification. Recommend maintain blood pressure goal <130/80, diabetes with hemoglobin A1c goal below 7.0% and  lipids with LDL cholesterol goal below 70 mg/dL.  - Continue follow-up with cardiologist Dr. Bronson Ing for afib - will repeat MRI with and without contrast. - will do EEG to rule out seizure.   - will schedule for LP for further evaluation but need to stop Xarelto 48 hours before the procedure.  - CSF analysis to rule out  infection, inflammation, limbic encephalitis.  - will check labs to rule out hashimoto encephalitis, etc.  - continue aricept 10 mg daily. - check BP and glucose at home - diabetic diet and self exercise - add seroquel 12.5mg  at night to help for nightmare.  - follow up in 2 months with me.  A total of 45 minutes was spent face-to-face with this patient. Over half this time was spent on counseling patient on the diagnosis and different diagnostic of neuropsych impairment and therapeutic options available.     Orders Placed This Encounter  Procedures  . DG FLUORO GUIDED LOC OF NEEDLE/CATH TIP FOR SPINAL INJECT LT    Pt is on Xarelto. Please coordinate the timing. Pt can stop Xarelto 48 h prior and 24 h after procedure.    Standing Status:   Future    Standing Expiration Date:   05/19/2018    Order Specific Question:   Lab orders requested (DO NOT place separate lab orders, these will be automatically ordered during procedure specimen collection):    Answer:   CSF cell count w differential    Comments:   and crypto antigen and lyme antibody and west nile virus antibody    Order Specific Question:   Lab orders requested (DO NOT place separate lab orders, these will be automatically ordered during procedure specimen collection):    Answer:   CSF culture    Order Specific Question:   Lab orders requested (DO NOT place separate lab orders, these will be automatically ordered during procedure specimen collection):    Answer:   Gram stain    Order Specific Question:   Lab orders requested (DO NOT place separate lab orders, these will be automatically ordered during procedure  specimen collection):    Answer:   Protein and glucose, CSF    Order Specific Question:   Lab orders requested (DO NOT place separate lab orders, these will be automatically ordered during procedure specimen collection):    Answer:   VDRL, CSF    Order Specific Question:   Lab orders requested (DO NOT place separate lab orders, these will be automatically ordered during procedure specimen collection):    Answer:   Oligo clonal bands    Order Specific Question:   Reason for Exam (SYMPTOM  OR DIAGNOSIS REQUIRED)    Answer:   AMS and MRI concerning for cerebritis    Order Specific Question:   Preferred Imaging Location?    Answer:   [facility]    Order Specific Question:   Radiology Contrast Protocol - do NOT remove file path    Answer:   \\[email or web address]  . MR BRAIN W WO CONTRAST    Abnormal MRI in 21/2018 and 12/2017. Concerning for cerebritis.    Standing Status:   Future    Standing Expiration Date:   05/19/2018    Scheduling Instructions:     Schedule with [facility]) as pt lives close there. Thanks.    Order Specific Question:   If indicated for the ordered procedure, I authorize the administration of contrast media per Radiology protocol    Answer:   Yes    Order Specific Question:   What is the patient's sedation requirement?    Answer:   No Sedation    Order Specific Question:   Does the patient have a pacemaker or implanted devices?    Answer:   No    Order Specific Question:   Radiology Contrast Protocol - do NOT remove  file path    Answer:   \\[email or web address]    Order Specific Question:   Reason for Exam additional comments    Answer:   concerning for left tempoparietal cerebritis. thanks.    Order Specific Question:   Preferred imaging location?    Answer:   External  . Thyroid peroxidase antibody  . Thyroglobulin AB  . Sedimentation rate  . C-reactive protein  . ANA  . CBC  (no diff)  . Basic metabolic panel  . Cryptococcal Antigen, CSF  . West nile virus ab, IgG and IgM  . B. burgdorfi antibodies, CSF  (lyme antibody)  . EEG adult    Standing Status:   Future    Standing Expiration Date:   02/17/2019    Order Specific Question:   Where should this test be performed?    Answer:   [facility]    Meds ordered this encounter  Medications  . QUEtiapine (SEROQUEL) 25 MG tablet    Sig: Take 0.5 tablets (12.5 mg total) by mouth at bedtime.    Dispense:  30 tablet    Refill:  2    Patient Instructions  - continue Xarelto and crestor for stroke prevention - Follow up with your primary care physician for stroke risk factor modification. Recommend maintain blood pressure goal <130/80, diabetes with hemoglobin A1c goal below 7.0% and lipids with LDL cholesterol goal below 70 mg/dL.  - Continue follow-up with cardiologist Dr. Bronson Ing for afib - will repeat MRI with and without contrast in 2-3 weeks. - will do EEG to rule out seizure.   - continue aricept 10 mg daily. - check BP and glucose at home - diabetic diet and self exercise - add seroquel 12.5mg  at night to help for nightmare.  - follow up in 2 months with me.   Rosalin Hawking, MD PhD [facility] [address] [place], [place] [address] [phone]

## 2018-02-17 ENCOUNTER — Telehealth: Payer: Self-pay | Admitting: Neurology

## 2018-02-17 LAB — BASIC METABOLIC PANEL
BUN/Creatinine Ratio: 19 (ref 12–28)
BUN: 20 mg/dL (ref 8–27)
CHLORIDE: 99 mmol/L (ref 96–106)
CO2: 23 mmol/L (ref 20–29)
CREATININE: 1.08 mg/dL — AB (ref 0.57–1.00)
Calcium: 10.1 mg/dL (ref 8.7–10.3)
GFR calc Af Amer: 55 mL/min/{1.73_m2} — ABNORMAL LOW (ref >59)
GFR calc non Af Amer: 48 mL/min/{1.73_m2} — ABNORMAL LOW (ref >59)
GLUCOSE: 123 mg/dL — AB (ref 65–99)
Potassium: 4.6 mmol/L (ref 3.5–5.2)
SODIUM: 143 mmol/L (ref 134–144)

## 2018-02-17 LAB — CBC
HEMOGLOBIN: 12.2 g/dL (ref 11.1–15.9)
Hematocrit: 38.6 % (ref 34.0–46.6)
MCH: 24.7 pg — ABNORMAL LOW (ref 26.6–33.0)
MCHC: 31.6 g/dL (ref 31.5–35.7)
MCV: 78 fL — ABNORMAL LOW (ref 79–97)
PLATELETS: 261 10*3/uL (ref 150–379)
RBC: 4.94 x10E6/uL (ref 3.77–5.28)
RDW: 16.4 % — ABNORMAL HIGH (ref 12.3–15.4)
WBC: 9.3 10*3/uL (ref 3.4–10.8)

## 2018-02-17 NOTE — Telephone Encounter (Signed)
Patient is scheduled at [facility] for Thursday 02/19/18 arrival time is 2:00 pm. If she has not had the BUN & Creatine she will have to arrive at 1:30 pm to get those labs done. If she hasn't had it done I will need a lab order put in for me to fax it along with the MRI order to [place] Medicare auth: [id number] (exp. 02/17/18 to 03/18/18)

## 2018-02-17 NOTE — Telephone Encounter (Signed)
Will do thank you

## 2018-02-17 NOTE — Telephone Encounter (Signed)
Patient son Sarah Duarte is aware of time & day.

## 2018-02-17 NOTE — Telephone Encounter (Signed)
Hi, Emily, please let them know that I did CBC and BMP on her yesterday and the result showed Cre 1.08 and BUN 20. No need new labs. Please tell them. Thanks.   Rosalin Hawking, MD PhD Stroke Neurology 02/17/2018 2:10 PM

## 2018-02-19 ENCOUNTER — Other Ambulatory Visit: Payer: Self-pay

## 2018-02-19 ENCOUNTER — Telehealth: Payer: Self-pay

## 2018-02-19 MED ORDER — ALPRAZOLAM 0.5 MG PO TABS: 0.5000 mg | ORAL_TABLET | Freq: Two times a day (BID) | ORAL | 0 refills | Status: DC | PRN

## 2018-02-19 NOTE — Telephone Encounter (Signed)
Rn call patients son Richardson Landry about his mom could not complete the MRi due to being claustrophobic. Rn stated a xanax of 2 pills can be fax. Rn stated the pharmacy is the nursing home. The son states his mom has xanax on file at the facility. He stated our rx is not needed at this time. He can call the facility, and request they give her the xanax on her medication list prior to the [facility]. He will coordinate the rescheduling of the MRI test. Rn stated if they cannot supply the xanax just for the testing, the rx will be at the office for him to pick up or it can be fax to [place] drug store.The son will call if he has any issues,and verbalized understanding.

## 2018-02-19 NOTE — Telephone Encounter (Signed)
Rn receive a call from [facility] that pt is refusing to get it until she gets something for anxiety.Rn stated pt was here on Monday with son,and did not mention needing anything for anxiety for the test. Sarah Duarte stated pt thought she could some at the testing site. Sarah Duarte stated she told patient that that only happens when she is in hospitalized only. Rn stated pt will have to reschedule,and 2 xanax quantity will be fax to her pharmacy. Sarah Duarte will notify pt of this.

## 2018-02-19 NOTE — Telephone Encounter (Signed)
[facility] called, pt is there to have MRI but is refusing it until she gets something for anxiety. Call transferred to RN

## 2018-02-20 ENCOUNTER — Inpatient Hospital Stay (HOSPITAL_COMMUNITY)
Admission: EM | Admit: 2018-02-20 | Discharge: 2018-02-24 | DRG: 065 | Disposition: A | Discharge status: Home Health | Payer: Medicare Other | Attending: Internal Medicine | Admitting: Internal Medicine

## 2018-02-20 ENCOUNTER — Encounter (HOSPITAL_COMMUNITY): Payer: Self-pay | Admitting: Emergency Medicine

## 2018-02-20 ENCOUNTER — Emergency Department (HOSPITAL_COMMUNITY): Payer: Medicare Other

## 2018-02-20 DIAGNOSIS — I10 Essential (primary) hypertension: Secondary | ICD-10-CM | POA: Diagnosis not present

## 2018-02-20 DIAGNOSIS — R4781 Slurred speech: Secondary | ICD-10-CM

## 2018-02-20 DIAGNOSIS — E118 Type 2 diabetes mellitus with unspecified complications: Secondary | ICD-10-CM | POA: Diagnosis not present

## 2018-02-20 DIAGNOSIS — Z888 Allergy status to other drugs, medicaments and biological substances status: Secondary | ICD-10-CM

## 2018-02-20 DIAGNOSIS — Z881 Allergy status to other antibiotic agents status: Secondary | ICD-10-CM

## 2018-02-20 DIAGNOSIS — I129 Hypertensive chronic kidney disease with stage 1 through stage 4 chronic kidney disease, or unspecified chronic kidney disease: Secondary | ICD-10-CM | POA: Diagnosis present

## 2018-02-20 DIAGNOSIS — Z79899 Other long term (current) drug therapy: Secondary | ICD-10-CM

## 2018-02-20 DIAGNOSIS — R4701 Aphasia: Secondary | ICD-10-CM | POA: Diagnosis present

## 2018-02-20 DIAGNOSIS — Z801 Family history of malignant neoplasm of trachea, bronchus and lung: Secondary | ICD-10-CM

## 2018-02-20 DIAGNOSIS — N183 Chronic kidney disease, stage 3 unspecified: Secondary | ICD-10-CM | POA: Diagnosis present

## 2018-02-20 DIAGNOSIS — Z515 Encounter for palliative care: Secondary | ICD-10-CM

## 2018-02-20 DIAGNOSIS — E119 Type 2 diabetes mellitus without complications: Secondary | ICD-10-CM

## 2018-02-20 DIAGNOSIS — Z7951 Long term (current) use of inhaled steroids: Secondary | ICD-10-CM

## 2018-02-20 DIAGNOSIS — R402142 Coma scale, eyes open, spontaneous, at arrival to emergency department: Secondary | ICD-10-CM | POA: Diagnosis present

## 2018-02-20 DIAGNOSIS — Z8673 Personal history of transient ischemic attack (TIA), and cerebral infarction without residual deficits: Secondary | ICD-10-CM

## 2018-02-20 DIAGNOSIS — F0390 Unspecified dementia without behavioral disturbance: Secondary | ICD-10-CM | POA: Diagnosis present

## 2018-02-20 DIAGNOSIS — R402352 Coma scale, best motor response, localizes pain, at arrival to emergency department: Secondary | ICD-10-CM | POA: Diagnosis present

## 2018-02-20 DIAGNOSIS — E785 Hyperlipidemia, unspecified: Secondary | ICD-10-CM | POA: Diagnosis present

## 2018-02-20 DIAGNOSIS — I480 Paroxysmal atrial fibrillation: Secondary | ICD-10-CM | POA: Diagnosis present

## 2018-02-20 DIAGNOSIS — R402232 Coma scale, best verbal response, inappropriate words, at arrival to emergency department: Secondary | ICD-10-CM | POA: Diagnosis present

## 2018-02-20 DIAGNOSIS — J449 Chronic obstructive pulmonary disease, unspecified: Secondary | ICD-10-CM | POA: Diagnosis present

## 2018-02-20 DIAGNOSIS — F419 Anxiety disorder, unspecified: Secondary | ICD-10-CM | POA: Diagnosis present

## 2018-02-20 DIAGNOSIS — E1122 Type 2 diabetes mellitus with diabetic chronic kidney disease: Secondary | ICD-10-CM | POA: Diagnosis present

## 2018-02-20 DIAGNOSIS — Z794 Long term (current) use of insulin: Secondary | ICD-10-CM

## 2018-02-20 DIAGNOSIS — G9349 Other encephalopathy: Secondary | ICD-10-CM | POA: Diagnosis present

## 2018-02-20 DIAGNOSIS — J438 Other emphysema: Secondary | ICD-10-CM | POA: Diagnosis not present

## 2018-02-20 DIAGNOSIS — Z7901 Long term (current) use of anticoagulants: Secondary | ICD-10-CM

## 2018-02-20 DIAGNOSIS — I639 Cerebral infarction, unspecified: Secondary | ICD-10-CM | POA: Diagnosis not present

## 2018-02-20 DIAGNOSIS — Z8249 Family history of ischemic heart disease and other diseases of the circulatory system: Secondary | ICD-10-CM

## 2018-02-20 DIAGNOSIS — R29707 NIHSS score 7: Secondary | ICD-10-CM | POA: Diagnosis present

## 2018-02-20 DIAGNOSIS — Z825 Family history of asthma and other chronic lower respiratory diseases: Secondary | ICD-10-CM

## 2018-02-20 DIAGNOSIS — R0602 Shortness of breath: Secondary | ICD-10-CM

## 2018-02-20 DIAGNOSIS — G40209 Localization-related (focal) (partial) symptomatic epilepsy and epileptic syndromes with complex partial seizures, not intractable, without status epilepticus: Secondary | ICD-10-CM | POA: Diagnosis present

## 2018-02-20 DIAGNOSIS — Z7189 Other specified counseling: Secondary | ICD-10-CM

## 2018-02-20 DIAGNOSIS — R569 Unspecified convulsions: Secondary | ICD-10-CM

## 2018-02-20 DIAGNOSIS — R112 Nausea with vomiting, unspecified: Secondary | ICD-10-CM

## 2018-02-20 DIAGNOSIS — K219 Gastro-esophageal reflux disease without esophagitis: Secondary | ICD-10-CM | POA: Diagnosis present

## 2018-02-20 HISTORY — DX: Nausea: R11.0

## 2018-02-20 LAB — RAPID URINE DRUG SCREEN, HOSP PERFORMED
Amphetamines: NOT DETECTED
Barbiturates: NOT DETECTED
Benzodiazepines: NOT DETECTED
Cocaine: NOT DETECTED
Opiates: POSITIVE — AB
TETRAHYDROCANNABINOL: NOT DETECTED

## 2018-02-20 LAB — I-STAT CHEM 8, ED
BUN: 23 mg/dL — ABNORMAL HIGH (ref 6–20)
CALCIUM ION: 1.18 mmol/L (ref 1.15–1.40)
CHLORIDE: 100 mmol/L — AB (ref 101–111)
Creatinine, Ser: 1.2 mg/dL — ABNORMAL HIGH (ref 0.44–1.00)
Glucose, Bld: 147 mg/dL — ABNORMAL HIGH (ref 65–99)
HEMATOCRIT: 39 % (ref 36.0–46.0)
Hemoglobin: 13.3 g/dL (ref 12.0–15.0)
Potassium: 4.3 mmol/L (ref 3.5–5.1)
Sodium: 139 mmol/L (ref 135–145)
TCO2: 25 mmol/L (ref 22–32)

## 2018-02-20 LAB — COMPREHENSIVE METABOLIC PANEL
ALBUMIN: 3.8 g/dL (ref 3.5–5.0)
ALT: 12 U/L — ABNORMAL LOW (ref 14–54)
ANION GAP: 15 (ref 5–15)
AST: 27 U/L (ref 15–41)
Alkaline Phosphatase: 48 U/L (ref 38–126)
BUN: 25 mg/dL — AB (ref 6–20)
CHLORIDE: 101 mmol/L (ref 101–111)
CO2: 23 mmol/L (ref 22–32)
CREATININE: 1.28 mg/dL — AB (ref 0.44–1.00)
Calcium: 10 mg/dL (ref 8.9–10.3)
GFR calc non Af Amer: 38 mL/min — ABNORMAL LOW (ref >60)
GFR, EST AFRICAN AMERICAN: 44 mL/min — AB (ref >60)
GLUCOSE: 152 mg/dL — AB (ref 65–99)
Potassium: 4.3 mmol/L (ref 3.5–5.1)
SODIUM: 139 mmol/L (ref 135–145)
TOTAL PROTEIN: 7.3 g/dL (ref 6.5–8.1)
Total Bilirubin: 0.4 mg/dL (ref 0.3–1.2)

## 2018-02-20 LAB — URINALYSIS, ROUTINE W REFLEX MICROSCOPIC
BILIRUBIN URINE: NEGATIVE
Bacteria, UA: NONE SEEN
GLUCOSE, UA: 150 mg/dL — AB
KETONES UR: 5 mg/dL — AB
LEUKOCYTES UA: NEGATIVE
Nitrite: NEGATIVE
Protein, ur: 100 mg/dL — AB
SPECIFIC GRAVITY, URINE: 1.04 — AB (ref 1.005–1.030)
Squamous Epithelial / LPF: NONE SEEN
pH: 5 (ref 5.0–8.0)

## 2018-02-20 LAB — DIFFERENTIAL
BASOS PCT: 1 %
Basophils Absolute: 0 10*3/uL (ref 0.0–0.1)
EOS PCT: 2 %
Eosinophils Absolute: 0.2 10*3/uL (ref 0.0–0.7)
Lymphocytes Relative: 33 %
Lymphs Abs: 2.8 10*3/uL (ref 0.7–4.0)
MONO ABS: 0.9 10*3/uL (ref 0.1–1.0)
Monocytes Relative: 11 %
NEUTROS PCT: 53 %
Neutro Abs: 4.5 10*3/uL (ref 1.7–7.7)

## 2018-02-20 LAB — CBC
HCT: 39.3 % (ref 36.0–46.0)
Hemoglobin: 12.4 g/dL (ref 12.0–15.0)
MCH: 25.2 pg — AB (ref 26.0–34.0)
MCHC: 31.6 g/dL (ref 30.0–36.0)
MCV: 79.7 fL (ref 78.0–100.0)
PLATELETS: 266 10*3/uL (ref 150–400)
RBC: 4.93 MIL/uL (ref 3.87–5.11)
RDW: 17.2 % — ABNORMAL HIGH (ref 11.5–15.5)
WBC: 8.5 10*3/uL (ref 4.0–10.5)

## 2018-02-20 LAB — PROTIME-INR
INR: 1.73
PROTHROMBIN TIME: 20.1 s — AB (ref 11.4–15.2)

## 2018-02-20 LAB — I-STAT TROPONIN, ED: TROPONIN I, POC: 0.01 ng/mL (ref 0.00–0.08)

## 2018-02-20 LAB — ETHANOL

## 2018-02-20 LAB — APTT: aPTT: 37 seconds — ABNORMAL HIGH (ref 24–36)

## 2018-02-20 MED ORDER — ASPIRIN 325 MG PO TABS
325.0000 mg | ORAL_TABLET | Freq: Every day | ORAL | Status: DC
  Filled 2018-02-20 (×2): qty 1

## 2018-02-20 MED ORDER — ROSUVASTATIN CALCIUM 5 MG PO TABS
2.5000 mg | ORAL_TABLET | Freq: Every day | ORAL | Status: DC
  Administered 2018-02-23: 2.5 mg via ORAL
  Filled 2018-02-20: qty 0.5
  Filled 2018-02-20: qty 1
  Filled 2018-02-20 (×3): qty 0.5

## 2018-02-20 MED ORDER — FAMOTIDINE 20 MG PO TABS
20.0000 mg | ORAL_TABLET | Freq: Every day | ORAL | Status: DC
  Administered 2018-02-21 – 2018-02-23 (×4): 20 mg via ORAL
  Filled 2018-02-20 (×4): qty 1

## 2018-02-20 MED ORDER — STROKE: EARLY STAGES OF RECOVERY BOOK
Freq: Once | Status: DC
  Filled 2018-02-20: qty 1

## 2018-02-20 MED ORDER — INSULIN GLARGINE 100 UNIT/ML ~~LOC~~ SOLN
8.0000 [IU] | Freq: Every day | SUBCUTANEOUS | Status: DC
  Administered 2018-02-21: 8 [IU] via SUBCUTANEOUS
  Filled 2018-02-20 (×3): qty 0.08

## 2018-02-20 MED ORDER — IOPAMIDOL (ISOVUE-370) INJECTION 76%
100.0000 mL | Freq: Once | INTRAVENOUS | Status: AC | PRN
  Administered 2018-02-20: 100 mL via INTRAVENOUS

## 2018-02-20 MED ORDER — DILTIAZEM HCL ER COATED BEADS 180 MG PO CP24
180.0000 mg | ORAL_CAPSULE | Freq: Every day | ORAL | Status: DC
  Administered 2018-02-21 – 2018-02-24 (×4): 180 mg via ORAL
  Filled 2018-02-20 (×6): qty 1

## 2018-02-20 MED ORDER — ASPIRIN 300 MG RE SUPP
300.0000 mg | Freq: Every day | RECTAL | Status: DC
  Filled 2018-02-20: qty 1

## 2018-02-20 MED ORDER — ALPRAZOLAM 0.5 MG PO TABS
0.5000 mg | ORAL_TABLET | Freq: Two times a day (BID) | ORAL | Status: DC | PRN
  Administered 2018-02-23: 0.5 mg via ORAL
  Filled 2018-02-20: qty 1

## 2018-02-20 MED ORDER — LORAZEPAM 2 MG/ML IJ SOLN
1.0000 mg | Freq: Once | INTRAMUSCULAR | Status: AC
  Administered 2018-02-20: 1 mg via INTRAVENOUS
  Filled 2018-02-20: qty 1

## 2018-02-20 MED ORDER — ONDANSETRON HCL 4 MG/2ML IJ SOLN
4.0000 mg | Freq: Once | INTRAMUSCULAR | Status: AC
  Administered 2018-02-20: 4 mg via INTRAVENOUS
  Filled 2018-02-20: qty 2

## 2018-02-20 MED ORDER — SENNOSIDES-DOCUSATE SODIUM 8.6-50 MG PO TABS
1.0000 | ORAL_TABLET | Freq: Every evening | ORAL | Status: DC | PRN
  Filled 2018-02-20: qty 1

## 2018-02-20 MED ORDER — GUAIFENESIN-DM 100-10 MG/5ML PO SYRP
5.0000 mL | ORAL_SOLUTION | Freq: Two times a day (BID) | ORAL | Status: DC | PRN
  Administered 2018-02-21 – 2018-02-22 (×2): 5 mL via ORAL
  Filled 2018-02-20 (×3): qty 5

## 2018-02-20 MED ORDER — ONDANSETRON HCL 4 MG/2ML IJ SOLN: 4.0000 mg | Freq: Four times a day (QID) | INTRAMUSCULAR | Status: DC | PRN

## 2018-02-20 MED ORDER — SODIUM CHLORIDE 0.9% FLUSH: 3.0000 mL | INTRAVENOUS | Status: DC | PRN

## 2018-02-20 MED ORDER — ALBUTEROL SULFATE (2.5 MG/3ML) 0.083% IN NEBU: 2.5000 mg | INHALATION_SOLUTION | RESPIRATORY_TRACT | Status: DC | PRN

## 2018-02-20 MED ORDER — LORATADINE 10 MG PO TABS
10.0000 mg | ORAL_TABLET | Freq: Every day | ORAL | Status: DC
  Administered 2018-02-21 – 2018-02-24 (×4): 10 mg via ORAL
  Filled 2018-02-20 (×4): qty 1

## 2018-02-20 MED ORDER — IPRATROPIUM-ALBUTEROL 0.5-2.5 (3) MG/3ML IN SOLN
3.0000 mL | Freq: Once | RESPIRATORY_TRACT | Status: AC
  Administered 2018-02-20: 3 mL via RESPIRATORY_TRACT
  Filled 2018-02-20: qty 3

## 2018-02-20 MED ORDER — TRAZODONE HCL 50 MG PO TABS
50.0000 mg | ORAL_TABLET | Freq: Every day | ORAL | Status: DC
  Administered 2018-02-21 – 2018-02-23 (×4): 50 mg via ORAL
  Filled 2018-02-20 (×4): qty 1

## 2018-02-20 MED ORDER — ENOXAPARIN SODIUM 40 MG/0.4ML ~~LOC~~ SOLN
40.0000 mg | SUBCUTANEOUS | Status: DC
  Administered 2018-02-21: 40 mg via SUBCUTANEOUS
  Filled 2018-02-20: qty 0.4

## 2018-02-20 MED ORDER — ACETAMINOPHEN 650 MG RE SUPP: 650.0000 mg | RECTAL | Status: DC | PRN

## 2018-02-20 MED ORDER — DONEPEZIL HCL 5 MG PO TABS
10.0000 mg | ORAL_TABLET | Freq: Every day | ORAL | Status: DC
  Administered 2018-02-21 – 2018-02-23 (×4): 10 mg via ORAL
  Filled 2018-02-20 (×3): qty 2
  Filled 2018-02-20 (×2): qty 1
  Filled 2018-02-20: qty 2
  Filled 2018-02-20: qty 1

## 2018-02-20 MED ORDER — MOMETASONE FURO-FORMOTEROL FUM 200-5 MCG/ACT IN AERO
2.0000 | INHALATION_SPRAY | Freq: Two times a day (BID) | RESPIRATORY_TRACT | Status: DC
  Administered 2018-02-21 – 2018-02-24 (×7): 2 via RESPIRATORY_TRACT
  Filled 2018-02-20: qty 8.8

## 2018-02-20 MED ORDER — CALCIUM CARBONATE-VITAMIN D 500-200 MG-UNIT PO TABS
1.0000 | ORAL_TABLET | Freq: Two times a day (BID) | ORAL | Status: DC
  Administered 2018-02-21 – 2018-02-24 (×5): 1 via ORAL
  Filled 2018-02-20 (×15): qty 1

## 2018-02-20 MED ORDER — PANTOPRAZOLE SODIUM 40 MG PO TBEC
40.0000 mg | DELAYED_RELEASE_TABLET | Freq: Every day | ORAL | Status: DC
  Administered 2018-02-21 – 2018-02-24 (×4): 40 mg via ORAL
  Filled 2018-02-20 (×4): qty 1

## 2018-02-20 MED ORDER — INSULIN ASPART 100 UNIT/ML ~~LOC~~ SOLN
0.0000 [IU] | Freq: Every day | SUBCUTANEOUS | Status: DC
  Administered 2018-02-21: 2 [IU] via SUBCUTANEOUS
  Filled 2018-02-20: qty 1

## 2018-02-20 MED ORDER — QUETIAPINE 12.5 MG HALF TABLET
12.5000 mg | ORAL_TABLET | Freq: Every day | ORAL | Status: DC
  Administered 2018-02-21 – 2018-02-22 (×3): 12.5 mg via ORAL
  Filled 2018-02-20 (×5): qty 1

## 2018-02-20 MED ORDER — SODIUM CHLORIDE 0.9 % IV SOLN: 250.0000 mL | INTRAVENOUS | Status: DC | PRN

## 2018-02-20 MED ORDER — ACETAMINOPHEN 160 MG/5ML PO SOLN: 650.0000 mg | ORAL | Status: DC | PRN

## 2018-02-20 MED ORDER — INSULIN ASPART 100 UNIT/ML ~~LOC~~ SOLN
0.0000 [IU] | Freq: Three times a day (TID) | SUBCUTANEOUS | Status: DC
  Administered 2018-02-21: 2 [IU] via SUBCUTANEOUS
  Administered 2018-02-22: 1 [IU] via SUBCUTANEOUS
  Administered 2018-02-23: 2 [IU] via SUBCUTANEOUS
  Administered 2018-02-23: 3 [IU] via SUBCUTANEOUS
  Administered 2018-02-23: 1 [IU] via SUBCUTANEOUS
  Administered 2018-02-24: 2 [IU] via SUBCUTANEOUS
  Administered 2018-02-24: 9 [IU] via SUBCUTANEOUS

## 2018-02-20 MED ORDER — ACETAMINOPHEN 325 MG PO TABS
650.0000 mg | ORAL_TABLET | ORAL | Status: DC | PRN
  Administered 2018-02-22: 650 mg via ORAL
  Filled 2018-02-20: qty 2

## 2018-02-20 MED ORDER — OMEGA-3-ACID ETHYL ESTERS 1 G PO CAPS
1000.0000 mg | ORAL_CAPSULE | Freq: Every day | ORAL | Status: DC
  Administered 2018-02-23 – 2018-02-24 (×2): 1000 mg via ORAL
  Filled 2018-02-20 (×5): qty 1

## 2018-02-20 MED ORDER — SODIUM CHLORIDE 0.9% FLUSH
3.0000 mL | Freq: Two times a day (BID) | INTRAVENOUS | Status: DC
  Administered 2018-02-21 – 2018-02-24 (×8): 3 mL via INTRAVENOUS

## 2018-02-20 NOTE — ED Notes (Signed)
Patient vomited approx 200cc of partially undigested food. Patient reports nausea. EDP aware-verbal order given.

## 2018-02-20 NOTE — ED Provider Notes (Signed)
[facility] EMERGENCY DEPARTMENT Provider Note   CSN: [id number] Arrival date & time: 02/20/18  1743   An emergency department physician performed an initial assessment on this suspected stroke patient at 1746.  History   Chief Complaint Chief Complaint  Patient presents with  . Code Stroke    HPI Sarah Duarte is a 84 y.o. female.  The history is provided by the nursing home and the EMS personnel. The history is limited by the condition of the patient (Hx dementia).  Pt was seen at 1745. Per EMS and NH report: Pt LKW 1600. NH staff went into pt's room 1615 and she was "confused" and not answering questions appropriately (expressive aphasia), which is not her baseline. Pt currently confused and not answering questions appropriately on arrival to ED. Code Stroke called.   Past Medical History:  Diagnosis Date  . Adenomatous polyp    h/o  . Asthma   . Chronic nausea   . COPD (chronic obstructive pulmonary disease) ([place])   . Diabetes ([place])   . Disorientation, unspecified   . GERD (gastroesophageal reflux disease)   . Hemorrhoids    intermittently,bleeding  . Hyperlipidemia   . Hypertension   . Stroke [facility])     Patient Active Problem List   Diagnosis Date Noted  . Abnormal MRI 02/16/2018  . Cerebritis 02/16/2018  . Transient alteration of awareness 02/16/2018  . Cerebrovascular accident (CVA) due to embolism of left middle cerebral artery ([place]) 12/11/2017  . Anxiety 12/11/2017  . Panic attack 12/11/2017  . Hypomagnesemia 12/02/2017  . Paroxysmal atrial fibrillation ([place]) 12/01/2017  . COPD (chronic obstructive pulmonary disease) ([place]) 12/01/2017  . Adenomatous polyps 08/18/2014  . Chronic cough 06/02/2012  . Diabetes mellitus ([place]) 08/14/2009  . Essential hypertension 08/14/2009  . VENTRICULAR TACHYCARDIA, PAROXYSMAL 08/14/2009  . GERD 08/14/2009  . PALPITATIONS, HX OF 08/14/2009  . Hyperlipidemia 08/11/2009  . HEMORRHOIDS 08/11/2009    Past Surgical  History:  Procedure Laterality Date  . BREAST BIOPSY     right,for benign disease  . COLONOSCOPY  07/30/2002   QVZ:DGLOVF rectum/Transverse left-sided diverticulum/The remainder of the colonic mucosa appeared normal  . COLONOSCOPY N/A 09/14/2014   Procedure: COLONOSCOPY;  Surgeon: Daneil Dolin, MD;  Location: [facility] ENDO SUITE;  Service: Endoscopy;  Laterality: N/A;  8:30 AM - moved to 9:30 - Ginger to notify pt  . TONSILLECTOMY       OB History   None      Home Medications    Prior to Admission medications   Medication Sig Start Date End Date Taking? Authorizing Provider  acetaminophen (TYLENOL) 500 MG tablet Take 1,000 mg by mouth every 6 (six) hours as needed for mild pain or headache.    [provider]  ALPRAZolam Duanne Moron) 0.25 MG tablet  01/13/18   [provider]  ALPRAZolam Duanne Moron) 0.5 MG tablet Take 1 tablet (0.5 mg total) by mouth 2 (two) times daily as needed for anxiety. 02/19/18   Melvenia Beam, MD  budesonide-formoterol (SYMBICORT) 160-4.5 MCG/ACT inhaler Inhale 2 puffs into the lungs 2 (two) times daily. 02/11/18   Juanito Doom, MD  Calcium Carbonate-Vitamin D (CALTRATE 600+D) 600-400 MG-UNIT per tablet Take 1 tablet by mouth 2 (two) times daily.    [provider]  diltiazem (CARDIZEM CD) 180 MG 24 hr capsule Take 1 capsule (180 mg total) by mouth daily. 01/19/18   Herminio Commons, MD  donepezil (ARICEPT) 10 MG tablet Take 1 tablet (10 mg total) by  mouth at bedtime. 01/10/18   Rosalin Hawking, MD  ergocalciferol (VITAMIN D2) 50000 UNITS capsule Take 50,000 Units by mouth every 14 (fourteen) days.     [provider]  glipiZIDE (GLUCOTROL) 5 MG tablet Take 1 tablet (5 mg total) by mouth daily before breakfast. 12/04/17   Tat, Shanon Brow, MD  guaiFENesin-dextromethorphan (ROBITUSSIN DM) 100-10 MG/5ML syrup Take 5 mLs by mouth 2 (two) times daily as needed for cough.    [provider]  HYDROcodone-homatropine (HYCODAN) 5-1.5 MG/5ML  syrup Take 5 mLs by mouth every 6 (six) hours as needed for cough.    [provider]  insulin glargine (LANTUS) 100 UNIT/ML injection Inject 12 Units into the skin daily.    [provider]  LANTUS SOLOSTAR 100 UNIT/ML Solostar Pen  02/11/18   [provider]  loratadine (CLARITIN) 10 MG tablet Take 10 mg by mouth daily.    [provider]  metFORMIN (GLUCOPHAGE) 500 MG tablet Take 1,000 mg by mouth 2 (two) times daily with a meal.     [provider]  metoprolol tartrate (LOPRESSOR) 25 MG tablet Take 1 tablet (25 mg total) by mouth 2 (two) times daily. 12/04/17   Orson Eva, MD  Omega-3 1000 MG CAPS Take 1 capsule by mouth daily.     [provider]  ondansetron (ZOFRAN) 4 MG tablet Take 4 mg by mouth every 8 (eight) hours as needed for nausea or vomiting.    [provider]  pantoprazole (PROTONIX) 40 MG tablet Take 40 mg by mouth daily.  07/06/13   [provider]  QUEtiapine (SEROQUEL) 25 MG tablet Take 0.5 tablets (12.5 mg total) by mouth at bedtime. 02/16/18   Rosalin Hawking, MD  ranitidine (ZANTAC) 300 MG tablet Take 300 mg by mouth at bedtime.    [provider]  Rivaroxaban (XARELTO) 15 MG TABS tablet Take 1 tablet (15 mg total) by mouth daily with supper. 12/04/17   Orson Eva, MD  rosuvastatin (CRESTOR) 5 MG tablet Take 2.5 mg by mouth daily.      [provider]  traZODone (DESYREL) 50 MG tablet  02/09/18   [provider]  valsartan-hydrochlorothiazide (DIOVAN-HCT) 320-12.5 MG tablet Take 1 tablet by mouth daily.    [provider]  VENTOLIN HFA 108 (90 Base) MCG/ACT inhaler Inhale 1 puff into the lungs 3 (three) times daily. 11/27/17   [provider]    Family History Family History  Problem Relation Age of Onset  . COPD Father   . Heart failure Mother   . Cirrhosis Brother        alcohol related cirrhosis  . Lung cancer Brother   . Colon cancer Neg Hx     Social  History Social History   Tobacco Use  . Smoking status: Never Smoker  . Smokeless tobacco: Never Used  Substance Use Topics  . Alcohol use: No  . Drug use: No     Allergies   Aspirin; Fosamax [alendronate sodium]; Levaquin [levofloxacin]; Lipitor [atorvastatin]; Lisinopril; and Reglan [metoclopramide]   Review of Systems Review of Systems  Unable to perform ROS: Dementia     Physical Exam Updated Vital Signs BP (!) 176/94   Pulse 79   Temp (!) 97.4 F (36.3 C) (Oral)   Resp 20   Ht 5\' 5"  (1.651 m)   Wt 68 kg (150 lb)   SpO2 92%   BMI 24.96 kg/m    BP (!) 164/73   Pulse 72   Temp  97.6 F (36.4 C) (Axillary)   Resp 20   Ht 5\' 5"  (1.651 m)   Wt 67.7 kg (149 lb 4 oz)   SpO2 92%   BMI 24.84 kg/m    Physical Exam 1750: Physical examination:  Nursing notes reviewed; Vital signs and O2 SAT reviewed;  Constitutional: Well developed, Well nourished, Well hydrated, In no acute distress; Head:  Normocephalic, atraumatic; Eyes: EOMI, PERRL, No scleral icterus; ENMT: Mouth and pharynx normal, Mucous membranes moist; Neck: Supple, Full range of motion, No lymphadenopathy; Cardiovascular: Irregular rate and rhythm, No gallop; Respiratory: Breath sounds clear & equal bilaterally, No wheezes. Normal respiratory effort/excursion; Chest: Nontender, Movement normal; Abdomen: Soft, Nontender, Nondistended, Normal bowel sounds; Genitourinary: No CVA tenderness; Extremities: Peripheral pulses normal, No tenderness, No edema, No calf edema or asymmetry.; Neuro: Awake, alert, confused per hx dementia. No facial droop. Word searching speech with mild slurring. Not answering questions appropriately or following commands consistently. Grips equal. Strength 4-5/5 equal bilat UE's and LE's. Grips equal..; Skin: Color normal, Warm, Dry.    ED Treatments / Results  Labs (all labs ordered are listed, but only abnormal results are displayed)   EKG EKG Interpretation  Date/Time:  Friday  February 20 2018 17:49:03 EDT Ventricular Rate:  83 PR Interval:    QRS Duration: 133 QT Interval:  409 QTC Calculation: 481 R Axis:   96 Text Interpretation:  Atrial fibrillation RBBB and LPFB When compared with ECG of 12/09/2017 No significant change was found Confirmed by Francine Graven [phone]) on 02/20/2018 6:04:02 PM   Radiology   Procedures Procedures (including critical care time)  Medications Ordered in ED Medications  LORazepam (ATIVAN) injection 1 mg (has no administration in time range)  iopamidol (ISOVUE-370) 76 % injection 100 mL (100 mLs Intravenous Contrast Given 02/20/18 1805)  ondansetron (ZOFRAN) injection 4 mg (4 mg Intravenous Given 02/20/18 1922)      Initial Impression / Assessment and Plan / ED Course  I have reviewed the triage vital signs and the nursing notes.  Pertinent labs & imaging results that were available during my care of the patient were reviewed by me and considered in my medical decision making (see chart for details).  MDM Reviewed: previous chart, nursing note and vitals Reviewed previous: labs and ECG Interpretation: labs, ECG, x-ray, CT scan and MRI Total time providing critical care: 30-74 minutes. This excludes time spent performing separately reportable procedures and services. Consults: neurology and admitting MD    CRITICAL CARE Performed by: Alfonzo Feller Total critical care time: 35 minutes Critical care time was exclusive of separately billable procedures and treating other patients. Critical care was necessary to treat or prevent imminent or life-threatening deterioration. Critical care was time spent personally by me on the following activities: development of treatment plan with patient and/or surrogate as well as nursing, discussions with consultants, evaluation of patient's response to treatment, examination of patient, obtaining history from patient or surrogate, ordering and performing treatments and interventions,  ordering and review of laboratory studies, ordering and review of radiographic studies, pulse oximetry and re-evaluation of patient's condition.  Results for orders placed or performed during the hospital encounter of 02/20/18  Ethanol  Result Value Ref Range   Alcohol, Ethyl (B) <10 <10 mg/dL  Protime-INR  Result Value Ref Range   Prothrombin Time 20.1 (H) 11.4 - 15.2 seconds   INR 1.73   APTT  Result Value Ref Range   aPTT 37 (H) 24 - 36 seconds  CBC  Result Value Ref  Range   WBC 8.5 4.0 - 10.5 K/uL   RBC 4.93 3.87 - 5.11 MIL/uL   Hemoglobin 12.4 12.0 - 15.0 g/dL   HCT 39.3 36.0 - 46.0 %   MCV 79.7 78.0 - 100.0 fL   MCH 25.2 (L) 26.0 - 34.0 pg   MCHC 31.6 30.0 - 36.0 g/dL   RDW 17.2 (H) 11.5 - 15.5 %   Platelets 266 150 - 400 K/uL  Differential  Result Value Ref Range   Neutrophils Relative % 53 %   Neutro Abs 4.5 1.7 - 7.7 K/uL   Lymphocytes Relative 33 %   Lymphs Abs 2.8 0.7 - 4.0 K/uL   Monocytes Relative 11 %   Monocytes Absolute 0.9 0.1 - 1.0 K/uL   Eosinophils Relative 2 %   Eosinophils Absolute 0.2 0.0 - 0.7 K/uL   Basophils Relative 1 %   Basophils Absolute 0.0 0.0 - 0.1 K/uL  Comprehensive metabolic panel  Result Value Ref Range   Sodium 139 135 - 145 mmol/L   Potassium 4.3 3.5 - 5.1 mmol/L   Chloride 101 101 - 111 mmol/L   CO2 23 22 - 32 mmol/L   Glucose, Bld 152 (H) 65 - 99 mg/dL   BUN 25 (H) 6 - 20 mg/dL   Creatinine, Ser 1.28 (H) 0.44 - 1.00 mg/dL   Calcium 10.0 8.9 - 10.3 mg/dL   Total Protein 7.3 6.5 - 8.1 g/dL   Albumin 3.8 3.5 - 5.0 g/dL   AST 27 15 - 41 U/L   ALT 12 (L) 14 - 54 U/L   Alkaline Phosphatase 48 38 - 126 U/L   Total Bilirubin 0.4 0.3 - 1.2 mg/dL   GFR calc non Af Amer 38 (L) >60 mL/min   GFR calc Af Amer 44 (L) >60 mL/min   Anion gap 15 5 - 15  Urine rapid drug screen (hosp performed)not at [facility]  Result Value Ref Range   Opiates POSITIVE (A) NONE DETECTED   Cocaine NONE DETECTED NONE DETECTED   Benzodiazepines NONE DETECTED  NONE DETECTED   Amphetamines NONE DETECTED NONE DETECTED   Tetrahydrocannabinol NONE DETECTED NONE DETECTED   Barbiturates NONE DETECTED NONE DETECTED  Urinalysis, Routine w reflex microscopic  Result Value Ref Range   Color, Urine STRAW (A) YELLOW   APPearance CLEAR CLEAR   Specific Gravity, Urine 1.040 (H) 1.005 - 1.030   pH 5.0 5.0 - 8.0   Glucose, UA 150 (A) NEGATIVE mg/dL   Hgb urine dipstick SMALL (A) NEGATIVE   Bilirubin Urine NEGATIVE NEGATIVE   Ketones, ur 5 (A) NEGATIVE mg/dL   Protein, ur 100 (A) NEGATIVE mg/dL   Nitrite NEGATIVE NEGATIVE   Leukocytes, UA NEGATIVE NEGATIVE   RBC / HPF 0-5 0 - 5 RBC/hpf   WBC, UA 0-5 0 - 5 WBC/hpf   Bacteria, UA NONE SEEN NONE SEEN   Squamous Epithelial / LPF NONE SEEN NONE SEEN   Mucus PRESENT   I-Stat Chem 8, ED  (not at [facility], [facility])  Result Value Ref Range   Sodium 139 135 - 145 mmol/L   Potassium 4.3 3.5 - 5.1 mmol/L   Chloride 100 (L) 101 - 111 mmol/L   BUN 23 (H) 6 - 20 mg/dL   Creatinine, Ser 1.20 (H) 0.44 - 1.00 mg/dL   Glucose, Bld 147 (H) 65 - 99 mg/dL   Calcium, Ion 1.18 1.15 - 1.40 mmol/L   TCO2 25 22 - 32 mmol/L   Hemoglobin 13.3 12.0 - 15.0 g/dL  HCT 39.0 36.0 - 46.0 %  I-stat troponin, ED (not at [facility], [facility])  Result Value Ref Range   Troponin i, poc 0.01 0.00 - 0.08 ng/mL   Comment 3           Ct Angio Head W Or Wo Contrast Result Date: 02/20/2018 CLINICAL DATA:  New onset expressive aphasia and confusion. EXAM: CT ANGIOGRAPHY HEAD AND NECK CT PERFUSION BRAIN TECHNIQUE: Multidetector CT imaging of the head and neck was performed using the standard protocol during bolus administration of intravenous contrast. Multiplanar CT image reconstructions and MIPs were obtained to evaluate the vascular anatomy. Carotid stenosis measurements (when applicable) are obtained utilizing NASCET criteria, using the distal internal carotid diameter as the denominator. Multiphase CT imaging of the brain was performed following IV bolus  contrast injection. Subsequent parametric perfusion maps were calculated using RAPID software. CONTRAST:  155mL ISOVUE-370 IOPAMIDOL (ISOVUE-370) INJECTION 76% COMPARISON:  CT head without contrast 02/20/2018. MRI brain 12/26/2017. FINDINGS: CTA NECK FINDINGS Aortic arch: A 3 vessel arch configuration is present. Atherosclerotic calcifications are present without significant stenosis or aneurysm. Right carotid system: The right common carotid artery is within normal limits. Atherosclerotic changes are present at the bifurcation without a significant stenosis relative to the more distal vessel. Left carotid system: The left common carotid artery is within normal limits. Minimal mural calcification is present distally and at the left carotid bifurcation without a significant stenosis relative to the more distal vessel. Vertebral arteries: Vertebral arteries originate from the subclavian arteries bilaterally without a significant stenosis. The vertebral arteries are codominant. There is no significant stenosis of either vertebral artery in the neck. Skeleton: Multilevel endplate degenerative changes are present. Osseous foraminal narrowing is present on the right at C3-4 and bilaterally at C4-5. Left foraminal narrowing is present at C5-6. Other neck: No significant cervical adenopathy is present. Salivary glands are within normal limits. Thyroid is mildly heterogeneous without a dominant lesion. Upper chest: Multiple paratracheal lymph nodes are present. The largest node is a pretracheal node measuring 17 mm. No focal lung nodule or mass is present. Mild dependent atelectasis is evident. Coronary artery calcifications are noted. Review of the MIP images confirms the above findings CTA HEAD FINDINGS Anterior circulation: Atherosclerotic calcifications are present in the cavernous internal carotid arteries bilaterally without stenosis through the ICA termini. The A1 and M1 segments are normal. Anterior communicating  artery is patent. Moderate left M2 segment stenosis is present. MCA bifurcations are intact. No significant proximal branch vessel stenosis or occlusion is present. Posterior circulation: The right vertebral artery demonstrates moderate stenosis at the dural margin proximal to the PICA. The left vertebral artery becomes dominant above the dura. Basilar artery is normal. Posterior cerebral arteries originate from basilar tip. PCA branch vessels are within normal limits. Venous sinuses: Dural sinuses are patent. The right transverse sinus is dominant. Cortical veins are within normal limits. Anatomic variants: None Delayed phase: The postcontrast images demonstrate no pathologic enhancement. Review of the MIP images confirms the above findings CT Brain Perfusion Findings: CBF (<30%) Volume: 25mL Perfusion (Tmax>6.0s) volume: 7mL is reported by the rapid software. This is felt to be artifactual due to patient angulation and movement. Mismatch Volume: Not applicable Infarction Location:Not applicable IMPRESSION: 1. Artifactual reporting of delayed T-max. No significant ischemic tissue is present. 2. No large vessel occlusion. 3. Atherosclerotic changes at the aortic arch, carotid bifurcations, and cavernous internal carotid arteries without significant stenoses relative to the more distal vessel. 4. Moderate left A2 segment stenosis. 5.  No significant proximal stenosis, aneurysm, or branch vessel occlusion within the middle cerebral arteries bilaterally. 6. Mild narrowing of the right vertebral artery at the dural margin with normal caliber of the left vertebral artery. These results were called by telephone at the time of interpretation on 02/20/2018 at 6:30 pm to Dr. Francine Graven , who verbally acknowledged these results. Electronically Signed   By: San Morelle M.D.   On: 02/20/2018 18:42   Dg Chest 1 View Result Date: 02/20/2018 CLINICAL DATA:  Possible stroke. History of asthma and hypertension. EXAM:  CHEST  1 VIEW COMPARISON:  12/09/2017 FINDINGS: Lungs are adequately inflated with no focal lobar consolidation or effusion. There is prominence of the perihilar markings suggesting mild vascular congestion/edema. Mild cardiomegaly. Calcified plaque over the aortic arch. Mild degenerate change of the spine. IMPRESSION: Mild cardiomegaly with minimal vascular congestion. Electronically Signed   By: Marin Olp M.D.   On: 02/20/2018 18:56   Ct Angio Neck W Or Wo Contrast Result Date: 02/20/2018 CLINICAL DATA:  New onset expressive aphasia and confusion. EXAM: CT ANGIOGRAPHY HEAD AND NECK CT PERFUSION BRAIN TECHNIQUE: Multidetector CT imaging of the head and neck was performed using the standard protocol during bolus administration of intravenous contrast. Multiplanar CT image reconstructions and MIPs were obtained to evaluate the vascular anatomy. Carotid stenosis measurements (when applicable) are obtained utilizing NASCET criteria, using the distal internal carotid diameter as the denominator. Multiphase CT imaging of the brain was performed following IV bolus contrast injection. Subsequent parametric perfusion maps were calculated using RAPID software. CONTRAST:  180mL ISOVUE-370 IOPAMIDOL (ISOVUE-370) INJECTION 76% COMPARISON:  CT head without contrast 02/20/2018. MRI brain 12/26/2017. FINDINGS: CTA NECK FINDINGS Aortic arch: A 3 vessel arch configuration is present. Atherosclerotic calcifications are present without significant stenosis or aneurysm. Right carotid system: The right common carotid artery is within normal limits. Atherosclerotic changes are present at the bifurcation without a significant stenosis relative to the more distal vessel. Left carotid system: The left common carotid artery is within normal limits. Minimal mural calcification is present distally and at the left carotid bifurcation without a significant stenosis relative to the more distal vessel. Vertebral arteries: Vertebral arteries  originate from the subclavian arteries bilaterally without a significant stenosis. The vertebral arteries are codominant. There is no significant stenosis of either vertebral artery in the neck. Skeleton: Multilevel endplate degenerative changes are present. Osseous foraminal narrowing is present on the right at C3-4 and bilaterally at C4-5. Left foraminal narrowing is present at C5-6. Other neck: No significant cervical adenopathy is present. Salivary glands are within normal limits. Thyroid is mildly heterogeneous without a dominant lesion. Upper chest: Multiple paratracheal lymph nodes are present. The largest node is a pretracheal node measuring 17 mm. No focal lung nodule or mass is present. Mild dependent atelectasis is evident. Coronary artery calcifications are noted. Review of the MIP images confirms the above findings CTA HEAD FINDINGS Anterior circulation: Atherosclerotic calcifications are present in the cavernous internal carotid arteries bilaterally without stenosis through the ICA termini. The A1 and M1 segments are normal. Anterior communicating artery is patent. Moderate left M2 segment stenosis is present. MCA bifurcations are intact. No significant proximal branch vessel stenosis or occlusion is present. Posterior circulation: The right vertebral artery demonstrates moderate stenosis at the dural margin proximal to the PICA. The left vertebral artery becomes dominant above the dura. Basilar artery is normal. Posterior cerebral arteries originate from basilar tip. PCA branch vessels are within normal limits. Venous sinuses: Dural sinuses are  patent. The right transverse sinus is dominant. Cortical veins are within normal limits. Anatomic variants: None Delayed phase: The postcontrast images demonstrate no pathologic enhancement. Review of the MIP images confirms the above findings CT Brain Perfusion Findings: CBF (<30%) Volume: 62mL Perfusion (Tmax>6.0s) volume: 76mL is reported by the rapid  software. This is felt to be artifactual due to patient angulation and movement. Mismatch Volume: Not applicable Infarction Location:Not applicable IMPRESSION: 1. Artifactual reporting of delayed T-max. No significant ischemic tissue is present. 2. No large vessel occlusion. 3. Atherosclerotic changes at the aortic arch, carotid bifurcations, and cavernous internal carotid arteries without significant stenoses relative to the more distal vessel. 4. Moderate left A2 segment stenosis. 5. No significant proximal stenosis, aneurysm, or branch vessel occlusion within the middle cerebral arteries bilaterally. 6. Mild narrowing of the right vertebral artery at the dural margin with normal caliber of the left vertebral artery. These results were called by telephone at the time of interpretation on 02/20/2018 at 6:30 pm to Dr. Francine Graven , who verbally acknowledged these results. Electronically Signed   By: San Morelle M.D.   On: 02/20/2018 18:42   Ct Cerebral Perfusion W Contrast  Result Date: 02/20/2018 CLINICAL DATA:  New onset expressive aphasia and confusion. EXAM: CT ANGIOGRAPHY HEAD AND NECK CT PERFUSION BRAIN TECHNIQUE: Multidetector CT imaging of the head and neck was performed using the standard protocol during bolus administration of intravenous contrast. Multiplanar CT image reconstructions and MIPs were obtained to evaluate the vascular anatomy. Carotid stenosis measurements (when applicable) are obtained utilizing NASCET criteria, using the distal internal carotid diameter as the denominator. Multiphase CT imaging of the brain was performed following IV bolus contrast injection. Subsequent parametric perfusion maps were calculated using RAPID software. CONTRAST:  169mL ISOVUE-370 IOPAMIDOL (ISOVUE-370) INJECTION 76% COMPARISON:  CT head without contrast 02/20/2018. MRI brain 12/26/2017. FINDINGS: CTA NECK FINDINGS Aortic arch: A 3 vessel arch configuration is present. Atherosclerotic  calcifications are present without significant stenosis or aneurysm. Right carotid system: The right common carotid artery is within normal limits. Atherosclerotic changes are present at the bifurcation without a significant stenosis relative to the more distal vessel. Left carotid system: The left common carotid artery is within normal limits. Minimal mural calcification is present distally and at the left carotid bifurcation without a significant stenosis relative to the more distal vessel. Vertebral arteries: Vertebral arteries originate from the subclavian arteries bilaterally without a significant stenosis. The vertebral arteries are codominant. There is no significant stenosis of either vertebral artery in the neck. Skeleton: Multilevel endplate degenerative changes are present. Osseous foraminal narrowing is present on the right at C3-4 and bilaterally at C4-5. Left foraminal narrowing is present at C5-6. Other neck: No significant cervical adenopathy is present. Salivary glands are within normal limits. Thyroid is mildly heterogeneous without a dominant lesion. Upper chest: Multiple paratracheal lymph nodes are present. The largest node is a pretracheal node measuring 17 mm. No focal lung nodule or mass is present. Mild dependent atelectasis is evident. Coronary artery calcifications are noted. Review of the MIP images confirms the above findings CTA HEAD FINDINGS Anterior circulation: Atherosclerotic calcifications are present in the cavernous internal carotid arteries bilaterally without stenosis through the ICA termini. The A1 and M1 segments are normal. Anterior communicating artery is patent. Moderate left M2 segment stenosis is present. MCA bifurcations are intact. No significant proximal branch vessel stenosis or occlusion is present. Posterior circulation: The right vertebral artery demonstrates moderate stenosis at the dural margin proximal to the  PICA. The left vertebral artery becomes dominant  above the dura. Basilar artery is normal. Posterior cerebral arteries originate from basilar tip. PCA branch vessels are within normal limits. Venous sinuses: Dural sinuses are patent. The right transverse sinus is dominant. Cortical veins are within normal limits. Anatomic variants: None Delayed phase: The postcontrast images demonstrate no pathologic enhancement. Review of the MIP images confirms the above findings CT Brain Perfusion Findings: CBF (<30%) Volume: 73mL Perfusion (Tmax>6.0s) volume: 50mL is reported by the rapid software. This is felt to be artifactual due to patient angulation and movement. Mismatch Volume: Not applicable Infarction Location:Not applicable IMPRESSION: 1. Artifactual reporting of delayed T-max. No significant ischemic tissue is present. 2. No large vessel occlusion. 3. Atherosclerotic changes at the aortic arch, carotid bifurcations, and cavernous internal carotid arteries without significant stenoses relative to the more distal vessel. 4. Moderate left A2 segment stenosis. 5. No significant proximal stenosis, aneurysm, or branch vessel occlusion within the middle cerebral arteries bilaterally. 6. Mild narrowing of the right vertebral artery at the dural margin with normal caliber of the left vertebral artery. These results were called by telephone at the time of interpretation on 02/20/2018 at 6:30 pm to Dr. Francine Graven , who verbally acknowledged these results. Electronically Signed   By: San Morelle M.D.   On: 02/20/2018 18:42    Ct Head Code Stroke Wo Contrast Result Date: 02/20/2018 CLINICAL DATA:  Code stroke.  Expressive aphasia and confusion EXAM: CT HEAD WITHOUT CONTRAST TECHNIQUE: Contiguous axial images were obtained from the base of the skull through the vertex without intravenous contrast. COMPARISON:  Brain MRI 12/26/2017 FINDINGS: Brain: No mass lesion or acute hemorrhage. No focal hypoattenuation of the basal ganglia or cortex to indicate infarcted  tissue. No hydrocephalus or age advanced atrophy. Vascular: No hyperdense vessel. No advanced atherosclerotic calcification of the arteries at the skull base. Skull: Normal visualized skull base, calvarium and extracranial soft tissues. Sinuses/Orbits: No sinus fluid levels or advanced mucosal thickening. No mastoid effusion. Normal orbits. ASPECTS [facility] Stroke Program Early CT Score) - Ganglionic level infarction (caudate, lentiform nuclei, internal capsule, insula, M1-M3 cortex): 7 - Supraganglionic infarction (M4-M6 cortex): 3 Total score (0-10 with 10 being normal): 10 IMPRESSION: 1. Chronic microvascular ischemia without acute abnormality. 2. ASPECTS is 10. These results were called by telephone at the time of interpretation on 02/20/2018 at 6:04 pm to Dr. Rogene Houston , who verbally acknowledged these results. Electronically Signed   By: Ulyses Jarred M.D.   On: 02/20/2018 18:05    Dg Chest Port 1v Same Day Result Date: 02/20/2018 CLINICAL DATA:  Hypoxia after vomiting EXAM: PORTABLE CHEST 1 VIEW COMPARISON:  02/20/2018, 12/09/2017 FINDINGS: Cardiomegaly with vascular congestion. Minimal ground-glass opacity at the bases. Aortic atherosclerosis. No pneumothorax. IMPRESSION: 1. Cardiomegaly with vascular congestion and mild edema 2. Minimal hazy opacity at the bases, possible atelectasis versus minimal aspiration given history. Electronically Signed   By: Donavan Foil M.D.   On: 02/20/2018 20:07    Mr Brain Wo Contrast (neuro Protocol) Result Date: 02/20/2018 CLINICAL DATA:  Speech difficulty.  Diffusion. EXAM: MRI HEAD WITHOUT CONTRAST TECHNIQUE: Multiplanar, multiecho pulse sequences of the brain and surrounding structures were obtained without intravenous contrast. COMPARISON:  CTA head neck 02/20/2018 FINDINGS: The study is degraded by motion, despite efforts to reduce this artifact, including utilization of motion-resistant MR sequences. The findings of the study are interpreted in the context of  reduced sensitivity/specificity. There is hyperintensity on the diffusion-weighted imaging within the left periatrial white  matter without corresponding ADC abnormality. There is confluent periventricular white matter hyperintensity bilaterally. No midline shift or other mass effect. IMPRESSION: 1. Severely motion degraded examination. 2. Within that limitation, there is DWI hyperintensity within the left periatrial white matter, without corresponding ADC abnormality. This is favored to indicate T2 shine through in the setting of subacute infarction. Electronically Signed   By: Ulyses Jarred M.D.   On: 02/20/2018 20:50     1750:  Code Stroke called on pt's arrival to ED.   1830:  Teleneuro Dr. Dyane Dustman has evaluated pt and ordered CTA head/neck:  Pt not TPA candidate, no LVO intervention; TeleNeuro MD recommends admit (see consult note). Will obtain MRI brain.    1930:  Per ED staff:  pt vomited a large amount of undigested food and c/o nausea; IV zofran given. ED RN states pt's O2 Sats have now dropped to 87% R/A. Will apply O2 N/C and obtain 2nd CXR r/o aspiration. Short neb given.  2140:  Sats improved to 98% on O2 2L N/C after short neb. No fever. Despite IV ativan given for anxiety before MRI, images severely motion degraded but subacute infarction seen. Dx and testing d/w pt's family.  Questions answered.  Verb understanding, agreeable to admit. T/C returned from [facility] Dr. Myna Hidalgo, case discussed, including:  HPI, pertinent PM/SHx, VS/PE, dx testing, ED course and treatment:  Agreeable to admit.       Final Clinical Impressions(s) / ED Diagnoses   Final diagnoses:  None    ED Discharge Orders    None       Francine Graven, DO 02/22/18 1441

## 2018-02-20 NOTE — Consult Note (Addendum)
Date of Service 02/20/2018  [facility] Tele[facility]urology Consult Services  Comments: Last time known well:  _ 16:00 Door time:  _3710 [facility] contacted: _1755 [facility] at bedside: _1800 NIHSS assessment time: _1819 consult end time: _1843 --------------------------------------------------------------------------- Impression: acute confusion and aphasia Consistent with Acute Ischemic Stroke  Does meet Large Vessel Occlusion (LVO) screening criteria (aphasia, neglect, gaze deviation, dense hemiparesis, or visual field deficits on exam), therefore advanced imaging (CTA head and neck and CTP brain) was done.   Differential Diagnosis:  1. Cardioembolic stroke  2. Small vessel disease/ lacune  3. Thromboembolic, artery-to-artery mechanism  4. Hypercoagulable state-related infarct  5. Transient ischemic attack  6. Thrombotic mechanism, large artery disease   ------------------------------------------------------------------------------------------- tPA decision and other recommendations:   _  Patient is not a tPA candidate Head CT did not show any acute hemorrhage. reviewed report (if available) and images  Reason: _ on xarelto.  No  Large Vessel Occlusion on head CTA, not a candidate for mechanical thrombectomy  Recommendations dysphagia screen (General approach to antithrombotic therapy for acute ischemic stroke (suspected or proven) in patient on long-term anticoagulation (appropriately) prior to stroke onset) Will hold anticoagulation and will start aspirin for now (if no contraindication) till follow up head imaging result is available within 24 hours, if follow up head imaging (preferably a brain MRI) shows no acute stroke or small ischemic stroke without any hemorrhage then can stop aspirin and resume anticoagulation in 24-48 hours after stroke symptom onset, if imaging shows large stroke (but no hemorrhagic conversion) then will continue aspirin and keep holding  anticoagulation (Warfarin for at least 10 days and NOAC agents for 14 days (from onset of stroke)). prior to initiating anticoagulation, a noncontrast head CT should be obtained to evaluate for hemorrhagic conversion. once anticoagulation started then aspirin should be stopped.  head of bed flat IV fluids NS Stroke work up with: noncontrast brain MRI, 2D ECHO, lipid panel, HbA1c (Goal LDL<70, HbA1c<7)  inpatient neurology consultation Inpatient stroke evaluation as per Neurology/ Internal Medicine Discussed with ED physician/medical staff   -------------------------------------------------------------------------------------------- Reason for Stroke Alert and History of Present Illness:  _ Patient is an 84 years old female, with history of Diabetes Mellitus, Hyperlipidemia/Hypercholesterolemia, Atrial Fibrillation on Xarelto, previous left MCA stroke.  last known well: 16:00 at about 16:15 was found confused, aphasic, short of breath and hypertensive.  Blood Pressure: 152/72    ---------------------------------------------------------------------------------------------- Review of Systems:  Constitutional:  Negative except as documented in history of present illness.   Eye:  Negative except as documented in history of present illness.   Ear/Nose/Mouth/Throat:  Negative except as documented in history of present illness.   Respiratory:  Negative except as documented in history of present illness.   Cardiovascular:  Negative except as documented in history of present illness.   Gastrointestinal:  Negative except as documented in history of present illness.   Musculoskeletal:  Negative except as documented in history of present illness.   Neurologic:  Negative except as documented in history of present illness.    --------------------------------------------------------------------------------------------------- Examination:   NIHSS Details documented below ___  # NIHSS - NIH  STROKE SCALE - 11 items:  1A: Level of consciousness: 0 - Alert; keenly responsive.  1B: Ask month and age: 84 - Both questions right  1C: 'Blink eyes' & 'squeeze hands': 2 - Performs 0 tasks.:  2: Horizontal extraocular movements: 0 - Normal.  3: Visual fields: 0 - No visual loss.  4: Facial palsy: 1 - Minor paralysis (flat nasolabial fold,  smile asymmetry).  5A: Left arm motor drift: 0 - No drift for 10 seconds, or Amputation/joint fusion.  5B: Right arm motor drift: 0 - No drift for 10 seconds, or Amputation/joint fusion.  6A: Left leg motor drift: 0 - No drift for 5 seconds, or Amputation/joint fusion.  6B: Right leg motor drift: 1 - Drift, but doesn't hit bed.  7: Limb Ataxia: 0 - No ataxia, or does not understand, or Paralyzed, or Amputation/joint fusion.  8: Sensation: 0 - Normal; no sensory loss.  9: Language/aphasia: 2 - Severe aphasia: fragmentary expression, inference needed, cannot identify materials.  10: Dysarthria: 1 - Mild-moderate dysarthria: slurring but can be understood.  11: Extinction/inattention: 0 - No abnormality   == Score: 7. ------------------------------------------------------------------------------------------------------- Medical Decision Making:  - Extensive number of diagnosis or management options are considered above.   - Extensive amount of complex data reviewed.   - High risk of complication and/or morbidity or mortality are associated with differential diagnostic considerations above.  - There may be Uncertain outcome and increased probability of prolonged functional impairment or high probability of severe prolonged functional impairment associated with some of these differential diagnoses.  Medical Data Reviewed:  1.Data reviewed include clinical labs, radiology, Medical Tests;   2.Tests results discussed w/performing or interpreting physician;   3.Obtaining/reviewing old medical records;  4.Obtaining case history from another  source;  5.Independent review of image, tracing or specimen.    When possible Patient/family were informed the Neurology Consult would happen via telehealth (remote video) and consented to receiving care in this manner.    Case discussed with the Medical staff. Critical Care notation:  I was called to see this critical patient emergently. I personally evaluated this critical patient for acute stroke evaluation and determining their eligibility for IV Alteplase and interventional therapies. I have spent approximately 43__ minutes with the patient, including time at bedside, time discussing the case with other physicians, reviewing plan of care, and time independently reviewing the records and scans.

## 2018-02-20 NOTE — H&P (Signed)
History and Physical    Sarah Duarte:[id number] DOB: 02/14/1932 DOA: 02/20/2018  PCP: Glenda Chroman, MD   Patient coming from: SNF   Chief Complaint: Increased confusion, expressive aphasia   HPI: Sarah Duarte is a 84 y.o. female with medical history significant for insulin-dependent diabetes mellitus, hypertension, COPD, chronic kidney disease  stage III, and paroxysmal atrial fibrillation, now presenting from her SNF for evaluation of acute onset expressive aphasia and increased confusion.  Patient was reportedly in her usual state at approximately 4 PM, but noted to have difficulty with her speech and increased confusion excellently 15 minutes later.  She was transported to the ED for evaluation of this.  The patient does not have any complaints.  There was no recent fall or trauma reported.  She has been following with neurology as an outpatient for possible stroke.  ED Course: Upon arrival to the ED, patient is found to be afebrile, saturating adequately on room air, and with vitals otherwise normal.  EKG features atrial fibrillation with RBBB and LP FB.  Chemistry panel is notable for a creatinine of 1.28, consistent with her baseline.  CBC is unremarkable.  Troponin is negative and urinalysis notable for an elevated specific gravity.  Chest x-ray features cardiomegaly and pulmonary vascular congestion.  Noncontrast head CT and CTA head and neck is negative for LVO or significant ischemia.  MRI brain is motion degraded and notable for possible subacute infarct.  Tele-neurologist recommended medical admission with inpatient neurology consultation, hold Xarelto, start aspirin, and further evaluation of suspected stroke.  Patient will be admitted for ongoing evaluation and management of this.  Review of Systems:  All other systems reviewed and apart from HPI, are negative.  Past Medical History:  Diagnosis Date  . Adenomatous polyp    h/o  . Asthma   . Chronic nausea   . COPD  (chronic obstructive pulmonary disease) ([place])   . Diabetes ([place])   . Disorientation, unspecified   . GERD (gastroesophageal reflux disease)   . Hemorrhoids    intermittently,bleeding  . Hyperlipidemia   . Hypertension   . Stroke [facility])     Past Surgical History:  Procedure Laterality Date  . BREAST BIOPSY     right,for benign disease  . COLONOSCOPY  07/30/2002   JJK:[id number] rectum/Transverse left-sided diverticulum/The remainder of the colonic mucosa appeared normal  . COLONOSCOPY N/A 09/14/2014   Procedure: COLONOSCOPY;  Surgeon: Daneil Dolin, MD;  Location: [facility];  Service: Endoscopy;  Laterality: N/A;  8:30 AM - moved to 9:30 - Ginger to notify pt  . TONSILLECTOMY       reports that she has never smoked. She has never used smokeless tobacco. She reports that she does not drink alcohol or use drugs.  Allergies  Allergen Reactions  . Fosamax [Alendronate Sodium]   . Levaquin [Levofloxacin]   . Lipitor [Atorvastatin]   . Lisinopril   . Reglan [Metoclopramide] Other (See Comments)    Panic attacks    Family History  Problem Relation Age of Onset  . COPD Father   . Heart failure Mother   . Cirrhosis Brother        alcohol related cirrhosis  . Lung cancer Brother   . Colon cancer Neg Hx      Prior to Admission medications   Medication Sig Start Date End Date Taking? Authorizing Provider  acetaminophen (TYLENOL) 500 MG tablet Take 1,000 mg by mouth every 6 (six) hours as needed for mild pain  or headache.    [provider]  ALPRAZolam Duanne Moron) 0.25 MG tablet  01/13/18   [provider]  ALPRAZolam Duanne Moron) 0.5 MG tablet Take 1 tablet (0.5 mg total) by mouth 2 (two) times daily as needed for anxiety. 02/19/18   Melvenia Beam, MD  budesonide-formoterol (SYMBICORT) 160-4.5 MCG/ACT inhaler Inhale 2 puffs into the lungs 2 (two) times daily. 02/11/18   Juanito Doom, MD  Calcium Carbonate-Vitamin D (CALTRATE 600+D) 600-400 MG-UNIT per tablet Take  1 tablet by mouth 2 (two) times daily.    [provider]  diltiazem (CARDIZEM CD) 180 MG 24 hr capsule Take 1 capsule (180 mg total) by mouth daily. 01/19/18   Herminio Commons, MD  donepezil (ARICEPT) 10 MG tablet Take 1 tablet (10 mg total) by mouth at bedtime. 01/10/18   Rosalin Hawking, MD  ergocalciferol (VITAMIN D2) 50000 UNITS capsule Take 50,000 Units by mouth every 14 (fourteen) days.     [provider]  glipiZIDE (GLUCOTROL) 5 MG tablet Take 1 tablet (5 mg total) by mouth daily before breakfast. 12/04/17   Tat, Shanon Brow, MD  guaiFENesin-dextromethorphan (ROBITUSSIN DM) 100-10 MG/5ML syrup Take 5 mLs by mouth 2 (two) times daily as needed for cough.    [provider]  HYDROcodone-homatropine (HYCODAN) 5-1.5 MG/5ML syrup Take 5 mLs by mouth every 6 (six) hours as needed for cough.    [provider]  insulin glargine (LANTUS) 100 UNIT/ML injection Inject 12 Units into the skin daily.    [provider]  LANTUS SOLOSTAR 100 UNIT/ML Solostar Pen  02/11/18   [provider]  loratadine (CLARITIN) 10 MG tablet Take 10 mg by mouth daily.    [provider]  metFORMIN (GLUCOPHAGE) 500 MG tablet Take 1,000 mg by mouth 2 (two) times daily with a meal.     [provider]  metoprolol tartrate (LOPRESSOR) 25 MG tablet Take 1 tablet (25 mg total) by mouth 2 (two) times daily. 12/04/17   Orson Eva, MD  Omega-3 1000 MG CAPS Take 1 capsule by mouth daily.     [provider]  ondansetron (ZOFRAN) 4 MG tablet Take 4 mg by mouth every 8 (eight) hours as needed for nausea or vomiting.    [provider]  pantoprazole (PROTONIX) 40 MG tablet Take 40 mg by mouth daily.  07/06/13   [provider]  QUEtiapine (SEROQUEL) 25 MG tablet Take 0.5 tablets (12.5 mg total) by mouth at bedtime. 02/16/18   Rosalin Hawking, MD  ranitidine (ZANTAC) 300 MG tablet Take 300 mg by mouth at bedtime.    [provider]  Rivaroxaban  (XARELTO) 15 MG TABS tablet Take 1 tablet (15 mg total) by mouth daily with supper. 12/04/17   Orson Eva, MD  rosuvastatin (CRESTOR) 5 MG tablet Take 2.5 mg by mouth daily.      [provider]  traZODone (DESYREL) 50 MG tablet  02/09/18   [provider]  valsartan-hydrochlorothiazide (DIOVAN-HCT) 320-12.5 MG tablet Take 1 tablet by mouth daily.    [provider]  VENTOLIN HFA 108 (90 Base) MCG/ACT inhaler Inhale 1 puff into the lungs 3 (three) times daily. 11/27/17   [provider]    Physical Exam: Vitals:   02/20/18 2115 02/20/18 2130 02/20/18 2144 02/20/18 2145  BP: (!) 143/48 136/67  (!) 155/64  Pulse: 84   96  Resp: (!) 33 (!) 25  20  Temp:      TempSrc:  SpO2: 98%  97% 98%  Weight:      Height:          Constitutional: NAD, calm  Eyes: PERTLA, lids and conjunctivae normal ENMT: Mucous membranes are moist. Posterior pharynx clear of any exudate or lesions.   Neck: normal, supple, no masses, no thyromegaly Respiratory: frequent cough, no wheezing, no crackles. Normal respiratory effort.   Cardiovascular: Rate ~80 and irregular. No significant JVD. Abdomen: No distension, no tenderness, no masses palpated. Bowel sounds normal.  Musculoskeletal: no clubbing / cyanosis. No joint deformity upper and lower extremities.    Skin: no significant rashes, lesions, ulcers. Warm, dry, well-perfused. Neurologic: No facial asymmetry. Sensation to light touch intact. Moving all extremities. Expressive aphasia.  Psychiatric: Alert, but not able to answer basic questions. Calm.     Labs on Admission: I have personally reviewed following labs and imaging studies  CBC: Recent Labs  Lab 02/16/18 1656 02/20/18 1752 02/20/18 1821  WBC 9.3 8.5  --   NEUTROABS  --  4.5  --   HGB 12.2 12.4 13.3  HCT 38.6 39.3 39.0  MCV 78* 79.7  --   PLT 261 266  --    Basic Metabolic Panel: Recent Labs  Lab 02/16/18 1656 02/20/18 1752 02/20/18 1821  NA  143 139 139  K 4.6 4.3 4.3  CL 99 101 100*  CO2 23 23  --   GLUCOSE 123* 152* 147*  BUN 20 25* 23*  CREATININE 1.08* 1.28* 1.20*  CALCIUM 10.1 10.0  --    GFR: Estimated Creatinine Clearance: 32 mL/min (A) (by C-G formula based on SCr of 1.2 mg/dL (H)). Liver Function Tests: Recent Labs  Lab 02/20/18 1752  AST 27  ALT 12*  ALKPHOS 48  BILITOT 0.4  PROT 7.3  ALBUMIN 3.8   No results for input(s): LIPASE, AMYLASE in the last 168 hours. No results for input(s): AMMONIA in the last 168 hours. Coagulation Profile: Recent Labs  Lab 02/20/18 1752  INR 1.73   Cardiac Enzymes: No results for input(s): CKTOTAL, CKMB, CKMBINDEX, TROPONINI in the last 168 hours. BNP (last 3 results) No results for input(s): PROBNP in the last 8760 hours. HbA1C: No results for input(s): HGBA1C in the last 72 hours. CBG: No results for input(s): GLUCAP in the last 168 hours. Lipid Profile: No results for input(s): CHOL, HDL, LDLCALC, TRIG, CHOLHDL, LDLDIRECT in the last 72 hours. Thyroid Function Tests: No results for input(s): TSH, T4TOTAL, FREET4, T3FREE, THYROIDAB in the last 72 hours. Anemia Panel: No results for input(s): VITAMINB12, FOLATE, FERRITIN, TIBC, IRON, RETICCTPCT in the last 72 hours. Urine analysis:    Component Value Date/Time   COLORURINE STRAW (A) 02/20/2018 2110   APPEARANCEUR CLEAR 02/20/2018 2110   LABSPEC 1.040 (H) 02/20/2018 2110   PHURINE 5.0 02/20/2018 2110   GLUCOSEU 150 (A) 02/20/2018 2110   HGBUR SMALL (A) 02/20/2018 2110   BILIRUBINUR NEGATIVE 02/20/2018 2110   KETONESUR 5 (A) 02/20/2018 2110   PROTEINUR 100 (A) 02/20/2018 2110   NITRITE NEGATIVE 02/20/2018 2110   LEUKOCYTESUR NEGATIVE 02/20/2018 2110   Sepsis Labs: @LABRCNTIP (procalcitonin:4,lacticidven:4) )No results found for this or any previous visit (from the past 240 hour(s)).   Radiological Exams on Admission: Ct Angio Head W Or Wo Contrast  Result Date: 02/20/2018 CLINICAL DATA:  New onset  expressive aphasia and confusion. EXAM: CT ANGIOGRAPHY HEAD AND NECK CT PERFUSION BRAIN TECHNIQUE: Multidetector CT imaging of the head and neck was performed using the standard protocol during bolus administration  of intravenous contrast. Multiplanar CT image reconstructions and MIPs were obtained to evaluate the vascular anatomy. Carotid stenosis measurements (when applicable) are obtained utilizing NASCET criteria, using the distal internal carotid diameter as the denominator. Multiphase CT imaging of the brain was performed following IV bolus contrast injection. Subsequent parametric perfusion maps were calculated using RAPID software. CONTRAST:  176mL ISOVUE-370 IOPAMIDOL (ISOVUE-370) INJECTION 76% COMPARISON:  CT head without contrast 02/20/2018. MRI brain 12/26/2017. FINDINGS: CTA NECK FINDINGS Aortic arch: A 3 vessel arch configuration is present. Atherosclerotic calcifications are present without significant stenosis or aneurysm. Right carotid system: The right common carotid artery is within normal limits. Atherosclerotic changes are present at the bifurcation without a significant stenosis relative to the more distal vessel. Left carotid system: The left common carotid artery is within normal limits. Minimal mural calcification is present distally and at the left carotid bifurcation without a significant stenosis relative to the more distal vessel. Vertebral arteries: Vertebral arteries originate from the subclavian arteries bilaterally without a significant stenosis. The vertebral arteries are codominant. There is no significant stenosis of either vertebral artery in the neck. Skeleton: Multilevel endplate degenerative changes are present. Osseous foraminal narrowing is present on the right at C3-4 and bilaterally at C4-5. Left foraminal narrowing is present at C5-6. Other neck: No significant cervical adenopathy is present. Salivary glands are within normal limits. Thyroid is mildly heterogeneous  without a dominant lesion. Upper chest: Multiple paratracheal lymph nodes are present. The largest node is a pretracheal node measuring 17 mm. No focal lung nodule or mass is present. Mild dependent atelectasis is evident. Coronary artery calcifications are noted. Review of the MIP images confirms the above findings CTA HEAD FINDINGS Anterior circulation: Atherosclerotic calcifications are present in the cavernous internal carotid arteries bilaterally without stenosis through the ICA termini. The A1 and M1 segments are normal. Anterior communicating artery is patent. Moderate left M2 segment stenosis is present. MCA bifurcations are intact. No significant proximal branch vessel stenosis or occlusion is present. Posterior circulation: The right vertebral artery demonstrates moderate stenosis at the dural margin proximal to the PICA. The left vertebral artery becomes dominant above the dura. Basilar artery is normal. Posterior cerebral arteries originate from basilar tip. PCA branch vessels are within normal limits. Venous sinuses: Dural sinuses are patent. The right transverse sinus is dominant. Cortical veins are within normal limits. Anatomic variants: None Delayed phase: The postcontrast images demonstrate no pathologic enhancement. Review of the MIP images confirms the above findings CT Brain Perfusion Findings: CBF (<30%) Volume: 41mL Perfusion (Tmax>6.0s) volume: 41mL is reported by the rapid software. This is felt to be artifactual due to patient angulation and movement. Mismatch Volume: Not applicable Infarction Location:Not applicable IMPRESSION: 1. Artifactual reporting of delayed T-max. No significant ischemic tissue is present. 2. No large vessel occlusion. 3. Atherosclerotic changes at the aortic arch, carotid bifurcations, and cavernous internal carotid arteries without significant stenoses relative to the more distal vessel. 4. Moderate left A2 segment stenosis. 5. No significant proximal stenosis,  aneurysm, or branch vessel occlusion within the middle cerebral arteries bilaterally. 6. Mild narrowing of the right vertebral artery at the dural margin with normal caliber of the left vertebral artery. These results were called by telephone at the time of interpretation on 02/20/2018 at 6:30 pm to Dr. Francine Graven , who verbally acknowledged these results. Electronically Signed   By: San Morelle M.D.   On: 02/20/2018 18:42   Dg Chest 1 View  Result Date: 02/20/2018 CLINICAL DATA:  Possible  stroke. History of asthma and hypertension. EXAM: CHEST  1 VIEW COMPARISON:  12/09/2017 FINDINGS: Lungs are adequately inflated with no focal lobar consolidation or effusion. There is prominence of the perihilar markings suggesting mild vascular congestion/edema. Mild cardiomegaly. Calcified plaque over the aortic arch. Mild degenerate change of the spine. IMPRESSION: Mild cardiomegaly with minimal vascular congestion. Electronically Signed   By: Marin Olp M.D.   On: 02/20/2018 18:56   Ct Angio Neck W Or Wo Contrast  Result Date: 02/20/2018 CLINICAL DATA:  New onset expressive aphasia and confusion. EXAM: CT ANGIOGRAPHY HEAD AND NECK CT PERFUSION BRAIN TECHNIQUE: Multidetector CT imaging of the head and neck was performed using the standard protocol during bolus administration of intravenous contrast. Multiplanar CT image reconstructions and MIPs were obtained to evaluate the vascular anatomy. Carotid stenosis measurements (when applicable) are obtained utilizing NASCET criteria, using the distal internal carotid diameter as the denominator. Multiphase CT imaging of the brain was performed following IV bolus contrast injection. Subsequent parametric perfusion maps were calculated using RAPID software. CONTRAST:  1109mL ISOVUE-370 IOPAMIDOL (ISOVUE-370) INJECTION 76% COMPARISON:  CT head without contrast 02/20/2018. MRI brain 12/26/2017. FINDINGS: CTA NECK FINDINGS Aortic arch: A 3 vessel arch configuration  is present. Atherosclerotic calcifications are present without significant stenosis or aneurysm. Right carotid system: The right common carotid artery is within normal limits. Atherosclerotic changes are present at the bifurcation without a significant stenosis relative to the more distal vessel. Left carotid system: The left common carotid artery is within normal limits. Minimal mural calcification is present distally and at the left carotid bifurcation without a significant stenosis relative to the more distal vessel. Vertebral arteries: Vertebral arteries originate from the subclavian arteries bilaterally without a significant stenosis. The vertebral arteries are codominant. There is no significant stenosis of either vertebral artery in the neck. Skeleton: Multilevel endplate degenerative changes are present. Osseous foraminal narrowing is present on the right at C3-4 and bilaterally at C4-5. Left foraminal narrowing is present at C5-6. Other neck: No significant cervical adenopathy is present. Salivary glands are within normal limits. Thyroid is mildly heterogeneous without a dominant lesion. Upper chest: Multiple paratracheal lymph nodes are present. The largest node is a pretracheal node measuring 17 mm. No focal lung nodule or mass is present. Mild dependent atelectasis is evident. Coronary artery calcifications are noted. Review of the MIP images confirms the above findings CTA HEAD FINDINGS Anterior circulation: Atherosclerotic calcifications are present in the cavernous internal carotid arteries bilaterally without stenosis through the ICA termini. The A1 and M1 segments are normal. Anterior communicating artery is patent. Moderate left M2 segment stenosis is present. MCA bifurcations are intact. No significant proximal branch vessel stenosis or occlusion is present. Posterior circulation: The right vertebral artery demonstrates moderate stenosis at the dural margin proximal to the PICA. The left vertebral  artery becomes dominant above the dura. Basilar artery is normal. Posterior cerebral arteries originate from basilar tip. PCA branch vessels are within normal limits. Venous sinuses: Dural sinuses are patent. The right transverse sinus is dominant. Cortical veins are within normal limits. Anatomic variants: None Delayed phase: The postcontrast images demonstrate no pathologic enhancement. Review of the MIP images confirms the above findings CT Brain Perfusion Findings: CBF (<30%) Volume: 35mL Perfusion (Tmax>6.0s) volume: 33mL is reported by the rapid software. This is felt to be artifactual due to patient angulation and movement. Mismatch Volume: Not applicable Infarction Location:Not applicable IMPRESSION: 1. Artifactual reporting of delayed T-max. No significant ischemic tissue is present. 2. No large  vessel occlusion. 3. Atherosclerotic changes at the aortic arch, carotid bifurcations, and cavernous internal carotid arteries without significant stenoses relative to the more distal vessel. 4. Moderate left A2 segment stenosis. 5. No significant proximal stenosis, aneurysm, or branch vessel occlusion within the middle cerebral arteries bilaterally. 6. Mild narrowing of the right vertebral artery at the dural margin with normal caliber of the left vertebral artery. These results were called by telephone at the time of interpretation on 02/20/2018 at 6:30 pm to Dr. Francine Graven , who verbally acknowledged these results. Electronically Signed   By: San Morelle M.D.   On: 02/20/2018 18:42   Mr Brain Wo Contrast (neuro Protocol)  Result Date: 02/20/2018 CLINICAL DATA:  Speech difficulty.  Diffusion. EXAM: MRI HEAD WITHOUT CONTRAST TECHNIQUE: Multiplanar, multiecho pulse sequences of the brain and surrounding structures were obtained without intravenous contrast. COMPARISON:  CTA head neck 02/20/2018 FINDINGS: The study is degraded by motion, despite efforts to reduce this artifact, including utilization  of motion-resistant MR sequences. The findings of the study are interpreted in the context of reduced sensitivity/specificity. There is hyperintensity on the diffusion-weighted imaging within the left periatrial white matter without corresponding ADC abnormality. There is confluent periventricular white matter hyperintensity bilaterally. No midline shift or other mass effect. IMPRESSION: 1. Severely motion degraded examination. 2. Within that limitation, there is DWI hyperintensity within the left periatrial white matter, without corresponding ADC abnormality. This is favored to indicate T2 shine through in the setting of subacute infarction. Electronically Signed   By: Ulyses Jarred M.D.   On: 02/20/2018 20:50   Ct Cerebral Perfusion W Contrast  Result Date: 02/20/2018 CLINICAL DATA:  New onset expressive aphasia and confusion. EXAM: CT ANGIOGRAPHY HEAD AND NECK CT PERFUSION BRAIN TECHNIQUE: Multidetector CT imaging of the head and neck was performed using the standard protocol during bolus administration of intravenous contrast. Multiplanar CT image reconstructions and MIPs were obtained to evaluate the vascular anatomy. Carotid stenosis measurements (when applicable) are obtained utilizing NASCET criteria, using the distal internal carotid diameter as the denominator. Multiphase CT imaging of the brain was performed following IV bolus contrast injection. Subsequent parametric perfusion maps were calculated using RAPID software. CONTRAST:  19mL ISOVUE-370 IOPAMIDOL (ISOVUE-370) INJECTION 76% COMPARISON:  CT head without contrast 02/20/2018. MRI brain 12/26/2017. FINDINGS: CTA NECK FINDINGS Aortic arch: A 3 vessel arch configuration is present. Atherosclerotic calcifications are present without significant stenosis or aneurysm. Right carotid system: The right common carotid artery is within normal limits. Atherosclerotic changes are present at the bifurcation without a significant stenosis relative to the more  distal vessel. Left carotid system: The left common carotid artery is within normal limits. Minimal mural calcification is present distally and at the left carotid bifurcation without a significant stenosis relative to the more distal vessel. Vertebral arteries: Vertebral arteries originate from the subclavian arteries bilaterally without a significant stenosis. The vertebral arteries are codominant. There is no significant stenosis of either vertebral artery in the neck. Skeleton: Multilevel endplate degenerative changes are present. Osseous foraminal narrowing is present on the right at C3-4 and bilaterally at C4-5. Left foraminal narrowing is present at C5-6. Other neck: No significant cervical adenopathy is present. Salivary glands are within normal limits. Thyroid is mildly heterogeneous without a dominant lesion. Upper chest: Multiple paratracheal lymph nodes are present. The largest node is a pretracheal node measuring 17 mm. No focal lung nodule or mass is present. Mild dependent atelectasis is evident. Coronary artery calcifications are noted. Review of the MIP images  confirms the above findings CTA HEAD FINDINGS Anterior circulation: Atherosclerotic calcifications are present in the cavernous internal carotid arteries bilaterally without stenosis through the ICA termini. The A1 and M1 segments are normal. Anterior communicating artery is patent. Moderate left M2 segment stenosis is present. MCA bifurcations are intact. No significant proximal branch vessel stenosis or occlusion is present. Posterior circulation: The right vertebral artery demonstrates moderate stenosis at the dural margin proximal to the PICA. The left vertebral artery becomes dominant above the dura. Basilar artery is normal. Posterior cerebral arteries originate from basilar tip. PCA branch vessels are within normal limits. Venous sinuses: Dural sinuses are patent. The right transverse sinus is dominant. Cortical veins are within normal  limits. Anatomic variants: None Delayed phase: The postcontrast images demonstrate no pathologic enhancement. Review of the MIP images confirms the above findings CT Brain Perfusion Findings: CBF (<30%) Volume: 26mL Perfusion (Tmax>6.0s) volume: 18mL is reported by the rapid software. This is felt to be artifactual due to patient angulation and movement. Mismatch Volume: Not applicable Infarction Location:Not applicable IMPRESSION: 1. Artifactual reporting of delayed T-max. No significant ischemic tissue is present. 2. No large vessel occlusion. 3. Atherosclerotic changes at the aortic arch, carotid bifurcations, and cavernous internal carotid arteries without significant stenoses relative to the more distal vessel. 4. Moderate left A2 segment stenosis. 5. No significant proximal stenosis, aneurysm, or branch vessel occlusion within the middle cerebral arteries bilaterally. 6. Mild narrowing of the right vertebral artery at the dural margin with normal caliber of the left vertebral artery. These results were called by telephone at the time of interpretation on 02/20/2018 at 6:30 pm to Dr. Francine Graven , who verbally acknowledged these results. Electronically Signed   By: San Morelle M.D.   On: 02/20/2018 18:42   Dg Chest Port 1v Same Day  Result Date: 02/20/2018 CLINICAL DATA:  Hypoxia after vomiting EXAM: PORTABLE CHEST 1 VIEW COMPARISON:  02/20/2018, 12/09/2017 FINDINGS: Cardiomegaly with vascular congestion. Minimal ground-glass opacity at the bases. Aortic atherosclerosis. No pneumothorax. IMPRESSION: 1. Cardiomegaly with vascular congestion and mild edema 2. Minimal hazy opacity at the bases, possible atelectasis versus minimal aspiration given history. Electronically Signed   By: Donavan Foil M.D.   On: 02/20/2018 20:07   Ct Head Code Stroke Wo Contrast  Result Date: 02/20/2018 CLINICAL DATA:  Code stroke.  Expressive aphasia and confusion EXAM: CT HEAD WITHOUT CONTRAST TECHNIQUE:  Contiguous axial images were obtained from the base of the skull through the vertex without intravenous contrast. COMPARISON:  Brain MRI 12/26/2017 FINDINGS: Brain: No mass lesion or acute hemorrhage. No focal hypoattenuation of the basal ganglia or cortex to indicate infarcted tissue. No hydrocephalus or age advanced atrophy. Vascular: No hyperdense vessel. No advanced atherosclerotic calcification of the arteries at the skull base. Skull: Normal visualized skull base, calvarium and extracranial soft tissues. Sinuses/Orbits: No sinus fluid levels or advanced mucosal thickening. No mastoid effusion. Normal orbits. ASPECTS [facility] Stroke Program Early CT Score) - Ganglionic level infarction (caudate, lentiform nuclei, internal capsule, insula, M1-M3 cortex): 7 - Supraganglionic infarction (M4-M6 cortex): 3 Total score (0-10 with 10 being normal): 10 IMPRESSION: 1. Chronic microvascular ischemia without acute abnormality. 2. ASPECTS is 10. These results were called by telephone at the time of interpretation on 02/20/2018 at 6:04 pm to Dr. Rogene Houston , who verbally acknowledged these results. Electronically Signed   By: Ulyses Jarred M.D.   On: 02/20/2018 18:05    EKG: Independently reviewed. Atrial fibrillation, RBBB, LPFB.   Assessment/Plan  1. Suspected  stroke  - Presents from SNF with acute-onset expressive aphasia and increased confusion  - No LVO on CTA; MRI motion-degraded and notable for possible subacute CVA  - Consult with neurology, continue frequent neuro checks, PT/OT/SLP evals  - Continue cardiac monitoring, check echocardiogram, fasting lipids, A1c  - Hold Xarelto per tele-neurology recs, continue statin, start ASA    2. Atrial fibrillation  - In rate-controlled a fib on admission  - CHADS-VASc is 25 (age x2, CVA x2, HTN, DM)  - Hold Xarelto per tele-neurology recs, continue diltiazem for rate-control    3. CKD stage III  - SCr is 1.28 on admission, consistent with apparent baseline  -  Renally-dose medications, avoid nephrotoxins   4. Insulin-dependent DM  - No recent A1c available  - Managed at SNF with Lantus 12 units qHS and metformin  - Check CBG's, continue Lantus with 8 units qHS and SSI with Novolog while in hospital    5. Hypertension  - BP at goal  - Hold valsartan-HCTZ and Lopressor while evaluating for possible acute CVA    6. COPD  - Stable on admission  - Continue ICS/LABA and prn nebs   7. Anxiety  - Continue Seroquel, prn Xanax   8. Dementia  - Continue Aricept    DVT prophylaxis: Lovenox Code Status: Full  Family Communication: Son updated at bedside Consults called: Neurology Admission status: Observation     Vianne Bulls, MD [facility] Pager [phone]  If 7PM-7AM, please contact night-coverage [email or web address] Password [facility]  02/20/2018, 11:22 PM

## 2018-02-20 NOTE — ED Triage Notes (Signed)
Pt arrived from the [facility] for increased confusion and expressive aphasia starting at 1640.  Pt has baseline confusion.  Dr. Thurnell Garbe at bedside evaluating pt for code stroke.

## 2018-02-20 NOTE — ED Notes (Signed)
Pt remains in Ct with primary RN.  Discussed initial assessment findings with stroke MD and RN.  Spoke with son on phone who is POA and is on the way to the hospital.  Will continue to update him until his arrival.

## 2018-02-20 NOTE — Progress Notes (Signed)
Code Stroke   7322 Call Time  ---- beeped 1751 Exam Started  0254 Exam finished  2706 Images sent 2376 Exam Completed  2831 GR called

## 2018-02-20 NOTE — ED Notes (Signed)
Pt is not TPA candidate and awaiting official report for CTA.  Will continue to monitor.

## 2018-02-21 ENCOUNTER — Observation Stay (HOSPITAL_BASED_OUTPATIENT_CLINIC_OR_DEPARTMENT_OTHER): Payer: Medicare Other

## 2018-02-21 DIAGNOSIS — I639 Cerebral infarction, unspecified: Principal | ICD-10-CM

## 2018-02-21 DIAGNOSIS — I351 Nonrheumatic aortic (valve) insufficiency: Secondary | ICD-10-CM | POA: Diagnosis not present

## 2018-02-21 DIAGNOSIS — I6300 Cerebral infarction due to thrombosis of unspecified precerebral artery: Secondary | ICD-10-CM

## 2018-02-21 DIAGNOSIS — I63012 Cerebral infarction due to thrombosis of left vertebral artery: Secondary | ICD-10-CM | POA: Diagnosis not present

## 2018-02-21 DIAGNOSIS — R569 Unspecified convulsions: Secondary | ICD-10-CM | POA: Diagnosis not present

## 2018-02-21 DIAGNOSIS — I63019 Cerebral infarction due to thrombosis of unspecified vertebral artery: Secondary | ICD-10-CM | POA: Diagnosis not present

## 2018-02-21 LAB — ECHOCARDIOGRAM COMPLETE
Height: 65 in
WEIGHTICAEL: 2400 [oz_av]

## 2018-02-21 LAB — GLUCOSE, CAPILLARY
GLUCOSE-CAPILLARY: 172 mg/dL — AB (ref 65–99)
GLUCOSE-CAPILLARY: 145 mg/dL — AB (ref 65–99)

## 2018-02-21 LAB — LIPID PANEL
CHOL/HDL RATIO: 3.5 ratio
Cholesterol: 129 mg/dL (ref 0–200)
HDL: 37 mg/dL — ABNORMAL LOW (ref >40)
LDL CALC: 74 mg/dL (ref 0–99)
Triglycerides: 88 mg/dL (ref <150)
VLDL: 18 mg/dL (ref 0–40)

## 2018-02-21 LAB — HEMOGLOBIN A1C
Hgb A1c MFr Bld: 6.5 % — ABNORMAL HIGH (ref 4.8–5.6)
Mean Plasma Glucose: 139.85 mg/dL

## 2018-02-21 LAB — CBG MONITORING, ED
Glucose-Capillary: 105 mg/dL — ABNORMAL HIGH (ref 65–99)
Glucose-Capillary: 236 mg/dL — ABNORMAL HIGH (ref 65–99)

## 2018-02-21 LAB — MRSA PCR SCREENING: MRSA BY PCR: NEGATIVE

## 2018-02-21 MED ORDER — INSULIN GLARGINE 100 UNIT/ML ~~LOC~~ SOLN
10.0000 [IU] | Freq: Every day | SUBCUTANEOUS | Status: DC
  Administered 2018-02-21 – 2018-02-23 (×3): 10 [IU] via SUBCUTANEOUS
  Filled 2018-02-21 (×5): qty 0.1

## 2018-02-21 MED ORDER — RIVAROXABAN 15 MG PO TABS
15.0000 mg | ORAL_TABLET | Freq: Every day | ORAL | Status: DC
  Administered 2018-02-21 – 2018-02-23 (×3): 15 mg via ORAL
  Filled 2018-02-21 (×3): qty 1

## 2018-02-21 NOTE — Progress Notes (Signed)
PROGRESS NOTE    Sarah Duarte  UQJ:[id number] DOB: Jan 27, 1932 DOA: 02/20/2018 PCP: Glenda Chroman, MD     Brief Narrative:  84 year old woman admitted from assisted living facility on 3/22 due to increased confusion and expressive aphasia.  She has a history of hypertension, insulin-dependent diabetes, stage III chronic kidney disease and paroxysmal atrial fibrillation who is chronically anticoagulated on Xarelto.  She was sent to the emergency department from her ALF when she started having confusion and speech difficulty at about 4 PM.  Subsequent MRI has confirmed CVA and admission was requested.   Assessment & Plan:   Principal Problem:   Stroke [facility]) Active Problems:   Diabetes mellitus ([place])   Essential hypertension   Paroxysmal atrial fibrillation (HCC)   COPD (chronic obstructive pulmonary disease) (HCC)   Anxiety   CKD (chronic kidney disease), stage III (HCC)   Acute/subacute infarction in the left white matter -Seen by tele-neurology, decision was made to not give TPA as she is chronically on Xarelto. -Continue aspirin  -Echo, Dopplers are pending -PT and speech evaluations are pending as well. -Lipids pending.  Atrial fibrillation -Rate controlled, currently in sinus rhythm. -We will resume Xarelto as per recommendations by tele neurology.  Stage III chronic kidney disease -Creatinine remains at baseline of around 1.2-1.4  Insulin-dependent diabetes -CBGs are elevated. -We will increase Lantus from 8-10 units.  Hypertension -BP slightly elevated in the 389-373 range systolic, antihypertensive agents have been held in face of acute CVA in order to allow for permissive hypertension, consider restarting meds in 48 hours if stable.  Dementia -Stable, no behavioral disturbances. -Continue Aricept, Seroquel  COPD -Stable, continue as needed nebs and long-acting bronchodilator as well as inhaled corticosteroids.   -   DVT prophylaxis: On Xarelto Code  Status: Full code Family Communication: Son and daughter-in-law at bedside updated on plan of care and all questions answered Disposition Plan: Back to ALF versus SNF pending completion of workup and medical stability.  We will request palliative care consultation.  Consultants:   None  Procedures:   As above  Antimicrobials:  Anti-infectives (From admission, onward)   None       Subjective: Lying in bed, not very conversant, can nod or shake her head but that is the extent of her communication  Objective: Vitals:   02/21/18 1500 02/21/18 1530 02/21/18 1545 02/21/18 1600  BP: (!) 176/76 (!) 133/47 (!) 143/55 (!) 151/57  Pulse: (!) 101 87 89 90  Resp: 20 (!) 22 (!) 24 (!) 24  Temp:      TempSrc:      SpO2: 100% 98% 98% 98%  Weight:      Height:        Intake/Output Summary (Last 24 hours) at 02/21/2018 1812 Last data filed at 02/21/2018 1028 Gross per 24 hour  Intake 10 ml  Output -  Net 10 ml   Filed Weights   02/20/18 1748  Weight: 68 kg (150 lb)    Examination:  General exam: Difficult to assess orientation as she is nonverbal and demented Respiratory system: Clear to auscultation. Respiratory effort normal. Cardiovascular system:RRR. No murmurs, rubs, gallops. Gastrointestinal system: Abdomen is nondistended, soft and nontender. No organomegaly or masses felt. Normal bowel sounds heard. Central nervous system: Difficult to assess given nonverbal state and severe dementia at baseline she is unable to follow commands Extremities: No C/C/E, +pedal pulses Skin: No rashes, lesions or ulcers Psychiatry: Unable to assess given nonverbal state    Data Reviewed:  I have personally reviewed following labs and imaging studies  CBC: Recent Labs  Lab 02/16/18 1656 02/20/18 1752 02/20/18 1821  WBC 9.3 8.5  --   NEUTROABS  --  4.5  --   HGB 12.2 12.4 13.3  HCT 38.6 39.3 39.0  MCV 78* 79.7  --   PLT 261 266  --    Basic Metabolic Panel: Recent Labs  Lab  02/16/18 1656 02/20/18 1752 02/20/18 1821  NA 143 139 139  K 4.6 4.3 4.3  CL 99 101 100*  CO2 23 23  --   GLUCOSE 123* 152* 147*  BUN 20 25* 23*  CREATININE 1.08* 1.28* 1.20*  CALCIUM 10.1 10.0  --    GFR: Estimated Creatinine Clearance: 32 mL/min (A) (by C-G formula based on SCr of 1.2 mg/dL (H)). Liver Function Tests: Recent Labs  Lab 02/20/18 1752  AST 27  ALT 12*  ALKPHOS 48  BILITOT 0.4  PROT 7.3  ALBUMIN 3.8   No results for input(s): LIPASE, AMYLASE in the last 168 hours. No results for input(s): AMMONIA in the last 168 hours. Coagulation Profile: Recent Labs  Lab 02/20/18 1752  INR 1.73   Cardiac Enzymes: No results for input(s): CKTOTAL, CKMB, CKMBINDEX, TROPONINI in the last 168 hours. BNP (last 3 results) No results for input(s): PROBNP in the last 8760 hours. HbA1C: Recent Labs    02/21/18 0557  HGBA1C 6.5*   CBG: Recent Labs  Lab 02/21/18 0056 02/21/18 0733 02/21/18 1719  GLUCAP 236* 105* 172*   Lipid Profile: Recent Labs    02/21/18 0557  CHOL 129  HDL 37*  LDLCALC 74  TRIG 88  CHOLHDL 3.5   Thyroid Function Tests: No results for input(s): TSH, T4TOTAL, FREET4, T3FREE, THYROIDAB in the last 72 hours. Anemia Panel: No results for input(s): VITAMINB12, FOLATE, FERRITIN, TIBC, IRON, RETICCTPCT in the last 72 hours. Urine analysis:    Component Value Date/Time   COLORURINE STRAW (A) 02/20/2018 2110   APPEARANCEUR CLEAR 02/20/2018 2110   LABSPEC 1.040 (H) 02/20/2018 2110   PHURINE 5.0 02/20/2018 2110   GLUCOSEU 150 (A) 02/20/2018 2110   HGBUR SMALL (A) 02/20/2018 2110   BILIRUBINUR NEGATIVE 02/20/2018 2110   KETONESUR 5 (A) 02/20/2018 2110   PROTEINUR 100 (A) 02/20/2018 2110   NITRITE NEGATIVE 02/20/2018 2110   LEUKOCYTESUR NEGATIVE 02/20/2018 2110   Sepsis Labs: @LABRCNTIP (procalcitonin:4,lacticidven:4)  ) Recent Results (from the past 240 hour(s))  MRSA PCR Screening     Status: None   Collection Time: 02/21/18 12:23 PM   Result Value Ref Range Status   MRSA by PCR NEGATIVE NEGATIVE Final    Comment:        The GeneXpert MRSA Assay (FDA approved for NASAL specimens only), is one component of a comprehensive MRSA colonization surveillance program. It is not intended to diagnose MRSA infection nor to guide or monitor treatment for MRSA infections. Performed at [facility], [address] [place],  [address]          Radiology Studies: Ct Angio Head W Or Wo Contrast  Result Date: 02/20/2018 CLINICAL DATA:  New onset expressive aphasia and confusion. EXAM: CT ANGIOGRAPHY HEAD AND NECK CT PERFUSION BRAIN TECHNIQUE: Multidetector CT imaging of the head and neck was performed using the standard protocol during bolus administration of intravenous contrast. Multiplanar CT image reconstructions and MIPs were obtained to evaluate the vascular anatomy. Carotid stenosis measurements (when applicable) are obtained utilizing NASCET criteria, using the distal internal carotid diameter as the  denominator. Multiphase CT imaging of the brain was performed following IV bolus contrast injection. Subsequent parametric perfusion maps were calculated using RAPID software. CONTRAST:  171mL ISOVUE-370 IOPAMIDOL (ISOVUE-370) INJECTION 76% COMPARISON:  CT head without contrast 02/20/2018. MRI brain 12/26/2017. FINDINGS: CTA NECK FINDINGS Aortic arch: A 3 vessel arch configuration is present. Atherosclerotic calcifications are present without significant stenosis or aneurysm. Right carotid system: The right common carotid artery is within normal limits. Atherosclerotic changes are present at the bifurcation without a significant stenosis relative to the more distal vessel. Left carotid system: The left common carotid artery is within normal limits. Minimal mural calcification is present distally and at the left carotid bifurcation without a significant stenosis relative to the more distal vessel. Vertebral arteries: Vertebral  arteries originate from the subclavian arteries bilaterally without a significant stenosis. The vertebral arteries are codominant. There is no significant stenosis of either vertebral artery in the neck. Skeleton: Multilevel endplate degenerative changes are present. Osseous foraminal narrowing is present on the right at C3-4 and bilaterally at C4-5. Left foraminal narrowing is present at C5-6. Other neck: No significant cervical adenopathy is present. Salivary glands are within normal limits. Thyroid is mildly heterogeneous without a dominant lesion. Upper chest: Multiple paratracheal lymph nodes are present. The largest node is a pretracheal node measuring 17 mm. No focal lung nodule or mass is present. Mild dependent atelectasis is evident. Coronary artery calcifications are noted. Review of the MIP images confirms the above findings CTA HEAD FINDINGS Anterior circulation: Atherosclerotic calcifications are present in the cavernous internal carotid arteries bilaterally without stenosis through the ICA termini. The A1 and M1 segments are normal. Anterior communicating artery is patent. Moderate left M2 segment stenosis is present. MCA bifurcations are intact. No significant proximal branch vessel stenosis or occlusion is present. Posterior circulation: The right vertebral artery demonstrates moderate stenosis at the dural margin proximal to the PICA. The left vertebral artery becomes dominant above the dura. Basilar artery is normal. Posterior cerebral arteries originate from basilar tip. PCA branch vessels are within normal limits. Venous sinuses: Dural sinuses are patent. The right transverse sinus is dominant. Cortical veins are within normal limits. Anatomic variants: None Delayed phase: The postcontrast images demonstrate no pathologic enhancement. Review of the MIP images confirms the above findings CT Brain Perfusion Findings: CBF (<30%) Volume: 75mL Perfusion (Tmax>6.0s) volume: 55mL is reported by the  rapid software. This is felt to be artifactual due to patient angulation and movement. Mismatch Volume: Not applicable Infarction Location:Not applicable IMPRESSION: 1. Artifactual reporting of delayed T-max. No significant ischemic tissue is present. 2. No large vessel occlusion. 3. Atherosclerotic changes at the aortic arch, carotid bifurcations, and cavernous internal carotid arteries without significant stenoses relative to the more distal vessel. 4. Moderate left A2 segment stenosis. 5. No significant proximal stenosis, aneurysm, or branch vessel occlusion within the middle cerebral arteries bilaterally. 6. Mild narrowing of the right vertebral artery at the dural margin with normal caliber of the left vertebral artery. These results were called by telephone at the time of interpretation on 02/20/2018 at 6:30 pm to Dr. Francine Graven , who verbally acknowledged these results. Electronically Signed   By: San Morelle M.D.   On: 02/20/2018 18:42   Dg Chest 1 View  Result Date: 02/20/2018 CLINICAL DATA:  Possible stroke. History of asthma and hypertension. EXAM: CHEST  1 VIEW COMPARISON:  12/09/2017 FINDINGS: Lungs are adequately inflated with no focal lobar consolidation or effusion. There is prominence of the perihilar markings suggesting  mild vascular congestion/edema. Mild cardiomegaly. Calcified plaque over the aortic arch. Mild degenerate change of the spine. IMPRESSION: Mild cardiomegaly with minimal vascular congestion. Electronically Signed   By: Marin Olp M.D.   On: 02/20/2018 18:56   Ct Angio Neck W Or Wo Contrast  Result Date: 02/20/2018 CLINICAL DATA:  New onset expressive aphasia and confusion. EXAM: CT ANGIOGRAPHY HEAD AND NECK CT PERFUSION BRAIN TECHNIQUE: Multidetector CT imaging of the head and neck was performed using the standard protocol during bolus administration of intravenous contrast. Multiplanar CT image reconstructions and MIPs were obtained to evaluate the vascular  anatomy. Carotid stenosis measurements (when applicable) are obtained utilizing NASCET criteria, using the distal internal carotid diameter as the denominator. Multiphase CT imaging of the brain was performed following IV bolus contrast injection. Subsequent parametric perfusion maps were calculated using RAPID software. CONTRAST:  115mL ISOVUE-370 IOPAMIDOL (ISOVUE-370) INJECTION 76% COMPARISON:  CT head without contrast 02/20/2018. MRI brain 12/26/2017. FINDINGS: CTA NECK FINDINGS Aortic arch: A 3 vessel arch configuration is present. Atherosclerotic calcifications are present without significant stenosis or aneurysm. Right carotid system: The right common carotid artery is within normal limits. Atherosclerotic changes are present at the bifurcation without a significant stenosis relative to the more distal vessel. Left carotid system: The left common carotid artery is within normal limits. Minimal mural calcification is present distally and at the left carotid bifurcation without a significant stenosis relative to the more distal vessel. Vertebral arteries: Vertebral arteries originate from the subclavian arteries bilaterally without a significant stenosis. The vertebral arteries are codominant. There is no significant stenosis of either vertebral artery in the neck. Skeleton: Multilevel endplate degenerative changes are present. Osseous foraminal narrowing is present on the right at C3-4 and bilaterally at C4-5. Left foraminal narrowing is present at C5-6. Other neck: No significant cervical adenopathy is present. Salivary glands are within normal limits. Thyroid is mildly heterogeneous without a dominant lesion. Upper chest: Multiple paratracheal lymph nodes are present. The largest node is a pretracheal node measuring 17 mm. No focal lung nodule or mass is present. Mild dependent atelectasis is evident. Coronary artery calcifications are noted. Review of the MIP images confirms the above findings CTA HEAD  FINDINGS Anterior circulation: Atherosclerotic calcifications are present in the cavernous internal carotid arteries bilaterally without stenosis through the ICA termini. The A1 and M1 segments are normal. Anterior communicating artery is patent. Moderate left M2 segment stenosis is present. MCA bifurcations are intact. No significant proximal branch vessel stenosis or occlusion is present. Posterior circulation: The right vertebral artery demonstrates moderate stenosis at the dural margin proximal to the PICA. The left vertebral artery becomes dominant above the dura. Basilar artery is normal. Posterior cerebral arteries originate from basilar tip. PCA branch vessels are within normal limits. Venous sinuses: Dural sinuses are patent. The right transverse sinus is dominant. Cortical veins are within normal limits. Anatomic variants: None Delayed phase: The postcontrast images demonstrate no pathologic enhancement. Review of the MIP images confirms the above findings CT Brain Perfusion Findings: CBF (<30%) Volume: 33mL Perfusion (Tmax>6.0s) volume: 42mL is reported by the rapid software. This is felt to be artifactual due to patient angulation and movement. Mismatch Volume: Not applicable Infarction Location:Not applicable IMPRESSION: 1. Artifactual reporting of delayed T-max. No significant ischemic tissue is present. 2. No large vessel occlusion. 3. Atherosclerotic changes at the aortic arch, carotid bifurcations, and cavernous internal carotid arteries without significant stenoses relative to the more distal vessel. 4. Moderate left A2 segment stenosis. 5. No significant  proximal stenosis, aneurysm, or branch vessel occlusion within the middle cerebral arteries bilaterally. 6. Mild narrowing of the right vertebral artery at the dural margin with normal caliber of the left vertebral artery. These results were called by telephone at the time of interpretation on 02/20/2018 at 6:30 pm to Dr. Francine Graven , who  verbally acknowledged these results. Electronically Signed   By: San Morelle M.D.   On: 02/20/2018 18:42   Mr Brain Wo Contrast (neuro Protocol)  Result Date: 02/20/2018 CLINICAL DATA:  Speech difficulty.  Diffusion. EXAM: MRI HEAD WITHOUT CONTRAST TECHNIQUE: Multiplanar, multiecho pulse sequences of the brain and surrounding structures were obtained without intravenous contrast. COMPARISON:  CTA head neck 02/20/2018 FINDINGS: The study is degraded by motion, despite efforts to reduce this artifact, including utilization of motion-resistant MR sequences. The findings of the study are interpreted in the context of reduced sensitivity/specificity. There is hyperintensity on the diffusion-weighted imaging within the left periatrial white matter without corresponding ADC abnormality. There is confluent periventricular white matter hyperintensity bilaterally. No midline shift or other mass effect. IMPRESSION: 1. Severely motion degraded examination. 2. Within that limitation, there is DWI hyperintensity within the left periatrial white matter, without corresponding ADC abnormality. This is favored to indicate T2 shine through in the setting of subacute infarction. Electronically Signed   By: Ulyses Jarred M.D.   On: 02/20/2018 20:50   Ct Cerebral Perfusion W Contrast  Result Date: 02/20/2018 CLINICAL DATA:  New onset expressive aphasia and confusion. EXAM: CT ANGIOGRAPHY HEAD AND NECK CT PERFUSION BRAIN TECHNIQUE: Multidetector CT imaging of the head and neck was performed using the standard protocol during bolus administration of intravenous contrast. Multiplanar CT image reconstructions and MIPs were obtained to evaluate the vascular anatomy. Carotid stenosis measurements (when applicable) are obtained utilizing NASCET criteria, using the distal internal carotid diameter as the denominator. Multiphase CT imaging of the brain was performed following IV bolus contrast injection. Subsequent parametric  perfusion maps were calculated using RAPID software. CONTRAST:  165mL ISOVUE-370 IOPAMIDOL (ISOVUE-370) INJECTION 76% COMPARISON:  CT head without contrast 02/20/2018. MRI brain 12/26/2017. FINDINGS: CTA NECK FINDINGS Aortic arch: A 3 vessel arch configuration is present. Atherosclerotic calcifications are present without significant stenosis or aneurysm. Right carotid system: The right common carotid artery is within normal limits. Atherosclerotic changes are present at the bifurcation without a significant stenosis relative to the more distal vessel. Left carotid system: The left common carotid artery is within normal limits. Minimal mural calcification is present distally and at the left carotid bifurcation without a significant stenosis relative to the more distal vessel. Vertebral arteries: Vertebral arteries originate from the subclavian arteries bilaterally without a significant stenosis. The vertebral arteries are codominant. There is no significant stenosis of either vertebral artery in the neck. Skeleton: Multilevel endplate degenerative changes are present. Osseous foraminal narrowing is present on the right at C3-4 and bilaterally at C4-5. Left foraminal narrowing is present at C5-6. Other neck: No significant cervical adenopathy is present. Salivary glands are within normal limits. Thyroid is mildly heterogeneous without a dominant lesion. Upper chest: Multiple paratracheal lymph nodes are present. The largest node is a pretracheal node measuring 17 mm. No focal lung nodule or mass is present. Mild dependent atelectasis is evident. Coronary artery calcifications are noted. Review of the MIP images confirms the above findings CTA HEAD FINDINGS Anterior circulation: Atherosclerotic calcifications are present in the cavernous internal carotid arteries bilaterally without stenosis through the ICA termini. The A1 and M1 segments are normal. Anterior  communicating artery is patent. Moderate left M2 segment  stenosis is present. MCA bifurcations are intact. No significant proximal branch vessel stenosis or occlusion is present. Posterior circulation: The right vertebral artery demonstrates moderate stenosis at the dural margin proximal to the PICA. The left vertebral artery becomes dominant above the dura. Basilar artery is normal. Posterior cerebral arteries originate from basilar tip. PCA branch vessels are within normal limits. Venous sinuses: Dural sinuses are patent. The right transverse sinus is dominant. Cortical veins are within normal limits. Anatomic variants: None Delayed phase: The postcontrast images demonstrate no pathologic enhancement. Review of the MIP images confirms the above findings CT Brain Perfusion Findings: CBF (<30%) Volume: 22mL Perfusion (Tmax>6.0s) volume: 67mL is reported by the rapid software. This is felt to be artifactual due to patient angulation and movement. Mismatch Volume: Not applicable Infarction Location:Not applicable IMPRESSION: 1. Artifactual reporting of delayed T-max. No significant ischemic tissue is present. 2. No large vessel occlusion. 3. Atherosclerotic changes at the aortic arch, carotid bifurcations, and cavernous internal carotid arteries without significant stenoses relative to the more distal vessel. 4. Moderate left A2 segment stenosis. 5. No significant proximal stenosis, aneurysm, or branch vessel occlusion within the middle cerebral arteries bilaterally. 6. Mild narrowing of the right vertebral artery at the dural margin with normal caliber of the left vertebral artery. These results were called by telephone at the time of interpretation on 02/20/2018 at 6:30 pm to Dr. Francine Graven , who verbally acknowledged these results. Electronically Signed   By: San Morelle M.D.   On: 02/20/2018 18:42   Dg Chest Port 1v Same Day  Result Date: 02/20/2018 CLINICAL DATA:  Hypoxia after vomiting EXAM: PORTABLE CHEST 1 VIEW COMPARISON:  02/20/2018, 12/09/2017  FINDINGS: Cardiomegaly with vascular congestion. Minimal ground-glass opacity at the bases. Aortic atherosclerosis. No pneumothorax. IMPRESSION: 1. Cardiomegaly with vascular congestion and mild edema 2. Minimal hazy opacity at the bases, possible atelectasis versus minimal aspiration given history. Electronically Signed   By: Donavan Foil M.D.   On: 02/20/2018 20:07   Ct Head Code Stroke Wo Contrast  Result Date: 02/20/2018 CLINICAL DATA:  Code stroke.  Expressive aphasia and confusion EXAM: CT HEAD WITHOUT CONTRAST TECHNIQUE: Contiguous axial images were obtained from the base of the skull through the vertex without intravenous contrast. COMPARISON:  Brain MRI 12/26/2017 FINDINGS: Brain: No mass lesion or acute hemorrhage. No focal hypoattenuation of the basal ganglia or cortex to indicate infarcted tissue. No hydrocephalus or age advanced atrophy. Vascular: No hyperdense vessel. No advanced atherosclerotic calcification of the arteries at the skull base. Skull: Normal visualized skull base, calvarium and extracranial soft tissues. Sinuses/Orbits: No sinus fluid levels or advanced mucosal thickening. No mastoid effusion. Normal orbits. ASPECTS [facility] Stroke Program Early CT Score) - Ganglionic level infarction (caudate, lentiform nuclei, internal capsule, insula, M1-M3 cortex): 7 - Supraganglionic infarction (M4-M6 cortex): 3 Total score (0-10 with 10 being normal): 10 IMPRESSION: 1. Chronic microvascular ischemia without acute abnormality. 2. ASPECTS is 10. These results were called by telephone at the time of interpretation on 02/20/2018 at 6:04 pm to Dr. Rogene Houston , who verbally acknowledged these results. Electronically Signed   By: Ulyses Jarred M.D.   On: 02/20/2018 18:05        Scheduled Meds: .  stroke: mapping our early stages of recovery book   Does not apply Once  . aspirin  300 mg Rectal Daily   Or  . aspirin  325 mg Oral Daily  . calcium-vitamin D  1  tablet Oral BID  . diltiazem   180 mg Oral Daily  . donepezil  10 mg Oral QHS  . enoxaparin (LOVENOX) injection  40 mg Subcutaneous Q24H  . famotidine  20 mg Oral QHS  . insulin aspart  0-5 Units Subcutaneous QHS  . insulin aspart  0-9 Units Subcutaneous TID WC  . insulin glargine  8 Units Subcutaneous QHS  . loratadine  10 mg Oral Daily  . mometasone-formoterol  2 puff Inhalation BID  . [START ON 02/22/2018] omega-3 acid ethyl esters  1,000 mg Oral Daily  . pantoprazole  40 mg Oral Daily  . QUEtiapine  12.5 mg Oral QHS  . rosuvastatin  2.5 mg Oral q1800  . sodium chloride flush  3 mL Intravenous Q12H  . traZODone  50 mg Oral QHS   Continuous Infusions: . sodium chloride       LOS: 0 days    Time spent: 25 minutes. Greater than 50% of this time was spent in direct contact with the patient coordinating care.     Lelon Frohlich, MD [facility] Pager [phone]  If 7PM-7AM, please contact night-coverage [email or web address] Password [facility] 02/21/2018, 6:12 PM

## 2018-02-21 NOTE — ED Notes (Signed)
This RN entered this pt's room upon visitor's request. Pt was at the end of the bed, iv bleeding, and oxygen off. This RN made the son aware that the pt needed to remain in the bed and that if she tried to get up again, to press the red button on the call bell. IV cleaned up and re taped. This RN and Angelina, NT cleaned pt as she had urinated on herself. Pt also made aware that if she needed something to call the nurses station and not to try to get out of the bed. Pt requesting water at this time. This pt was made aware that the she could not have water per her RN's orders at this time.

## 2018-02-21 NOTE — ED Notes (Signed)
EKG complete and exported

## 2018-02-21 NOTE — ED Notes (Signed)
Call [place] with updates [phone]

## 2018-02-21 NOTE — Progress Notes (Signed)
*  PRELIMINARY RESULTS* Echocardiogram 2D Echocardiogram has been performed.  Sarah Duarte 02/21/2018, 4:40 PM

## 2018-02-21 NOTE — ED Notes (Signed)
Patient was sitting at foot of the bed . Patient had urinated on herself. Family sitting in the room with patient.

## 2018-02-22 ENCOUNTER — Observation Stay (HOSPITAL_COMMUNITY): Payer: Medicare Other

## 2018-02-22 DIAGNOSIS — G40209 Localization-related (focal) (partial) symptomatic epilepsy and epileptic syndromes with complex partial seizures, not intractable, without status epilepticus: Secondary | ICD-10-CM | POA: Diagnosis present

## 2018-02-22 DIAGNOSIS — Z8673 Personal history of transient ischemic attack (TIA), and cerebral infarction without residual deficits: Secondary | ICD-10-CM | POA: Diagnosis not present

## 2018-02-22 DIAGNOSIS — G9349 Other encephalopathy: Secondary | ICD-10-CM | POA: Diagnosis present

## 2018-02-22 DIAGNOSIS — I639 Cerebral infarction, unspecified: Secondary | ICD-10-CM | POA: Diagnosis not present

## 2018-02-22 DIAGNOSIS — R4781 Slurred speech: Secondary | ICD-10-CM | POA: Diagnosis present

## 2018-02-22 DIAGNOSIS — Z888 Allergy status to other drugs, medicaments and biological substances status: Secondary | ICD-10-CM | POA: Diagnosis not present

## 2018-02-22 DIAGNOSIS — R569 Unspecified convulsions: Secondary | ICD-10-CM | POA: Diagnosis not present

## 2018-02-22 DIAGNOSIS — Z794 Long term (current) use of insulin: Secondary | ICD-10-CM | POA: Diagnosis not present

## 2018-02-22 DIAGNOSIS — R4701 Aphasia: Secondary | ICD-10-CM | POA: Diagnosis present

## 2018-02-22 DIAGNOSIS — E1122 Type 2 diabetes mellitus with diabetic chronic kidney disease: Secondary | ICD-10-CM | POA: Diagnosis present

## 2018-02-22 DIAGNOSIS — I480 Paroxysmal atrial fibrillation: Secondary | ICD-10-CM | POA: Diagnosis present

## 2018-02-22 DIAGNOSIS — Z801 Family history of malignant neoplasm of trachea, bronchus and lung: Secondary | ICD-10-CM | POA: Diagnosis not present

## 2018-02-22 DIAGNOSIS — J449 Chronic obstructive pulmonary disease, unspecified: Secondary | ICD-10-CM | POA: Diagnosis present

## 2018-02-22 DIAGNOSIS — F419 Anxiety disorder, unspecified: Secondary | ICD-10-CM | POA: Diagnosis present

## 2018-02-22 DIAGNOSIS — Z8249 Family history of ischemic heart disease and other diseases of the circulatory system: Secondary | ICD-10-CM | POA: Diagnosis not present

## 2018-02-22 DIAGNOSIS — Z515 Encounter for palliative care: Secondary | ICD-10-CM | POA: Diagnosis not present

## 2018-02-22 DIAGNOSIS — N183 Chronic kidney disease, stage 3 (moderate): Secondary | ICD-10-CM | POA: Diagnosis not present

## 2018-02-22 DIAGNOSIS — R29707 NIHSS score 7: Secondary | ICD-10-CM | POA: Diagnosis present

## 2018-02-22 DIAGNOSIS — I63019 Cerebral infarction due to thrombosis of unspecified vertebral artery: Secondary | ICD-10-CM

## 2018-02-22 DIAGNOSIS — F0390 Unspecified dementia without behavioral disturbance: Secondary | ICD-10-CM | POA: Diagnosis present

## 2018-02-22 DIAGNOSIS — Z7901 Long term (current) use of anticoagulants: Secondary | ICD-10-CM | POA: Diagnosis not present

## 2018-02-22 DIAGNOSIS — K219 Gastro-esophageal reflux disease without esophagitis: Secondary | ICD-10-CM | POA: Diagnosis present

## 2018-02-22 DIAGNOSIS — Z7951 Long term (current) use of inhaled steroids: Secondary | ICD-10-CM | POA: Diagnosis not present

## 2018-02-22 DIAGNOSIS — I63012 Cerebral infarction due to thrombosis of left vertebral artery: Secondary | ICD-10-CM | POA: Diagnosis not present

## 2018-02-22 DIAGNOSIS — E785 Hyperlipidemia, unspecified: Secondary | ICD-10-CM | POA: Diagnosis present

## 2018-02-22 DIAGNOSIS — Z825 Family history of asthma and other chronic lower respiratory diseases: Secondary | ICD-10-CM | POA: Diagnosis not present

## 2018-02-22 DIAGNOSIS — Z881 Allergy status to other antibiotic agents status: Secondary | ICD-10-CM | POA: Diagnosis not present

## 2018-02-22 DIAGNOSIS — I129 Hypertensive chronic kidney disease with stage 1 through stage 4 chronic kidney disease, or unspecified chronic kidney disease: Secondary | ICD-10-CM | POA: Diagnosis present

## 2018-02-22 DIAGNOSIS — Z79899 Other long term (current) drug therapy: Secondary | ICD-10-CM | POA: Diagnosis not present

## 2018-02-22 DIAGNOSIS — Z7189 Other specified counseling: Secondary | ICD-10-CM | POA: Diagnosis not present

## 2018-02-22 LAB — GLUCOSE, CAPILLARY
GLUCOSE-CAPILLARY: 138 mg/dL — AB (ref 65–99)
GLUCOSE-CAPILLARY: 117 mg/dL — AB (ref 65–99)
Glucose-Capillary: 85 mg/dL (ref 65–99)
Glucose-Capillary: 190 mg/dL — ABNORMAL HIGH (ref 65–99)

## 2018-02-22 MED ORDER — ORAL CARE MOUTH RINSE
15.0000 mL | Freq: Two times a day (BID) | OROMUCOSAL | Status: DC
  Administered 2018-02-22 – 2018-02-24 (×4): 15 mL via OROMUCOSAL

## 2018-02-22 NOTE — Progress Notes (Signed)
Patient is still mildly confused. Lung sounds appear diminished. No adventitious breath sounds noted.

## 2018-02-22 NOTE — Plan of Care (Signed)
  Problem: Acute Rehab PT Goals(only PT should resolve) Goal: Pt Will Go Supine/Side To Sit Outcome: Progressing Flowsheets (Taken 02/22/2018 1417) Pt will go Supine/Side to Sit: with modified independence Goal: Patient Will Transfer Sit To/From Stand Outcome: Progressing Flowsheets (Taken 02/22/2018 1417) Patient will transfer sit to/from stand: with modified independence Goal: Pt Will Transfer Bed To Chair/Chair To Bed Outcome: Progressing Flowsheets (Taken 02/22/2018 1417) Pt will Transfer Bed to Chair/Chair to Bed: with modified independence Goal: Pt Will Ambulate Outcome: Progressing Flowsheets (Taken 02/22/2018 1417) Pt will Ambulate: 100 feet;with modified independence;with least restrictive assistive device  Clarene Critchley PT, DPT 2:18 PM, 02/22/18 [phone]

## 2018-02-22 NOTE — Progress Notes (Signed)
PROGRESS NOTE    Sarah Duarte  HAL:[id number] DOB: 10-17-1932 DOA: 02/20/2018 PCP: Glenda Chroman, MD     Brief Narrative:  84 year old woman admitted from assisted living facility on 3/22 due to increased confusion and expressive aphasia.  She has a history of hypertension, insulin-dependent diabetes, stage III chronic kidney disease and paroxysmal atrial fibrillation who is chronically anticoagulated on Xarelto.  She was sent to the emergency department from her ALF when she started having confusion and speech difficulty at about 4 PM.  Subsequent MRI has confirmed CVA and admission was requested.   Assessment & Plan:   Principal Problem:   Stroke [facility]) Active Problems:   Diabetes mellitus ([place])   Essential hypertension   Paroxysmal atrial fibrillation (HCC)   COPD (chronic obstructive pulmonary disease) (HCC)   Anxiety   CKD (chronic kidney disease), stage III (HCC)   Acute/subacute infarction in the left white matter -Seen by tele-neurology, decision was made to not give TPA as she is chronically on Xarelto. -Continue aspirin  -Echo: Ejection fraction of 60-65% with normal left ventricular diastolic function parameters, no wall motion abnormalities, no PFO was identified. -Dopplers: Less than 50% bilateral ICA stenosis, antegrade vertebral flow. -PT and speech evaluations are pending as well. -LDL is 70, continue rosuvastatin.  Atrial fibrillation -Rate controlled, currently in sinus rhythm. -We will resume Xarelto as per recommendations by tele neurology.  Stage III chronic kidney disease -Creatinine remains at baseline of around 1.2-1.4  Insulin-dependent diabetes -CBGs are much better controlled with increase Lantus to 10 units ordered on 3/23. -Continue current management.  Hypertension -BP slightly elevated in the 735-329 range systolic, antihypertensive agents have been held in face of acute CVA in order to allow for permissive hypertension, consider restarting  meds in 24 hours if stable.  Dementia -Stable, no behavioral disturbances. -Continue Aricept, Seroquel  COPD -Stable, continue as needed nebs and long-acting bronchodilator as well as inhaled corticosteroids.    DVT prophylaxis: On Xarelto Code Status: Full code Family Communication: Patient only  disposition Plan: Back to ALF versus SNF pending completion of workup and medical stability.  We will request palliative care consultation.  (Suspect will need SNF)  Consultants:   None  Procedures:   As above  Antimicrobials:  Anti-infectives (From admission, onward)   None       Subjective: Lying in bed, complains of headache, still with significant expressive aphasia and unable to answer even simple questions. Objective: Vitals:   02/22/18 0400 02/22/18 0500 02/22/18 0600 02/22/18 0800  BP: (!) 163/57 (!) 158/114 (!) 164/73   Pulse: 78 85 72   Resp: (!) 25 (!) 22 20   Temp: 98.2 F (36.8 C)   97.6 F (36.4 C)  TempSrc: Oral   Axillary  SpO2: 93% 93% 92%   Weight:  67.7 kg (149 lb 4 oz)    Height:       No intake or output data in the 24 hours ending 02/22/18 1543 Filed Weights   02/20/18 1748 02/22/18 0500  Weight: 68 kg (150 lb) 67.7 kg (149 lb 4 oz)    Examination:  General exam: Awake, unable to assess orientation given her aphasia Respiratory system: Clear to auscultation. Respiratory effort normal. Cardiovascular system:RRR. No murmurs, rubs, gallops. Gastrointestinal system: Abdomen is nondistended, soft and nontender. No organomegaly or masses felt. Normal bowel sounds heard. Central nervous system: Appears to move all 4 spontaneously, rest of neurologic exam is limited given confusion and aphasia Extremities: No C/C/E, +pedal pulses  Skin: No rashes, lesions or ulcers Psychiatry: Unable to assess given the aphasia     Data Reviewed: I have personally reviewed following labs and imaging studies  CBC: Recent Labs  Lab 02/16/18 1656  02/20/18 1752 02/20/18 1821  WBC 9.3 8.5  --   NEUTROABS  --  4.5  --   HGB 12.2 12.4 13.3  HCT 38.6 39.3 39.0  MCV 78* 79.7  --   PLT 261 266  --    Basic Metabolic Panel: Recent Labs  Lab 02/16/18 1656 02/20/18 1752 02/20/18 1821  NA 143 139 139  K 4.6 4.3 4.3  CL 99 101 100*  CO2 23 23  --   GLUCOSE 123* 152* 147*  BUN 20 25* 23*  CREATININE 1.08* 1.28* 1.20*  CALCIUM 10.1 10.0  --    GFR: Estimated Creatinine Clearance: 32 mL/min (A) (by C-G formula based on SCr of 1.2 mg/dL (H)). Liver Function Tests: Recent Labs  Lab 02/20/18 1752  AST 27  ALT 12*  ALKPHOS 48  BILITOT 0.4  PROT 7.3  ALBUMIN 3.8   No results for input(s): LIPASE, AMYLASE in the last 168 hours. No results for input(s): AMMONIA in the last 168 hours. Coagulation Profile: Recent Labs  Lab 02/20/18 1752  INR 1.73   Cardiac Enzymes: No results for input(s): CKTOTAL, CKMB, CKMBINDEX, TROPONINI in the last 168 hours. BNP (last 3 results) No results for input(s): PROBNP in the last 8760 hours. HbA1C: Recent Labs    02/21/18 0557  HGBA1C 6.5*   CBG: Recent Labs  Lab 02/21/18 0733 02/21/18 1719 02/21/18 2145 02/22/18 0819 02/22/18 1236  GLUCAP 105* 172* 145* 85 138*   Lipid Profile: Recent Labs    02/21/18 0557  CHOL 129  HDL 37*  LDLCALC 74  TRIG 88  CHOLHDL 3.5   Thyroid Function Tests: No results for input(s): TSH, T4TOTAL, FREET4, T3FREE, THYROIDAB in the last 72 hours. Anemia Panel: No results for input(s): VITAMINB12, FOLATE, FERRITIN, TIBC, IRON, RETICCTPCT in the last 72 hours. Urine analysis:    Component Value Date/Time   COLORURINE STRAW (A) 02/20/2018 2110   APPEARANCEUR CLEAR 02/20/2018 2110   LABSPEC 1.040 (H) 02/20/2018 2110   PHURINE 5.0 02/20/2018 2110   GLUCOSEU 150 (A) 02/20/2018 2110   HGBUR SMALL (A) 02/20/2018 2110   BILIRUBINUR NEGATIVE 02/20/2018 2110   KETONESUR 5 (A) 02/20/2018 2110   PROTEINUR 100 (A) 02/20/2018 2110   NITRITE NEGATIVE  02/20/2018 2110   LEUKOCYTESUR NEGATIVE 02/20/2018 2110   Sepsis Labs: @LABRCNTIP (procalcitonin:4,lacticidven:4)  ) Recent Results (from the past 240 hour(s))  MRSA PCR Screening     Status: None   Collection Time: 02/21/18 12:23 PM  Result Value Ref Range Status   MRSA by PCR NEGATIVE NEGATIVE Final    Comment:        The GeneXpert MRSA Assay (FDA approved for NASAL specimens only), is one component of a comprehensive MRSA colonization surveillance program. It is not intended to diagnose MRSA infection nor to guide or monitor treatment for MRSA infections. Performed at [facility], [address] [place], [place] [address]          Radiology Studies: Ct Angio Head W Or Wo Contrast  Result Date: 02/20/2018 CLINICAL DATA:  New onset expressive aphasia and confusion. EXAM: CT ANGIOGRAPHY HEAD AND NECK CT PERFUSION BRAIN TECHNIQUE: Multidetector CT imaging of the head and neck was performed using the standard protocol during bolus administration of intravenous contrast. Multiplanar CT image reconstructions and MIPs  were obtained to evaluate the vascular anatomy. Carotid stenosis measurements (when applicable) are obtained utilizing NASCET criteria, using the distal internal carotid diameter as the denominator. Multiphase CT imaging of the brain was performed following IV bolus contrast injection. Subsequent parametric perfusion maps were calculated using RAPID software. CONTRAST:  191mL ISOVUE-370 IOPAMIDOL (ISOVUE-370) INJECTION 76% COMPARISON:  CT head without contrast 02/20/2018. MRI brain 12/26/2017. FINDINGS: CTA NECK FINDINGS Aortic arch: A 3 vessel arch configuration is present. Atherosclerotic calcifications are present without significant stenosis or aneurysm. Right carotid system: The right common carotid artery is within normal limits. Atherosclerotic changes are present at the bifurcation without a significant stenosis relative to the more distal vessel. Left carotid  system: The left common carotid artery is within normal limits. Minimal mural calcification is present distally and at the left carotid bifurcation without a significant stenosis relative to the more distal vessel. Vertebral arteries: Vertebral arteries originate from the subclavian arteries bilaterally without a significant stenosis. The vertebral arteries are codominant. There is no significant stenosis of either vertebral artery in the neck. Skeleton: Multilevel endplate degenerative changes are present. Osseous foraminal narrowing is present on the right at C3-4 and bilaterally at C4-5. Left foraminal narrowing is present at C5-6. Other neck: No significant cervical adenopathy is present. Salivary glands are within normal limits. Thyroid is mildly heterogeneous without a dominant lesion. Upper chest: Multiple paratracheal lymph nodes are present. The largest node is a pretracheal node measuring 17 mm. No focal lung nodule or mass is present. Mild dependent atelectasis is evident. Coronary artery calcifications are noted. Review of the MIP images confirms the above findings CTA HEAD FINDINGS Anterior circulation: Atherosclerotic calcifications are present in the cavernous internal carotid arteries bilaterally without stenosis through the ICA termini. The A1 and M1 segments are normal. Anterior communicating artery is patent. Moderate left M2 segment stenosis is present. MCA bifurcations are intact. No significant proximal branch vessel stenosis or occlusion is present. Posterior circulation: The right vertebral artery demonstrates moderate stenosis at the dural margin proximal to the PICA. The left vertebral artery becomes dominant above the dura. Basilar artery is normal. Posterior cerebral arteries originate from basilar tip. PCA branch vessels are within normal limits. Venous sinuses: Dural sinuses are patent. The right transverse sinus is dominant. Cortical veins are within normal limits. Anatomic variants:  None Delayed phase: The postcontrast images demonstrate no pathologic enhancement. Review of the MIP images confirms the above findings CT Brain Perfusion Findings: CBF (<30%) Volume: 67mL Perfusion (Tmax>6.0s) volume: 82mL is reported by the rapid software. This is felt to be artifactual due to patient angulation and movement. Mismatch Volume: Not applicable Infarction Location:Not applicable IMPRESSION: 1. Artifactual reporting of delayed T-max. No significant ischemic tissue is present. 2. No large vessel occlusion. 3. Atherosclerotic changes at the aortic arch, carotid bifurcations, and cavernous internal carotid arteries without significant stenoses relative to the more distal vessel. 4. Moderate left A2 segment stenosis. 5. No significant proximal stenosis, aneurysm, or branch vessel occlusion within the middle cerebral arteries bilaterally. 6. Mild narrowing of the right vertebral artery at the dural margin with normal caliber of the left vertebral artery. These results were called by telephone at the time of interpretation on 02/20/2018 at 6:30 pm to Dr. Francine Graven , who verbally acknowledged these results. Electronically Signed   By: San Morelle M.D.   On: 02/20/2018 18:42   Dg Chest 1 View  Result Date: 02/20/2018 CLINICAL DATA:  Possible stroke. History of asthma and hypertension. EXAM: CHEST  1 VIEW COMPARISON:  12/09/2017 FINDINGS: Lungs are adequately inflated with no focal lobar consolidation or effusion. There is prominence of the perihilar markings suggesting mild vascular congestion/edema. Mild cardiomegaly. Calcified plaque over the aortic arch. Mild degenerate change of the spine. IMPRESSION: Mild cardiomegaly with minimal vascular congestion. Electronically Signed   By: Marin Olp M.D.   On: 02/20/2018 18:56   Ct Angio Neck W Or Wo Contrast  Result Date: 02/20/2018 CLINICAL DATA:  New onset expressive aphasia and confusion. EXAM: CT ANGIOGRAPHY HEAD AND NECK CT PERFUSION  BRAIN TECHNIQUE: Multidetector CT imaging of the head and neck was performed using the standard protocol during bolus administration of intravenous contrast. Multiplanar CT image reconstructions and MIPs were obtained to evaluate the vascular anatomy. Carotid stenosis measurements (when applicable) are obtained utilizing NASCET criteria, using the distal internal carotid diameter as the denominator. Multiphase CT imaging of the brain was performed following IV bolus contrast injection. Subsequent parametric perfusion maps were calculated using RAPID software. CONTRAST:  125mL ISOVUE-370 IOPAMIDOL (ISOVUE-370) INJECTION 76% COMPARISON:  CT head without contrast 02/20/2018. MRI brain 12/26/2017. FINDINGS: CTA NECK FINDINGS Aortic arch: A 3 vessel arch configuration is present. Atherosclerotic calcifications are present without significant stenosis or aneurysm. Right carotid system: The right common carotid artery is within normal limits. Atherosclerotic changes are present at the bifurcation without a significant stenosis relative to the more distal vessel. Left carotid system: The left common carotid artery is within normal limits. Minimal mural calcification is present distally and at the left carotid bifurcation without a significant stenosis relative to the more distal vessel. Vertebral arteries: Vertebral arteries originate from the subclavian arteries bilaterally without a significant stenosis. The vertebral arteries are codominant. There is no significant stenosis of either vertebral artery in the neck. Skeleton: Multilevel endplate degenerative changes are present. Osseous foraminal narrowing is present on the right at C3-4 and bilaterally at C4-5. Left foraminal narrowing is present at C5-6. Other neck: No significant cervical adenopathy is present. Salivary glands are within normal limits. Thyroid is mildly heterogeneous without a dominant lesion. Upper chest: Multiple paratracheal lymph nodes are present.  The largest node is a pretracheal node measuring 17 mm. No focal lung nodule or mass is present. Mild dependent atelectasis is evident. Coronary artery calcifications are noted. Review of the MIP images confirms the above findings CTA HEAD FINDINGS Anterior circulation: Atherosclerotic calcifications are present in the cavernous internal carotid arteries bilaterally without stenosis through the ICA termini. The A1 and M1 segments are normal. Anterior communicating artery is patent. Moderate left M2 segment stenosis is present. MCA bifurcations are intact. No significant proximal branch vessel stenosis or occlusion is present. Posterior circulation: The right vertebral artery demonstrates moderate stenosis at the dural margin proximal to the PICA. The left vertebral artery becomes dominant above the dura. Basilar artery is normal. Posterior cerebral arteries originate from basilar tip. PCA branch vessels are within normal limits. Venous sinuses: Dural sinuses are patent. The right transverse sinus is dominant. Cortical veins are within normal limits. Anatomic variants: None Delayed phase: The postcontrast images demonstrate no pathologic enhancement. Review of the MIP images confirms the above findings CT Brain Perfusion Findings: CBF (<30%) Volume: 54mL Perfusion (Tmax>6.0s) volume: 80mL is reported by the rapid software. This is felt to be artifactual due to patient angulation and movement. Mismatch Volume: Not applicable Infarction Location:Not applicable IMPRESSION: 1. Artifactual reporting of delayed T-max. No significant ischemic tissue is present. 2. No large vessel occlusion. 3. Atherosclerotic changes at the aortic arch,  carotid bifurcations, and cavernous internal carotid arteries without significant stenoses relative to the more distal vessel. 4. Moderate left A2 segment stenosis. 5. No significant proximal stenosis, aneurysm, or branch vessel occlusion within the middle cerebral arteries bilaterally. 6.  Mild narrowing of the right vertebral artery at the dural margin with normal caliber of the left vertebral artery. These results were called by telephone at the time of interpretation on 02/20/2018 at 6:30 pm to Dr. Francine Graven , who verbally acknowledged these results. Electronically Signed   By: San Morelle M.D.   On: 02/20/2018 18:42   Mr Brain Wo Contrast (neuro Protocol)  Result Date: 02/20/2018 CLINICAL DATA:  Speech difficulty.  Diffusion. EXAM: MRI HEAD WITHOUT CONTRAST TECHNIQUE: Multiplanar, multiecho pulse sequences of the brain and surrounding structures were obtained without intravenous contrast. COMPARISON:  CTA head neck 02/20/2018 FINDINGS: The study is degraded by motion, despite efforts to reduce this artifact, including utilization of motion-resistant MR sequences. The findings of the study are interpreted in the context of reduced sensitivity/specificity. There is hyperintensity on the diffusion-weighted imaging within the left periatrial white matter without corresponding ADC abnormality. There is confluent periventricular white matter hyperintensity bilaterally. No midline shift or other mass effect. IMPRESSION: 1. Severely motion degraded examination. 2. Within that limitation, there is DWI hyperintensity within the left periatrial white matter, without corresponding ADC abnormality. This is favored to indicate T2 shine through in the setting of subacute infarction. Electronically Signed   By: Ulyses Jarred M.D.   On: 02/20/2018 20:50   US Carotid Bilateral  Result Date: 02/22/2018 CLINICAL DATA:  Speech difficulty.  Stroke. EXAM: BILATERAL CAROTID DUPLEX ULTRASOUND TECHNIQUE: Pearline Cables scale imaging, color Doppler and duplex ultrasound were performed of bilateral carotid and vertebral arteries in the neck. COMPARISON:  Brain MR 02/20/2018 FINDINGS: Criteria: Quantification of carotid stenosis is based on velocity parameters that correlate the residual internal carotid diameter  with NASCET-based stenosis levels, using the diameter of the distal internal carotid lumen as the denominator for stenosis measurement. The following velocity measurements were obtained: RIGHT ICA:  60 cm/sec CCA:  66 cm/sec SYSTOLIC ICA/CCA RATIO:  1.1 DIASTOLIC ICA/CCA RATIO:  1.8 ECA:  65 cm/sec LEFT ICA:  69 cm/sec CCA:  67 cm/sec SYSTOLIC ICA/CCA RATIO:  1.0 DIASTOLIC ICA/CCA RATIO:  0.9 ECA:  64 cm/sec RIGHT CAROTID ARTERY: Intimal thickening in the internal carotid artery and carotid bulb. External carotid artery is patent with normal waveform. Normal waveforms and velocities in the internal carotid artery. RIGHT VERTEBRAL ARTERY: Antegrade flow and normal waveform in the right vertebral artery. LEFT CAROTID ARTERY: Intimal thickening in the distal common carotid artery. Eccentric echogenic plaque at the left carotid bulb. External carotid artery is patent with normal waveform. Intimal thickening and mild plaque in the proximal internal carotid artery. Normal waveforms and velocities in the internal carotid artery. LEFT VERTEBRAL ARTERY: Antegrade flow and normal waveform in the left vertebral artery. IMPRESSION: Mild atherosclerotic disease in the carotid arteries. Focus of eccentric plaque at the left carotid bulb. Estimated degree of stenosis in the internal carotid arteries is less than 50% bilaterally. Patent vertebral arteries with antegrade flow. Electronically Signed   By: Markus Daft M.D.   On: 02/22/2018 15:34   Ct Cerebral Perfusion W Contrast  Result Date: 02/20/2018 CLINICAL DATA:  New onset expressive aphasia and confusion. EXAM: CT ANGIOGRAPHY HEAD AND NECK CT PERFUSION BRAIN TECHNIQUE: Multidetector CT imaging of the head and neck was performed using the standard protocol during bolus administration of intravenous  contrast. Multiplanar CT image reconstructions and MIPs were obtained to evaluate the vascular anatomy. Carotid stenosis measurements (when applicable) are obtained utilizing  NASCET criteria, using the distal internal carotid diameter as the denominator. Multiphase CT imaging of the brain was performed following IV bolus contrast injection. Subsequent parametric perfusion maps were calculated using RAPID software. CONTRAST:  170mL ISOVUE-370 IOPAMIDOL (ISOVUE-370) INJECTION 76% COMPARISON:  CT head without contrast 02/20/2018. MRI brain 12/26/2017. FINDINGS: CTA NECK FINDINGS Aortic arch: A 3 vessel arch configuration is present. Atherosclerotic calcifications are present without significant stenosis or aneurysm. Right carotid system: The right common carotid artery is within normal limits. Atherosclerotic changes are present at the bifurcation without a significant stenosis relative to the more distal vessel. Left carotid system: The left common carotid artery is within normal limits. Minimal mural calcification is present distally and at the left carotid bifurcation without a significant stenosis relative to the more distal vessel. Vertebral arteries: Vertebral arteries originate from the subclavian arteries bilaterally without a significant stenosis. The vertebral arteries are codominant. There is no significant stenosis of either vertebral artery in the neck. Skeleton: Multilevel endplate degenerative changes are present. Osseous foraminal narrowing is present on the right at C3-4 and bilaterally at C4-5. Left foraminal narrowing is present at C5-6. Other neck: No significant cervical adenopathy is present. Salivary glands are within normal limits. Thyroid is mildly heterogeneous without a dominant lesion. Upper chest: Multiple paratracheal lymph nodes are present. The largest node is a pretracheal node measuring 17 mm. No focal lung nodule or mass is present. Mild dependent atelectasis is evident. Coronary artery calcifications are noted. Review of the MIP images confirms the above findings CTA HEAD FINDINGS Anterior circulation: Atherosclerotic calcifications are present in the  cavernous internal carotid arteries bilaterally without stenosis through the ICA termini. The A1 and M1 segments are normal. Anterior communicating artery is patent. Moderate left M2 segment stenosis is present. MCA bifurcations are intact. No significant proximal branch vessel stenosis or occlusion is present. Posterior circulation: The right vertebral artery demonstrates moderate stenosis at the dural margin proximal to the PICA. The left vertebral artery becomes dominant above the dura. Basilar artery is normal. Posterior cerebral arteries originate from basilar tip. PCA branch vessels are within normal limits. Venous sinuses: Dural sinuses are patent. The right transverse sinus is dominant. Cortical veins are within normal limits. Anatomic variants: None Delayed phase: The postcontrast images demonstrate no pathologic enhancement. Review of the MIP images confirms the above findings CT Brain Perfusion Findings: CBF (<30%) Volume: 81mL Perfusion (Tmax>6.0s) volume: 35mL is reported by the rapid software. This is felt to be artifactual due to patient angulation and movement. Mismatch Volume: Not applicable Infarction Location:Not applicable IMPRESSION: 1. Artifactual reporting of delayed T-max. No significant ischemic tissue is present. 2. No large vessel occlusion. 3. Atherosclerotic changes at the aortic arch, carotid bifurcations, and cavernous internal carotid arteries without significant stenoses relative to the more distal vessel. 4. Moderate left A2 segment stenosis. 5. No significant proximal stenosis, aneurysm, or branch vessel occlusion within the middle cerebral arteries bilaterally. 6. Mild narrowing of the right vertebral artery at the dural margin with normal caliber of the left vertebral artery. These results were called by telephone at the time of interpretation on 02/20/2018 at 6:30 pm to Dr. Francine Graven , who verbally acknowledged these results. Electronically Signed   By: San Morelle  M.D.   On: 02/20/2018 18:42   Dg Chest Port 1v Same Day  Result Date: 02/20/2018 CLINICAL DATA:  Hypoxia after vomiting EXAM: PORTABLE CHEST 1 VIEW COMPARISON:  02/20/2018, 12/09/2017 FINDINGS: Cardiomegaly with vascular congestion. Minimal ground-glass opacity at the bases. Aortic atherosclerosis. No pneumothorax. IMPRESSION: 1. Cardiomegaly with vascular congestion and mild edema 2. Minimal hazy opacity at the bases, possible atelectasis versus minimal aspiration given history. Electronically Signed   By: Donavan Foil M.D.   On: 02/20/2018 20:07   Ct Head Code Stroke Wo Contrast  Result Date: 02/20/2018 CLINICAL DATA:  Code stroke.  Expressive aphasia and confusion EXAM: CT HEAD WITHOUT CONTRAST TECHNIQUE: Contiguous axial images were obtained from the base of the skull through the vertex without intravenous contrast. COMPARISON:  Brain MRI 12/26/2017 FINDINGS: Brain: No mass lesion or acute hemorrhage. No focal hypoattenuation of the basal ganglia or cortex to indicate infarcted tissue. No hydrocephalus or age advanced atrophy. Vascular: No hyperdense vessel. No advanced atherosclerotic calcification of the arteries at the skull base. Skull: Normal visualized skull base, calvarium and extracranial soft tissues. Sinuses/Orbits: No sinus fluid levels or advanced mucosal thickening. No mastoid effusion. Normal orbits. ASPECTS [facility] Stroke Program Early CT Score) - Ganglionic level infarction (caudate, lentiform nuclei, internal capsule, insula, M1-M3 cortex): 7 - Supraganglionic infarction (M4-M6 cortex): 3 Total score (0-10 with 10 being normal): 10 IMPRESSION: 1. Chronic microvascular ischemia without acute abnormality. 2. ASPECTS is 10. These results were called by telephone at the time of interpretation on 02/20/2018 at 6:04 pm to Dr. Rogene Houston , who verbally acknowledged these results. Electronically Signed   By: Ulyses Jarred M.D.   On: 02/20/2018 18:05        Scheduled Meds: .  stroke:  mapping our early stages of recovery book   Does not apply Once  . aspirin  300 mg Rectal Daily   Or  . aspirin  325 mg Oral Daily  . calcium-vitamin D  1 tablet Oral BID  . diltiazem  180 mg Oral Daily  . donepezil  10 mg Oral QHS  . famotidine  20 mg Oral QHS  . insulin aspart  0-5 Units Subcutaneous QHS  . insulin aspart  0-9 Units Subcutaneous TID WC  . insulin glargine  10 Units Subcutaneous QHS  . loratadine  10 mg Oral Daily  . mometasone-formoterol  2 puff Inhalation BID  . omega-3 acid ethyl esters  1,000 mg Oral Daily  . pantoprazole  40 mg Oral Daily  . QUEtiapine  12.5 mg Oral QHS  . rivaroxaban  15 mg Oral Daily  . rosuvastatin  2.5 mg Oral q1800  . sodium chloride flush  3 mL Intravenous Q12H  . traZODone  50 mg Oral QHS   Continuous Infusions: . sodium chloride       LOS: 0 days    Time spent: 25 minutes. Greater than 50% of this time was spent in direct contact with the patient coordinating care.     Lelon Frohlich, MD [facility] Pager [phone]  If 7PM-7AM, please contact night-coverage [email or web address] Password [facility] 02/22/2018, 3:43 PM

## 2018-02-22 NOTE — Evaluation (Signed)
Physical Therapy Evaluation Patient Details Name: LAINE FONNER MRN: [id number] DOB: 11/25/1932 Today's Date: 02/22/2018   History of Present Illness  NEVAYAH FAUST is a 84 y.o. female with medical history significant for insulin-dependent diabetes mellitus, hypertension, COPD, chronic kidney disease  stage III, and paroxysmal atrial fibrillation, now presenting from her SNF for evaluation of acute onset expressive aphasia and increased confusion.  Patient was reportedly in her usual state at approximately 4 PM, but noted to have difficulty with her speech and increased confusion excellently 15 minutes later.  She was transported to the ED for evaluation of this.  The patient does not have any complaints.  There was no recent fall or trauma reported.  She has been following with neurology as an outpatient for possible stroke.   Clinical Impression    Patient was unable to provide any information on her previous living situation or equipment upon questioning during evaluation due to her cognitive status. Nursing staff stated it was okay to work with patient and helped remove Purewick before therapy. Patient performed bed mobility with supervision, patient performed sit to stand with supervision and with minimal gaurding without assistive device. Patient ambulated 10 feet using rolling walker and then another additional 65 feet without assistive device and with supervision, and minimal guarding. Patient was unable to follow commands or demonstration during manual muscle testing or with coordination testing and patient became agitated stating therapist was calling her "crazy". Overall patient demonstrated impulsivity and demonstrated lack of safety awareness. Patient would benefit from skilled physical therapy in order to improve strength, transfers, bed mobility, endurance, ambulation, and overall functional mobility. Patient would benefit from continued therapy through skilled nursing facility to a  continue addressing functional mobility upon discharge.    Follow Up Recommendations SNF    Equipment Recommendations       Recommendations for Other Services       Precautions / Restrictions Precautions Precautions: Fall Restrictions Weight Bearing Restrictions: No      Mobility  Bed Mobility Overal bed mobility: Needs Assistance Bed Mobility: Supine to Sit     Supine to sit: Supervision     General bed mobility comments: Patient performed bed mobility with supervision and some increased time  Transfers Overall transfer level: Needs assistance   Transfers: Sit to/from Stand Sit to Stand: Supervision;Min guard         General transfer comment: Patient performed sit to from stand from the bed and from the toilet with supervision and minimal guarding. Patient was impulsive and repeatedly performed sit to stand from bed without following therapists verbal cues, demonstration, or tactile cues.   Ambulation/Gait Ambulation/Gait assistance: Supervision;Min guard;Modified independent (Device/Increase time) Ambulation Distance (Feet): 75 Feet Assistive device: Rolling walker (2 wheeled);None(Patient ambulated 10 feet with rolling walker and 65 feet without assistive device) Gait Pattern/deviations: Step-through pattern;Decreased stride length   Gait velocity interpretation: Below normal speed for age/gender General Gait Details: Patient demonstrated decreased stride length and some decreased velocity, but overall appeared well balanced with supervision assistance  Stairs            Wheelchair Mobility    Modified Rankin (Stroke Patients Only)       Balance Overall balance assessment: Independent(Patient demonstrated good sitting balance at edge of bed with lower extremities supported. Patient demonstrated good standing balance without assistance or assistive device. )  Pertinent Vitals/Pain Pain  Assessment: 0-10 Pain Score: (Patient reported pain in her head and continued to hold her head intermittently, but did not quantify ) Pain Location: Head Pain Descriptors / Indicators: (Patient did not describe the pain, but was holding her head) Pain Intervention(s): Limited activity within patient's tolerance;Monitored during session    Home Living Family/patient expects to be discharged to:: Skilled nursing facility                 Additional Comments: Patient was unable to report where she was living before admission to hospital so information is from chart review    Prior Function Level of Independence: Needs assistance   Gait / Transfers Assistance Needed: Patient was unable to report the level of assistance she required before admission, but from chart review patient lives at a skilled nursing facility   ADL's / Homemaking Assistance Needed: Patient was unable to report the level of assistance she required before admission, but from chart review patient lives at a skilled nursing facility         Hand Dominance        Extremity/Trunk Assessment   Upper Extremity Assessment Upper Extremity Assessment: Defer to OT evaluation    Lower Extremity Assessment Lower Extremity Assessment: Generalized weakness;Difficult to assess due to impaired cognition(With heel to shin coordination test, patient was unable to perform on either side with verbal cues or demonstration)    Cervical / Trunk Assessment Cervical / Trunk Assessment: Normal  Communication   Communication: Expressive difficulties;Receptive difficulties  Cognition Arousal/Alertness: Awake/alert Behavior During Therapy: Agitated(Agitated during attempts for manual muscle test stating therapist thought she was "crazy") Overall Cognitive Status: No family/caregiver present to determine baseline cognitive functioning                                 General Comments: Cognitively patient followed  1-step commands inconsistently and required frequent verbal tactile cues to perform activities. Patient became agitated during manual muscle testing which made muscle testing unreliable.       General Comments      Exercises     Assessment/Plan    PT Assessment Patient needs continued PT services  PT Problem List Decreased strength;Decreased activity tolerance;Decreased coordination;Decreased cognition;Decreased safety awareness       PT Treatment Interventions DME instruction;Gait training;Functional mobility training;Therapeutic activities;Therapeutic exercise;Balance training;Neuromuscular re-education;Cognitive remediation;Patient/family education    PT Goals (Current goals can be found in the Care Plan section)  Acute Rehab PT Goals Patient Stated Goal: (Patient was not cognitively able to state goals, but could return to skilled nursing facility with therapy if chart review was correct) PT Goal Formulation: Patient unable to participate in goal setting Time For Goal Achievement: 03/01/18 Potential to Achieve Goals: Good    Frequency 7X/week   Barriers to discharge        Co-evaluation               AM-PAC PT "6 Clicks" Daily Activity  Outcome Measure Difficulty turning over in bed (including adjusting bedclothes, sheets and blankets)?: A Little Difficulty moving from lying on back to sitting on the side of the bed? : A Little Difficulty sitting down on and standing up from a chair with arms (e.g., wheelchair, bedside commode, etc,.)?: A Little Help needed moving to and from a bed to chair (including a wheelchair)?: A Little Help needed walking in hospital room?: None Help needed climbing 3-5 steps with a  railing? : A Lot 6 Click Score: 18    End of Session Equipment Utilized During Treatment: Gait belt Activity Tolerance: Patient tolerated treatment well;Patient limited by pain;Treatment limited secondary to agitation Patient left: in bed;with call bell/phone  within reach;with bed alarm set Nurse Communication: Mobility status(Patient's Purewick needing to be reapplied) PT Visit Diagnosis: Unsteadiness on feet (R26.81);Other abnormalities of gait and mobility (R26.89);Muscle weakness (generalized) (M62.81)    Time: 4888-9169 PT Time Calculation (min) (ACUTE ONLY): 26 min   Charges:   PT Evaluation $PT Eval Low Complexity: 1 Low     PT G Codes:        Clarene Critchley PT, DPT 2:17 PM, 02/22/18 [phone]

## 2018-02-23 ENCOUNTER — Inpatient Hospital Stay (HOSPITAL_COMMUNITY)
Admit: 2018-02-23 | Discharge: 2018-02-23 | Disposition: A | Discharge status: Home / Self Care | Payer: Medicare Other | Attending: Neurology | Admitting: Neurology

## 2018-02-23 ENCOUNTER — Inpatient Hospital Stay (HOSPITAL_COMMUNITY): Payer: Medicare Other

## 2018-02-23 ENCOUNTER — Encounter (HOSPITAL_COMMUNITY): Payer: Self-pay | Admitting: Primary Care

## 2018-02-23 DIAGNOSIS — Z515 Encounter for palliative care: Secondary | ICD-10-CM

## 2018-02-23 DIAGNOSIS — Z7189 Other specified counseling: Secondary | ICD-10-CM

## 2018-02-23 DIAGNOSIS — N183 Chronic kidney disease, stage 3 (moderate): Secondary | ICD-10-CM

## 2018-02-23 LAB — GLUCOSE, CAPILLARY
GLUCOSE-CAPILLARY: 239 mg/dL — AB (ref 65–99)
Glucose-Capillary: 99 mg/dL (ref 65–99)
Glucose-Capillary: 132 mg/dL — ABNORMAL HIGH (ref 65–99)
Glucose-Capillary: 155 mg/dL — ABNORMAL HIGH (ref 65–99)
Glucose-Capillary: 183 mg/dL — ABNORMAL HIGH (ref 65–99)

## 2018-02-23 LAB — VITAMIN B12: Vitamin B-12: 407 pg/mL (ref 180–914)

## 2018-02-23 MED ORDER — GADOBENATE DIMEGLUMINE 529 MG/ML IV SOLN
10.0000 mL | Freq: Once | INTRAVENOUS | Status: AC | PRN
  Administered 2018-02-23: 7 mL via INTRAVENOUS

## 2018-02-23 MED ORDER — METOPROLOL TARTRATE 5 MG/5ML IV SOLN
10.0000 mg | Freq: Once | INTRAVENOUS | Status: AC
  Administered 2018-02-23: 10 mg via INTRAVENOUS
  Filled 2018-02-23: qty 10

## 2018-02-23 MED ORDER — LORAZEPAM 2 MG/ML IJ SOLN: 1.0000 mg | Freq: Once | INTRAMUSCULAR | Status: DC

## 2018-02-23 MED ORDER — LEVETIRACETAM 250 MG PO TABS
250.0000 mg | ORAL_TABLET | Freq: Two times a day (BID) | ORAL | Status: DC
  Administered 2018-02-23 – 2018-02-24 (×2): 250 mg via ORAL
  Filled 2018-02-23 (×2): qty 1

## 2018-02-23 NOTE — Progress Notes (Signed)
HR entering 130s and 140s maxing at 155. Sustained in 140s. BP 171/73. Midlevel contacted for orders. Order given for 10mg  Lopressor IV. HR back down to 88 after administration.

## 2018-02-23 NOTE — Telephone Encounter (Signed)
Dr.Xu FYI see note.

## 2018-02-23 NOTE — Telephone Encounter (Signed)
EEG appt cancel per Dr. Erlinda Hong.

## 2018-02-23 NOTE — Evaluation (Signed)
Clinical/Bedside Swallow Evaluation Patient Details  Name: Sarah Duarte MRN: [id number] Date of Birth: 30-Jun-1932  Today's Date: 02/23/2018 Time: SLP Start Time (ACUTE ONLY): 2725 SLP Stop Time (ACUTE ONLY): 1614 SLP Time Calculation (min) (ACUTE ONLY): 44 min  Past Medical History:  Past Medical History:  Diagnosis Date  . Adenomatous polyp    h/o  . Asthma   . Chronic nausea   . COPD (chronic obstructive pulmonary disease) ([place])   . Diabetes ([place])   . Disorientation, unspecified   . GERD (gastroesophageal reflux disease)   . Hemorrhoids    intermittently,bleeding  . Hyperlipidemia   . Hypertension   . Stroke [facility])    Past Surgical History:  Past Surgical History:  Procedure Laterality Date  . BREAST BIOPSY     right,for benign disease  . COLONOSCOPY  07/30/2002   DGU:[id number] rectum/Transverse left-sided diverticulum/The remainder of the colonic mucosa appeared normal  . COLONOSCOPY N/A 09/14/2014   Procedure: COLONOSCOPY;  Surgeon: Daneil Dolin, MD;  Location: [facility];  Service: Endoscopy;  Laterality: N/A;  8:30 AM - moved to 9:30 - Ginger to notify pt  . TONSILLECTOMY     HPI:  Sarah Duarte is a 84 y.o. female with medical history significant for insulin-dependent diabetes mellitus, hypertension, COPD, chronic kidney disease  stage III, and paroxysmal atrial fibrillation, now presenting from her SNF for evaluation of acute onset expressive aphasia and increased confusion.  Patient was reportedly in her usual state at approximately 4 PM, but noted to have difficulty with her speech and increased confusion excellently 15 minutes later.  She was transported to the ED for evaluation of this.  The patient does not have any complaints.  There was no recent fall or trauma reported.  She has been following with neurology as an outpatient for possible stroke.   Pt recently saw Dr. [place] Bells and he ordered [facility], which is scheduled for later this week in [place]. Pt's son  reported that Pt has been having more trouble breathing and he says that she has been vomiting nearly every time she eats a meal and has been losing weight: has lost 13 pounds. She coughs every now and then she will get a coughing spell while eating.  Assessment / Plan / Recommendation Clinical Impression  Clinical swallow evaluation completed at bedside. Pt noted to be clearing throat prior to BSE. Oral motor examination is WNL. Pt endorses mild hoarse vocal quality. She also reports occasional vomiting after meals at [facility] (she thinks it is due to the type of food served). Pt failed RN swallow screen earlier today due to delayed throat clearing. Pt without overt signs or symptoms of aspiration during clinical swallow evaluation, however Pt does clear her throat periodically. Pt consumed her entire lunch tray without incident. SLP spoke with Pt's son, Richardson Landry, who relayed recent history of "double pneumonia in November", vomiting, and some coughing post meals. Given Dr. Anastasia Pall concern for dysphagia and aspiration, will go ahead and complete MBSS during this admission. Will complete tomorrow. Will initiate D3/mech soft and thin liquids with po medications presented one at a time with water.    SLP Visit Diagnosis: Dysphagia, unspecified (R13.10)    Aspiration Risk  Mild aspiration risk    Diet Recommendation Dysphagia 3 (Mech soft);Thin liquid   Liquid Administration via: Cup;Straw Medication Administration: Whole meds with liquid Supervision: Patient able to self feed;Intermittent supervision to cue for compensatory strategies Compensations: Slow rate Postural Changes: Seated upright at 90 degrees;Remain upright  for at least 30 minutes after po intake    Other  Recommendations Oral Care Recommendations: Oral care BID;Staff/trained caregiver to provide oral care Other Recommendations: Clarify dietary restrictions   Follow up Recommendations 24 hour supervision/assistance(SLP continuation  at ALF)      Frequency and Duration min 2x/week  1 week       Prognosis Prognosis for Safe Diet Advancement: Good Barriers to Reach Goals: Cognitive deficits      Swallow Study   General Date of Onset: 02/16/18 HPI: Sarah Duarte is a 84 y.o. female with medical history significant for insulin-dependent diabetes mellitus, hypertension, COPD, chronic kidney disease  stage III, and paroxysmal atrial fibrillation, now presenting from her SNF for evaluation of acute onset expressive aphasia and increased confusion.  Patient was reportedly in her usual state at approximately 4 PM, but noted to have difficulty with her speech and increased confusion excellently 15 minutes later.  She was transported to the ED for evaluation of this.  The patient does not have any complaints.  There was no recent fall or trauma reported.  She has been following with neurology as an outpatient for possible stroke. Type of Study: Bedside Swallow Evaluation Diet Prior to this Study: NPO Temperature Spikes Noted: No Respiratory Status: Nasal cannula History of Recent Intubation: No Behavior/Cognition: Alert;Cooperative;Pleasant mood Oral Cavity Assessment: Within Functional Limits Oral Care Completed by SLP: No Oral Cavity - Dentition: Adequate natural dentition Vision: Functional for self-feeding Self-Feeding Abilities: Able to feed self Patient Positioning: Upright in bed Baseline Vocal Quality: Normal;Low vocal intensity Volitional Cough: Strong Volitional Swallow: Able to elicit    Oral/Motor/Sensory Function Overall Oral Motor/Sensory Function: Within functional limits   Ice Chips Ice chips: Within functional limits Presentation: Spoon   Thin Liquid Thin Liquid: Within functional limits Presentation: Cup;Self Fed;Spoon;Straw    Nectar Thick Nectar Thick Liquid: Not tested   Honey Thick Honey Thick Liquid: Not tested   Puree Puree: Within functional limits Presentation: Spoon   Solid   Thank  you,  Genene Churn, CCC-SLP [phone]    Solid: Within functional limits Presentation: Self Fed        PORTER,DABNEY 02/23/2018,4:56 PM

## 2018-02-23 NOTE — Progress Notes (Signed)
EEG completed, results pending. 

## 2018-02-23 NOTE — Progress Notes (Signed)
PROGRESS NOTE    Sarah Duarte  QBH:[id number] DOB: [birth year] DOA: 02/20/2018 PCP: Glenda Chroman, MD     Brief Narrative:  84 year old woman admitted from assisted living facility on 3/22 due to increased confusion and expressive aphasia.  She has a history of hypertension, insulin-dependent diabetes, stage III chronic kidney disease and paroxysmal atrial fibrillation who is chronically anticoagulated on Xarelto.  She was sent to the emergency department from her ALF when she started having confusion and speech difficulty at about 4 PM.  Subsequent MRI has confirmed CVA and admission was requested.   Assessment & Plan:   Principal Problem:   Stroke [facility]) Active Problems:   Diabetes mellitus ([place])   Essential hypertension   Paroxysmal atrial fibrillation (HCC)   COPD (chronic obstructive pulmonary disease) (HCC)   Anxiety   CKD (chronic kidney disease), stage III (HCC)   Acute CVA (cerebrovascular accident) ([place])   Goals of care, counseling/discussion   Palliative care by specialist   DNR (do not resuscitate) discussion   Acute/subacute infarction in the left white matter -Seen by tele-neurology, decision was made to not give TPA as she is chronically on Xarelto. -Continue aspirin  -Echo: Ejection fraction of 60-65% with normal left ventricular diastolic function parameters, no wall motion abnormalities, no PFO was identified. -Dopplers: Less than 50% bilateral ICA stenosis, antegrade vertebral flow. -PT recommends SNF. -Discussed case with speech therapy today, they plan to perform the modified barium swallow study tomorrow. -LDL is 70, continue rosuvastatin.  Atrial fibrillation -Rate controlled, currently in sinus rhythm. -We will resume Xarelto as per recommendations by tele neurology.  Stage III chronic kidney disease -Creatinine remains at baseline of around 1.2-1.4  Insulin-dependent diabetes -CBGs are much better controlled with increase Lantus to 10 units  ordered on 3/23. -Continue current management.  Hypertension -BP slightly elevated in the 097-353 range systolic, antihypertensive agents have been held in face of acute CVA in order to allow for permissive hypertension, consider restarting meds in 24 hours if stable and if speech clears for oral diet.  Dementia -Stable, no behavioral disturbances. -Continue Aricept, Seroquel  COPD -Stable, continue as needed nebs and long-acting bronchodilator as well as inhaled corticosteroids.    DVT prophylaxis: On Xarelto Code Status: Full code Family Communication: Patient only  disposition Plan: Back to ALF once speech therapy evaluation is complete.  Consultants:   None  Procedures:   As above  Antimicrobials:  Anti-infectives (From admission, onward)   None       Subjective: Lying in bed, still confused, has significant expressive aphasia  Objective: Vitals:   02/23/18 0905 02/23/18 1100 02/23/18 1121 02/23/18 1648  BP:   (!) 164/74   Pulse:    81  Resp:    17  Temp:  97.9 F (36.6 C)  98.5 F (36.9 C)  TempSrc:    Oral  SpO2: 97%   94%  Weight:      Height:        Intake/Output Summary (Last 24 hours) at 02/23/2018 1659 Last data filed at 02/23/2018 1128 Gross per 24 hour  Intake 246 ml  Output 375 ml  Net -129 ml   Filed Weights   02/20/18 1748 02/22/18 0500 02/23/18 0500  Weight: 68 kg (150 lb) 67.7 kg (149 lb 4 oz) 68 kg (149 lb 14.6 oz)    Examination:  General exam: Awake, pleasant, can tell me her name and the city but when asked other questions is not able to respond Respiratory system:  Clear to auscultation. Respiratory effort normal. Cardiovascular system:RRR. No murmurs, rubs, gallops. Gastrointestinal system: Abdomen is nondistended, soft and nontender. No organomegaly or masses felt. Normal bowel sounds heard. Central nervous system: Moves all 4 spontaneously Extremities: No C/C/E, +pedal pulses Skin: No rashes, lesions or  ulcers Psychiatry: unable to fully assess given expressive aphasia      Data Reviewed: I have personally reviewed following labs and imaging studies  CBC: Recent Labs  Lab 02/20/18 1752 02/20/18 1821  WBC 8.5  --   NEUTROABS 4.5  --   HGB 12.4 13.3  HCT 39.3 39.0  MCV 79.7  --   PLT 266  --    Basic Metabolic Panel: Recent Labs  Lab 02/20/18 1752 02/20/18 1821  NA 139 139  K 4.3 4.3  CL 101 100*  CO2 23  --   GLUCOSE 152* 147*  BUN 25* 23*  CREATININE 1.28* 1.20*  CALCIUM 10.0  --    GFR: Estimated Creatinine Clearance: 32 mL/min (A) (by C-G formula based on SCr of 1.2 mg/dL (H)). Liver Function Tests: Recent Labs  Lab 02/20/18 1752  AST 27  ALT 12*  ALKPHOS 48  BILITOT 0.4  PROT 7.3  ALBUMIN 3.8   No results for input(s): LIPASE, AMYLASE in the last 168 hours. No results for input(s): AMMONIA in the last 168 hours. Coagulation Profile: Recent Labs  Lab 02/20/18 1752  INR 1.73   Cardiac Enzymes: No results for input(s): CKTOTAL, CKMB, CKMBINDEX, TROPONINI in the last 168 hours. BNP (last 3 results) No results for input(s): PROBNP in the last 8760 hours. HbA1C: Recent Labs    02/21/18 0557  HGBA1C 6.5*   CBG: Recent Labs  Lab 02/22/18 1719 02/22/18 2140 02/23/18 0744 02/23/18 1119 02/23/18 1650  GLUCAP 117* 190* 132* 155* 239*   Lipid Profile: Recent Labs    02/21/18 0557  CHOL 129  HDL 37*  LDLCALC 74  TRIG 88  CHOLHDL 3.5   Thyroid Function Tests: No results for input(s): TSH, T4TOTAL, FREET4, T3FREE, THYROIDAB in the last 72 hours. Anemia Panel: Recent Labs    02/23/18 0848  VITAMINB12 407   Urine analysis:    Component Value Date/Time   COLORURINE STRAW (A) 02/20/2018 2110   APPEARANCEUR CLEAR 02/20/2018 2110   LABSPEC 1.040 (H) 02/20/2018 2110   PHURINE 5.0 02/20/2018 2110   GLUCOSEU 150 (A) 02/20/2018 2110   HGBUR SMALL (A) 02/20/2018 2110   BILIRUBINUR NEGATIVE 02/20/2018 2110   KETONESUR 5 (A) 02/20/2018  2110   PROTEINUR 100 (A) 02/20/2018 2110   NITRITE NEGATIVE 02/20/2018 2110   LEUKOCYTESUR NEGATIVE 02/20/2018 2110   Sepsis Labs: @LABRCNTIP (procalcitonin:4,lacticidven:4)  ) Recent Results (from the past 240 hour(s))  MRSA PCR Screening     Status: None   Collection Time: 02/21/18 12:23 PM  Result Value Ref Range Status   MRSA by PCR NEGATIVE NEGATIVE Final    Comment:        The GeneXpert MRSA Assay (FDA approved for NASAL specimens only), is one component of a comprehensive MRSA colonization surveillance program. It is not intended to diagnose MRSA infection nor to guide or monitor treatment for MRSA infections. Performed at [facility], [address], [place], [place] [address]          Radiology Studies: Mr Jeri Cos Contrast  Result Date: 02/23/2018 CLINICAL DATA:  Expressive aphasia and confusion. Left temporoparietal signal abnormality on prior MRIs. EXAM: MRI HEAD WITH CONTRAST TECHNIQUE: Multiplanar, multiecho pulse sequences of the brain and  surrounding structures were obtained with intravenous contrast. CONTRAST:  77mL MULTIHANCE GADOBENATE DIMEGLUMINE 529 MG/ML IV SOLN COMPARISON:  Brain MRIs 02/20/2018, 12/26/2017, and 11/21/2017 FINDINGS: The study is mildly motion degraded. No abnormal brain parenchymal or meningeal enhancement is identified. The small foci of cortical and intravascular enhancement in the posterior left cerebral hemisphere on the 11/21/2017 study have resolved. The major dural venous sinuses enhance normally. No intracranial mass lesion is identified. IMPRESSION: No abnormal intracranial enhancement. Enhancement on the 11/21/2017 MRI has resolved. Electronically Signed   By: Logan Bores M.D.   On: 02/23/2018 11:27   US Carotid Bilateral  Result Date: 02/22/2018 CLINICAL DATA:  Speech difficulty.  Stroke. EXAM: BILATERAL CAROTID DUPLEX ULTRASOUND TECHNIQUE: Pearline Cables scale imaging, color Doppler and duplex ultrasound were performed of bilateral  carotid and vertebral arteries in the neck. COMPARISON:  Brain MR 02/20/2018 FINDINGS: Criteria: Quantification of carotid stenosis is based on velocity parameters that correlate the residual internal carotid diameter with NASCET-based stenosis levels, using the diameter of the distal internal carotid lumen as the denominator for stenosis measurement. The following velocity measurements were obtained: RIGHT ICA:  60 cm/sec CCA:  66 cm/sec SYSTOLIC ICA/CCA RATIO:  1.1 DIASTOLIC ICA/CCA RATIO:  1.8 ECA:  65 cm/sec LEFT ICA:  69 cm/sec CCA:  67 cm/sec SYSTOLIC ICA/CCA RATIO:  1.0 DIASTOLIC ICA/CCA RATIO:  0.9 ECA:  64 cm/sec RIGHT CAROTID ARTERY: Intimal thickening in the internal carotid artery and carotid bulb. External carotid artery is patent with normal waveform. Normal waveforms and velocities in the internal carotid artery. RIGHT VERTEBRAL ARTERY: Antegrade flow and normal waveform in the right vertebral artery. LEFT CAROTID ARTERY: Intimal thickening in the distal common carotid artery. Eccentric echogenic plaque at the left carotid bulb. External carotid artery is patent with normal waveform. Intimal thickening and mild plaque in the proximal internal carotid artery. Normal waveforms and velocities in the internal carotid artery. LEFT VERTEBRAL ARTERY: Antegrade flow and normal waveform in the left vertebral artery. IMPRESSION: Mild atherosclerotic disease in the carotid arteries. Focus of eccentric plaque at the left carotid bulb. Estimated degree of stenosis in the internal carotid arteries is less than 50% bilaterally. Patent vertebral arteries with antegrade flow. Electronically Signed   By: Markus Daft M.D.   On: 02/22/2018 15:34        Scheduled Meds: .  stroke: mapping our early stages of recovery book   Does not apply Once  . aspirin  300 mg Rectal Daily   Or  . aspirin  325 mg Oral Daily  . calcium-vitamin D  1 tablet Oral BID  . diltiazem  180 mg Oral Daily  . donepezil  10 mg Oral QHS   . famotidine  20 mg Oral QHS  . insulin aspart  0-5 Units Subcutaneous QHS  . insulin aspart  0-9 Units Subcutaneous TID WC  . insulin glargine  10 Units Subcutaneous QHS  . loratadine  10 mg Oral Daily  . LORazepam  1 mg Intravenous Once  . mouth rinse  15 mL Mouth Rinse BID  . mometasone-formoterol  2 puff Inhalation BID  . omega-3 acid ethyl esters  1,000 mg Oral Daily  . pantoprazole  40 mg Oral Daily  . QUEtiapine  12.5 mg Oral QHS  . rivaroxaban  15 mg Oral Daily  . rosuvastatin  2.5 mg Oral q1800  . sodium chloride flush  3 mL Intravenous Q12H  . traZODone  50 mg Oral QHS   Continuous Infusions: . sodium chloride  LOS: 1 day    Time spent: 25 minutes. Greater than 50% of this time was spent in direct contact with the patient coordinating care.     Lelon Frohlich, MD [facility] Pager [phone]  If 7PM-7AM, please contact night-coverage [email or web address] Password [facility] 02/23/2018, 4:59 PM

## 2018-02-23 NOTE — Progress Notes (Signed)
Physical Therapy Treatment Patient Details Name: Sarah Duarte MRN: [id number] DOB: 1931-12-17 Today's Date: 02/23/2018    History of Present Illness Sarah Duarte is a 84 y.o. female with medical history significant for insulin-dependent diabetes mellitus, hypertension, COPD, chronic kidney disease  stage III, and paroxysmal atrial fibrillation, now presenting from her SNF for evaluation of acute onset expressive aphasia and increased confusion.  Patient was reportedly in her usual state at approximately 4 PM, but noted to have difficulty with her speech and increased confusion excellently 15 minutes later.  She was transported to the ED for evaluation of this.  The patient does not have any complaints.  There was no recent fall or trauma reported.  She has been following with neurology as an outpatient for possible stroke.    PT Comments    Patient demonstrates good return for completing exercises, able to follow instructions consistently, no loss of balance during gait training and transfers, occasional leaning on nearby objects for support and tolerated sitting up in chair after therapy.  Case manager notified patient can benefit from use of single point cane for home use.  Patient will benefit from continued physical therapy in hospital and recommended venue below to increase strength, balance, endurance for safe ADLs and gait.    Follow Up Recommendations  Home health PT;Supervision for mobility/OOB     Equipment Recommendations  Cane    Recommendations for Other Services       Precautions / Restrictions Precautions Precautions: Fall Restrictions Weight Bearing Restrictions: No    Mobility  Bed Mobility Overal bed mobility: Needs Assistance Bed Mobility: Supine to Sit     Supine to sit: Supervision     General bed mobility comments: slightly labored movement  Transfers Overall transfer level: Needs assistance Equipment used: None Transfers: Sit to/from Stand;Stand  Pivot Transfers Sit to Stand: Supervision Stand pivot transfers: Supervision       General transfer comment: tendency to lean on near by objects for support  Ambulation/Gait Ambulation/Gait assistance: Min guard Ambulation Distance (Feet): 100 Feet Assistive device: None Gait Pattern/deviations: Drifts right/left   Gait velocity interpretation: Below normal speed for age/gender General Gait Details: Patient occasionally leans on near by objects for support, able to answer questions and gesture with UEs while walking without loss of balance, occasional unsteadiness, but able to self correct   Stairs            Wheelchair Mobility    Modified Rankin (Stroke Patients Only)       Balance Overall balance assessment: Mild deficits observed, not formally tested                                          Cognition Arousal/Alertness: Awake/alert   Overall Cognitive Status: Within Functional Limits for tasks assessed                                 General Comments: slight expressive aphasia but understands all directions given      Exercises General Exercises - Lower Extremity Long Arc Quad: Seated;AROM;Strengthening;Both;10 reps Hip Flexion/Marching: Seated;AROM;Strengthening;Both;10 reps Toe Raises: Seated;AROM;Strengthening;Both;10 reps Heel Raises: Seated;Strengthening;AROM;Both;10 reps    General Comments        Pertinent Vitals/Pain Pain Assessment: No/denies pain    Home Living  Prior Function            PT Goals (current goals can now be found in the care plan section) Acute Rehab PT Goals Patient Stated Goal: return home PT Goal Formulation: With patient Time For Goal Achievement: 02/27/18 Potential to Achieve Goals: Good Progress towards PT goals: Progressing toward goals    Frequency    7X/week      PT Plan Current plan remains appropriate    Co-evaluation               AM-PAC PT "6 Clicks" Daily Activity  Outcome Measure  Difficulty turning over in bed (including adjusting bedclothes, sheets and blankets)?: None Difficulty moving from lying on back to sitting on the side of the bed? : None Difficulty sitting down on and standing up from a chair with arms (e.g., wheelchair, bedside commode, etc,.)?: A Little Help needed moving to and from a bed to chair (including a wheelchair)?: A Little Help needed walking in hospital room?: A Little Help needed climbing 3-5 steps with a railing? : A Little 6 Click Score: 20    End of Session   Activity Tolerance: Patient tolerated treatment well Patient left: in chair;with call bell/phone within reach Nurse Communication: Mobility status;Other (comment)(nursing staff aware patient left up in chair) PT Visit Diagnosis: Unsteadiness on feet (R26.81);Other abnormalities of gait and mobility (R26.89);Muscle weakness (generalized) (M62.81)     Time: 4917-9150 PT Time Calculation (min) (ACUTE ONLY): 20 min  Charges:  $Therapeutic Activity: 8-22 mins                    G Codes:       2:43 PM, March 17, 2018 Lonell Grandchild, MPT Physical Therapist with [facility] [phone] office [phone] mobile phone

## 2018-02-23 NOTE — Evaluation (Signed)
Occupational Therapy Evaluation Patient Details Name: Sarah Duarte MRN: [id number] DOB: 1932-09-27 Today's Date: 02/23/2018    History of Present Illness Sarah Duarte is a 84 y.o. female with medical history significant for insulin-dependent diabetes mellitus, hypertension, COPD, chronic kidney disease  stage III, and paroxysmal atrial fibrillation, now presenting from her SNF for evaluation of acute onset expressive aphasia and increased confusion.  Patient was reportedly in her usual state at approximately 4 PM, but noted to have difficulty with her speech and increased confusion excellently 15 minutes later.  She was transported to the ED for evaluation of this.  The patient does not have any complaints.  There was no recent fall or trauma reported.  She has been following with neurology as an outpatient for possible stroke.   Clinical Impression   Pt admitted with confusion and expressive aphasia, with positive diagnosis of CVA. Pt currently with functional limitations due to the deficits listed below (see OT Problem List). Patient able to complete B/ADLS and follow simple commands from OT this date.  Patient appears to be at baseline with ADL completion.  BUE A/ROM and strength is [facility] for ADL completion.  Continued skilled OT intervention not indicated at this time.  Recommend DC to SNF.     Follow Up Recommendations  Supervision/Assistance - 24 hour    Equipment Recommendations  None recommended by OT    Recommendations for Other Services PT consult;Speech consult     Precautions / Restrictions Precautions Precautions: Fall Restrictions Weight Bearing Restrictions: No      Mobility Bed Mobility               General bed mobility comments: able to scoot self and adjust positioning with verbal cues this date with head of bed elevated   Transfers                      Balance                                           ADL either performed  or assessed with clinical judgement   ADL Overall ADL's : At baseline                                       General ADL Comments: after setup, patient able to wash and dry face and hands and comb hair.       Vision Baseline Vision/History: No visual deficits       Perception     Praxis      Pertinent Vitals/Pain Pain Assessment: 0-10 Pain Score: 0-No pain     Hand Dominance     Extremity/Trunk Assessment Upper Extremity Assessment Upper Extremity Assessment: Overall WFL for tasks assessed(BUE A/ROM and strength is [facility] for ADL tasks )   Lower Extremity Assessment Lower Extremity Assessment: Defer to PT evaluation   Cervical / Trunk Assessment Cervical / Trunk Assessment: Normal   Communication Communication Communication: Expressive difficulties   Cognition Arousal/Alertness: Awake/alert Behavior During Therapy: WFL for tasks assessed/performed Overall Cognitive Status: No family/caregiver present to determine baseline cognitive functioning                                 General  Comments: patient able to follow commands and converse with therapist, repeating self often    General Comments       Exercises     Shoulder Instructions      Home Living Family/patient expects to be discharged to:: Skilled nursing facility                                 Additional Comments: Patient states she was residing at a [place] facility,, however she could not recall the name.  she could not state how much assistance she recieved prior to admission       Prior Functioning/Environment Level of Independence: Needs assistance  Gait / Transfers Assistance Needed: Patient was unable to report the level of assistance she required before admission, but from chart review patient lives at a skilled nursing facility  ADL's / Homemaking Assistance Needed: Patient was unable to report the level of assistance she required before  admission, but from chart review patient lives at a skilled nursing facility             OT Problem List: Decreased cognition      OT Treatment/Interventions:      OT Goals(Current goals can be found in the care plan section) Acute Rehab OT Goals Patient Stated Goal: did not state a goal when prompted  OT Goal Formulation: With patient  OT Frequency:     Barriers to D/C:            Co-evaluation              AM-PAC PT "6 Clicks" Daily Activity     Outcome Measure Help from another person eating meals?: A Little Help from another person taking care of personal grooming?: A Little Help from another person toileting, which includes using toliet, bedpan, or urinal?: A Little Help from another person bathing (including washing, rinsing, drying)?: A Little Help from another person to put on and taking off regular upper body clothing?: A Little Help from another person to put on and taking off regular lower body clothing?: A Little 6 Click Score: 18   End of Session    Activity Tolerance:   Patient left: in bed;with call bell/phone within reach;with bed alarm set;Other (comment)(respiratory present in room)  OT Visit Diagnosis: Other symptoms and signs involving cognitive function                Time: 3832-9191 OT Time Calculation (min): 15 min Charges:  OT General Charges $OT Visit: 1 Visit OT Evaluation $OT Eval Low Complexity: 1 Low G-CodesVangie Bicker, [place], OTR/L [phone]  02/23/2018, 9:13 AM

## 2018-02-23 NOTE — Procedures (Signed)
[place] A. Merlene Laughter, MD     [email or web address]           HISTORY: This is a 84 year old presents with recurrent episodes of confusion.  MEDICATIONS: Scheduled Meds: .  stroke: mapping our early stages of recovery book   Does not apply Once  . aspirin  300 mg Rectal Daily   Or  . aspirin  325 mg Oral Daily  . calcium-vitamin D  1 tablet Oral BID  . diltiazem  180 mg Oral Daily  . donepezil  10 mg Oral QHS  . famotidine  20 mg Oral QHS  . insulin aspart  0-5 Units Subcutaneous QHS  . insulin aspart  0-9 Units Subcutaneous TID WC  . insulin glargine  10 Units Subcutaneous QHS  . loratadine  10 mg Oral Daily  . LORazepam  1 mg Intravenous Once  . mouth rinse  15 mL Mouth Rinse BID  . mometasone-formoterol  2 puff Inhalation BID  . omega-3 acid ethyl esters  1,000 mg Oral Daily  . pantoprazole  40 mg Oral Daily  . QUEtiapine  12.5 mg Oral QHS  . rivaroxaban  15 mg Oral Daily  . rosuvastatin  2.5 mg Oral q1800  . sodium chloride flush  3 mL Intravenous Q12H  . traZODone  50 mg Oral QHS   Continuous Infusions: . sodium chloride     PRN Meds:.sodium chloride, acetaminophen **OR** acetaminophen (TYLENOL) oral liquid 160 mg/5 mL **OR** acetaminophen, albuterol, ALPRAZolam, guaiFENesin-dextromethorphan, ondansetron (ZOFRAN) IV, senna-docusate, sodium chloride flush  Prior to Admission medications   Medication Sig Start Date End Date Taking? Authorizing Provider  acetaminophen (TYLENOL) 500 MG tablet Take 1,000 mg by mouth every 6 (six) hours as needed for mild pain or headache.   Yes [provider]  budesonide-formoterol (SYMBICORT) 160-4.5 MCG/ACT inhaler Inhale 2 puffs into the lungs 2 (two) times daily. 02/11/18  Yes Juanito Doom, MD  Calcium Carbonate-Vitamin D (CALTRATE 600+D) 600-400 MG-UNIT per tablet Take 1 tablet by mouth 2 (two) times daily.   Yes [provider]  diltiazem (CARDIZEM CD) 180 MG 24 hr capsule Take 1 capsule  (180 mg total) by mouth daily. 01/19/18  Yes Herminio Commons, MD  donepezil (ARICEPT) 10 MG tablet Take 1 tablet (10 mg total) by mouth at bedtime. 01/10/18  Yes Rosalin Hawking, MD  ergocalciferol (VITAMIN D2) 50000 UNITS capsule Take 50,000 Units by mouth every 14 (fourteen) days.    Yes [provider]  glipiZIDE (GLUCOTROL) 5 MG tablet Take 1 tablet (5 mg total) by mouth daily before breakfast. 12/04/17  Yes Tat, Shanon Brow, MD  guaiFENesin-dextromethorphan (ROBITUSSIN DM) 100-10 MG/5ML syrup Take 5 mLs by mouth 2 (two) times daily as needed for cough.   Yes [provider]  LANTUS SOLOSTAR 100 UNIT/ML Solostar Pen Inject 12 Units into the skin daily.  02/11/18  Yes [provider]  loratadine (CLARITIN) 10 MG tablet Take 10 mg by mouth daily.   Yes [provider]  metFORMIN (GLUCOPHAGE) 500 MG tablet Take 1,000 mg by mouth 2 (two) times daily with a meal.    Yes [provider]  metoprolol tartrate (LOPRESSOR) 25 MG tablet Take 1 tablet (25 mg total) by mouth 2 (two) times daily. 12/04/17  Yes Tat, Shanon Brow, MD  ondansetron (ZOFRAN) 4 MG tablet Take 4 mg by mouth every 8 (eight) hours as needed for nausea or vomiting.   Yes [provider]  pantoprazole (PROTONIX) 40 MG tablet Take 40 mg by  mouth daily.  07/06/13  Yes [provider]  QUEtiapine (SEROQUEL) 25 MG tablet Take 0.5 tablets (12.5 mg total) by mouth at bedtime. 02/16/18  Yes Rosalin Hawking, MD  ranitidine (ZANTAC) 300 MG tablet Take 300 mg by mouth at bedtime.   Yes [provider]  Rivaroxaban (XARELTO) 15 MG TABS tablet Take 1 tablet (15 mg total) by mouth daily with supper. 12/04/17  Yes Tat, Shanon Brow, MD  rosuvastatin (CRESTOR) 5 MG tablet Take 2.5 mg by mouth daily.     Yes [provider]  traZODone (DESYREL) 50 MG tablet Take 50 mg by mouth at bedtime.  02/09/18  Yes [provider]  valsartan-hydrochlorothiazide (DIOVAN-HCT) 320-12.5 MG tablet Take 1 tablet by  mouth daily.   Yes [provider]  VENTOLIN HFA 108 (90 Base) MCG/ACT inhaler Inhale 1 puff into the lungs 3 (three) times daily. 11/27/17  Yes [provider]      ANALYSIS: A 16 channel recording using standard 10 20 measurements is conducted for 22 minutes.   There is a well-formed posterior dominant rhythm of 12 hertz which attenuates eye opening.  There is beta activity observed in the frontal areas.  Awake and drowsy activities are observed.  Photic stimulation and hyperventilation are not conducted.  There is infrequent slowing noted on the right side particularly the right temporal region F8 - T4.  Additionally, there are rare are spike slow activity that phase reverses at the F8 corresponding to the right anterior temple region.   IMPRESSION: 1.  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 2.  Right anterior the temporal focal slow-wave activity.    Given the above, the patient will be started on antiepileptic medication.   [place] Wadlow A. Merlene Laughter, M.D.  Diplomate, Tax adviser of Psychiatry and Neurology ( Neurology).

## 2018-02-23 NOTE — Clinical Social Work Note (Signed)
Clinical Social Work Assessment  Patient Details  Name: Sarah Duarte MRN: [id number] Date of Birth: 05/02/1932  Date of referral:  02/23/18               Reason for consult:  Discharge Planning                Permission sought to share information with:  Chartered certified accountant granted to share information::  Yes, Verbal Permission Granted  Name::        Agency::  [facility]  Relationship::     Contact Information:     Housing/Transportation Living arrangements for the past 2 months:  Assisted Living Facility([facility]) Source of Information:  Patient Patient Interpreter Needed:  None Criminal Activity/Legal Involvement Pertinent to Current Situation/Hospitalization:  No - Comment as needed Significant Relationships:  Adult Children Lives with:  Facility Resident Do you feel safe going back to the place where you live?  Yes Need for family participation in patient care:  No (Coment)  Care giving concerns: Pt resides at [place].   Social Worker assessment / plan: Pt is an 84 year old female referred to CSW due to admission from a facility. Pt is from [facility]. PT recommending return to the ALF with [facility] PT. Met with pt to update. Pt is in agreement with this plan. She asks LCSW to contact her son Remo Lipps regarding transportation at [employer].   LCSW spoke with Seth Bake at the ALF to update on pt.   Employment status:  Retired Nurse, adult PT Recommendations:  Home with [place] / Referral to community resources:     Patient/Family's Response to care:  Pt accepting of care.  Patient/Family's Understanding of and Emotional Response to Diagnosis, Current Treatment, and Prognosis: Pt appears to have a good understanding of her diagnosis and treatment recommendations. No emotional distress identified.  Emotional Assessment Appearance:  Appears younger than stated age Attitude/Demeanor/Rapport:   Engaged Affect (typically observed):  Pleasant Orientation:  Oriented to Self, Oriented to Place, Oriented to  Time, Oriented to Situation Alcohol / Substance use:  Not Applicable Psych involvement (Current and /or in the community):  No (Comment)  Discharge Needs  Concerns to be addressed:  Discharge Planning Concerns Readmission within the last 30 days:  No Current discharge risk:  None Barriers to Discharge:  No Barriers Identified   Shade Flood, LCSW 02/23/2018, 11:54 AM

## 2018-02-23 NOTE — Telephone Encounter (Signed)
The Xanax rx that was done on 02/19/2018 was verified by Elmyra Ricks CMA that it was voided and put in shredder.Pt will not be using she is back in the hospital.

## 2018-02-23 NOTE — Telephone Encounter (Signed)
Noted that pt is in [facility] for episode of confusion and aphasia. She had MRI with and without contrast and I reviewed with Dr. Sandi Mariscal in Radiology. Still showing left parietal T2 hyperintensity and DWI changes, but no progression comparing with prior. The small punctate enhancement in 11/2017 has resolved, no mass effect. The pattern not consistent with tumor or occupying lesion. Etiology not clear but could be chronic infarct with white matter ischemic changes. Cerebritis or demyelination not excluded but not high suspicious. EEG is being ordered to rule out seizure. Dr. Merlene Laughter is on board at this time.   Hi, Dana, please cancel the MRI with and without contrast and EEG I ordered on 02/16/18. They have or will be done in [facility]. She still has LP ordered in [place] and I will keep that at this time. Thanks.   Rosalin Hawking, MD PhD Stroke Neurology 02/23/2018 1:41 PM

## 2018-02-23 NOTE — Consult Note (Signed)
[place] A. Merlene Laughter, MD     [email or web address]          Sarah Duarte is an 84 y.o. female.   ASSESSMENT/PLAN:  1. This appears to be case of her recurring episodes of confusion and aphasia.  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  There has been concerns raised by Dr. Erlinda Hong about the left parietal lesion increasing in size but I do not appreciate this.  An EEG will be obtained.  MRI of of the brain with contrast will be done.  We will not do a spinal tap for now.  We may consider treating the patient empirically for complex partial seizures.  She is continue with antiplatelet agents.  She is also continue with the Aricept.  Dementia labs will be obtained.    The patient is an 84 year old white female who presents with acute onset of confusion and aphasia.  She has had recurrent episodes of this in the past.  At least 1 episode which initially occurred the in November 2018 with associated with acute pneumonia.  She has had repeat imaging showing evidence of left parietal increased signal on DWI although there has not been corresponding changes on the ADC scan.  She has seen the stroke neurologist Dr. Erlinda Hong the stroke neurologist who thought that the lesion had increased recommended repeat scan with contrast.  The patient herself cannot give an adequate history but she does report that she is amnestic to ever happened to her that she appears to have been confused.  She does not report focal neurological deficit.  She clearly has some evidence of aphasia on examination.  She does complain of in the some coughing which she of see has currently.  This appears to be nonproductive.  The review of systems although limited is unrevealing.  GENERAL:  This is a pleasant thin female who is in no acute distress.  HEENT:  Neck is supple no trauma appreciated.  ABDOMEN: Soft  EXTREMITIES: No edema    BACK: Normal alignment.  SKIN: Normal by inspection.    MENTAL STATUS:  She is awake and alert and cooperates with evaluation.  She is oriented to location.  She is not oriented to time.  She has significant difficulties with comprehension and perseverates quite a bit.  Naming is impaired on the right bedside testing with the patient missing about half of the objects tested.  CRANIAL NERVES: Pupils are equal, round and reactive to light and accommodation; extraocular movements are full, there is no significant nystagmus; upper and lower facial muscles are normal in strength and symmetric, there is no flattening of the nasolabial folds; tongue is midline; uvula is midline; shoulder elevation is normal.  Visual field deficits are not appreciated.  MOTOR:  Exact strength is limited due to impairment of comprehension and difficulty following commands but she appears to have at least 4/5 strength.  Bulk and tone normal throughout.  COORDINATION: Left finger to nose is normal, right finger to nose is normal, No rest tremor; no intention tremor; no postural tremor; no bradykinesia.  REFLEXES: Deep tendon reflexes are symmetrical and normal.   SENSATION:  This seems normal to painful stimuli on both sides.                  GNA  Reviewed pt MRI result on 12/26/17 which showed progression of hyperintensity in the left parietal white matter.  Hyperintensity on diffusion is unchanged,  may be T2 shine through or pseudonormalization.  Differential includes late subacute infarct, demyelinating disease and cerebritis which are other considerations.  Continued MRI follow-up is suggested, preferably with IV contrast.  Atrophy and chronic microvascular ischemia.  No other areas restricted diffusion.  I reviewed again her MRI in 11/2017, the T2 hyperintensity lesion at the left parietal white matter seems more pronounced in the current MRI, however, DWI signal was similar between these 2 exams.   This pattern is really atypical for subacute stroke or acute stroke.  However, concerning for vasculitis or cerebritis.  Patient does have relatively acute onset confusion, altered mental status and onset and at last follow-up.  She has appointment next month with me, we need to repeat MRI with and without contrast, and may have to consider LP or steroids empiric treatment if symptoms continue.  Her Xarelto use may complicate LP procedures.     History obtained mainly from the son.  He stated that patient was admitted in [facility] in November for pneumonia which was treated with prednisone.  During hospitalization, patient started to have confusion but no other focal neurological deficit.  At that time, it was considered as encephalopathy due to double pneumonia.  She was later discharged to rehab for 16 days.  She went home on 11/16/17, but son still noticed some confusion.  For example, not know how to check sugar level at home, not able to use remote control, short memory loss, intermittent word finding difficulties and gramma errors. Episode of trouble walking, anxious and panic attack. It was more pronounced and night with fatigue and less pronounced during the day.  had MRI 11/21/17 with and without contrast showed possible left inferior MCA subacute infarct with mild enhancement, recommend repeat MRI in 4-6 weeks.  MRA head and neck 11/24/17 showed unremarkable except 3 mm paraophthalmic aneurysm.   Blood pressure (!) 161/62, pulse 77, temperature 98 F (36.7 C), temperature source Oral, resp. rate 19, height 5\' 5"  (1.651 m), weight 149 lb 14.6 oz (68 kg), SpO2 95 %.  Past Medical History:  Diagnosis Date  . Adenomatous polyp    h/o  . Asthma   . Chronic nausea   . COPD (chronic obstructive pulmonary disease) ([place])   . Diabetes ([place])   . Disorientation, unspecified   . GERD (gastroesophageal reflux disease)   . Hemorrhoids    intermittently,bleeding  . Hyperlipidemia   .  Hypertension   . Stroke [facility])     Past Surgical History:  Procedure Laterality Date  . BREAST BIOPSY     right,for benign disease  . COLONOSCOPY  07/30/2002   [id number] rectum/Transverse left-sided diverticulum/The remainder of the colonic mucosa appeared normal  . COLONOSCOPY N/A 09/14/2014   Procedure: COLONOSCOPY;  Surgeon: Daneil Dolin, MD;  Location: [facility];  Service: Endoscopy;  Laterality: N/A;  8:30 AM - moved to 9:30 - Ginger to notify pt  . TONSILLECTOMY      Family History  Problem Relation Age of Onset  . COPD Father   . Heart failure Mother   . Cirrhosis Brother        alcohol related cirrhosis  . Lung cancer Brother   . Colon cancer Neg Hx     Social History:  reports that she has never smoked. She has never used smokeless tobacco. She reports that she does not drink alcohol or use drugs.  Allergies:  Allergies  Allergen Reactions  . Aspirin     Due to asthma On  mar   . Fosamax [Alendronate Sodium]   . Levaquin [Levofloxacin]   . Lipitor [Atorvastatin]   . Lisinopril   . Reglan [Metoclopramide] Other (See Comments)    Panic attacks    Medications: Prior to Admission medications   Medication Sig Start Date End Date Taking? Authorizing Provider  acetaminophen (TYLENOL) 500 MG tablet Take 1,000 mg by mouth every 6 (six) hours as needed for mild pain or headache.   Yes [provider]  ALPRAZolam (XANAX) 0.5 MG tablet Take 1 tablet (0.5 mg total) by mouth 2 (two) times daily as needed for anxiety. 02/19/18  Yes Melvenia Beam, MD  budesonide-formoterol [facility]) 160-4.5 MCG/ACT inhaler Inhale 2 puffs into the lungs 2 (two) times daily. 02/11/18  Yes Juanito Doom, MD  Calcium Carbonate-Vitamin D (CALTRATE 600+D) 600-400 MG-UNIT per tablet Take 1 tablet by mouth 2 (two) times daily.   Yes [provider]  diltiazem (CARDIZEM CD) 180 MG 24 hr capsule Take 1 capsule (180 mg total) by mouth daily. 01/19/18  Yes Herminio Commons, MD  donepezil (ARICEPT) 10 MG tablet Take 1 tablet (10 mg total) by mouth at bedtime. 01/10/18  Yes Rosalin Hawking, MD  ergocalciferol (VITAMIN D2) 50000 UNITS capsule Take 50,000 Units by mouth every 14 (fourteen) days.    Yes [provider]  glipiZIDE (GLUCOTROL) 5 MG tablet Take 1 tablet (5 mg total) by mouth daily before breakfast. 12/04/17  Yes Tat, Shanon Brow, MD  guaiFENesin-dextromethorphan (ROBITUSSIN DM) 100-10 MG/5ML syrup Take 5 mLs by mouth 2 (two) times daily as needed for cough.   Yes [provider]  LANTUS SOLOSTAR 100 UNIT/ML Solostar Pen Inject 12 Units into the skin daily.  02/11/18  Yes [provider]  loratadine (CLARITIN) 10 MG tablet Take 10 mg by mouth daily.   Yes [provider]  metFORMIN (GLUCOPHAGE) 500 MG tablet Take 1,000 mg by mouth 2 (two) times daily with a meal.    Yes [provider]  metoprolol tartrate (LOPRESSOR) 25 MG tablet Take 1 tablet (25 mg total) by mouth 2 (two) times daily. 12/04/17  Yes Tat, Shanon Brow, MD  ondansetron (ZOFRAN) 4 MG tablet Take 4 mg by mouth every 8 (eight) hours as needed for nausea or vomiting.   Yes [provider]  pantoprazole (PROTONIX) 40 MG tablet Take 40 mg by mouth daily.  07/06/13  Yes [provider]  QUEtiapine (SEROQUEL) 25 MG tablet Take 0.5 tablets (12.5 mg total) by mouth at bedtime. 02/16/18  Yes Rosalin Hawking, MD  ranitidine (ZANTAC) 300 MG tablet Take 300 mg by mouth at bedtime.   Yes [provider]  Rivaroxaban (XARELTO) 15 MG TABS tablet Take 1 tablet (15 mg total) by mouth daily with supper. 12/04/17  Yes Tat, Shanon Brow, MD  rosuvastatin (CRESTOR) 5 MG tablet Take 2.5 mg by mouth daily.     Yes [provider]  traZODone (DESYREL) 50 MG tablet Take 50 mg by mouth at bedtime.  02/09/18  Yes [provider]  valsartan-hydrochlorothiazide (DIOVAN-HCT) 320-12.5 MG tablet Take 1 tablet by mouth daily.   Yes [provider]  VENTOLIN HFA  108 (90 Base) MCG/ACT inhaler Inhale 1 puff into the lungs 3 (three) times daily. 11/27/17  Yes [provider]    Scheduled Meds: .  stroke: mapping our early stages of recovery book   Does not apply Once  . aspirin  300 mg Rectal Daily   Or  . aspirin  325 mg Oral  Daily  . calcium-vitamin D  1 tablet Oral BID  . diltiazem  180 mg Oral Daily  . donepezil  10 mg Oral QHS  . famotidine  20 mg Oral QHS  . insulin aspart  0-5 Units Subcutaneous QHS  . insulin aspart  0-9 Units Subcutaneous TID WC  . insulin glargine  10 Units Subcutaneous QHS  . loratadine  10 mg Oral Daily  . mouth rinse  15 mL Mouth Rinse BID  . mometasone-formoterol  2 puff Inhalation BID  . omega-3 acid ethyl esters  1,000 mg Oral Daily  . pantoprazole  40 mg Oral Daily  . QUEtiapine  12.5 mg Oral QHS  . rivaroxaban  15 mg Oral Daily  . rosuvastatin  2.5 mg Oral q1800  . sodium chloride flush  3 mL Intravenous Q12H  . traZODone  50 mg Oral QHS   Continuous Infusions: . sodium chloride     PRN Meds:.sodium chloride, acetaminophen **OR** acetaminophen (TYLENOL) oral liquid 160 mg/5 mL **OR** acetaminophen, albuterol, ALPRAZolam, guaiFENesin-dextromethorphan, ondansetron (ZOFRAN) IV, senna-docusate, sodium chloride flush     Results for orders placed or performed during the hospital encounter of 02/20/18 (from the past 48 hour(s))  MRSA PCR Screening     Status: None   Collection Time: 02/21/18 12:23 PM  Result Value Ref Range   MRSA by PCR NEGATIVE NEGATIVE    Comment:        The GeneXpert MRSA Assay (FDA approved for NASAL specimens only), is one component of a comprehensive MRSA colonization surveillance program. It is not intended to diagnose MRSA infection nor to guide or monitor treatment for MRSA infections. Performed at [facility], [address] [place], [place] [address]   Glucose, capillary     Status: Abnormal   Collection Time: 02/21/18  5:19 PM  Result Value Ref Range    Glucose-Capillary 172 (H) 65 - 99 mg/dL  Glucose, capillary     Status: Abnormal   Collection Time: 02/21/18  9:45 PM  Result Value Ref Range   Glucose-Capillary 145 (H) 65 - 99 mg/dL  Glucose, capillary     Status: None   Collection Time: 02/22/18  8:19 AM  Result Value Ref Range   Glucose-Capillary 85 65 - 99 mg/dL  Glucose, capillary     Status: Abnormal   Collection Time: 02/22/18 12:36 PM  Result Value Ref Range   Glucose-Capillary 138 (H) 65 - 99 mg/dL  Glucose, capillary     Status: Abnormal   Collection Time: 02/22/18  5:19 PM  Result Value Ref Range   Glucose-Capillary 117 (H) 65 - 99 mg/dL  Glucose, capillary     Status: Abnormal   Collection Time: 02/22/18  9:40 PM  Result Value Ref Range   Glucose-Capillary 190 (H) 65 - 99 mg/dL  Glucose, capillary     Status: Abnormal   Collection Time: 02/23/18  7:44 AM  Result Value Ref Range   Glucose-Capillary 132 (H) 65 - 99 mg/dL    Studies/Results:    BRAIN MRI FINDINGS: The study is degraded by motion, despite efforts to reduce this artifact, including utilization of motion-resistant MR sequences. The findings of the study are interpreted in the context of reduced sensitivity/specificity.  There is hyperintensity on the diffusion-weighted imaging within the left periatrial white matter without corresponding ADC abnormality. There is confluent periventricular white matter hyperintensity bilaterally. No midline shift or other mass effect.  IMPRESSION: 1. Severely motion degraded examination. 2. Within that limitation, there is DWI hyperintensity within  the left periatrial white matter, without corresponding ADC abnormality. This is favored to indicate T2 shine through in the setting of subacute infarction.    The brain MRI scan is review in person compared to scan done January of this year and also to the one done in December 2018. There is increased signal seen on DWI involving the left parietal region. I do  not see where the site has increased and in fact it probably has got smaller compared to the initial scan done in 2018. There is no increased signal changes noted on the ADC scan indicating this is most likely due to T2 shine through. 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     HEAD /NECK CTA: 1. Artifactual reporting of delayed T-max. No significant ischemic tissue is present. 2. No large vessel occlusion. 3. Atherosclerotic changes at the aortic arch, carotid bifurcations, and cavernous internal carotid arteries without significant stenoses relative to the more distal vessel. 4. Moderate left A2 segment stenosis. 5. No significant proximal stenosis, aneurysm, or branch vessel occlusion within the middle cerebral arteries bilaterally. 6. Mild narrowing of the right vertebral artery at the dural margin with normal caliber of the left vertebral artery.    [place] A. Merlene Laughter, M.D.  Diplomate, Tax adviser of Psychiatry and Neurology ( Neurology). 02/23/2018, 8:06 AM

## 2018-02-23 NOTE — Care Management (Signed)
Patient Information   Patient Name Sarah Duarte, Sarah Duarte ([id number]) Sex Female DOB 04/15/1932  Room Bed  IC12 IC12-01  Patient Demographics   Address [place] [address] Phone [phone] [facility]) [phone] (Mobile) *Preferred*  Patient Ethnicity & Race   Ethnic Group Patient Race  Not Hispanic or Latino White or Caucasian  Emergency Contact(s)   Name Relation Home Work Mobile  [place] Son [phone]  [phone]  [place], dean Son [phone]    KEYAUNA, GRAEFE Daughter   [phone]  Documents on File    Status Date Received Description  Documents for the Patient  EMR Medication Summary Not Received    EMR Problem Summary Not Received    EMR Patient Summary Not Received    [place] Not Received    [place] E-Signature HIPAA Notice of Privacy Received 11/15/11   [place] E-Signature HIPAA Notice of Privacy [place] Not Received    Driver's License Received 61/60/73 gna 2019 exp 2020  Insurance Card Received 12/10/17 gna 2019 [place] Directives/Living Will/HCPOA/POA Not Received  [place] for [employer] Death gna 7106  Financial Application Not Received    Insurance Card Received 11/15/11 OLD [place] Not Received    Insurance Card Received 07/14/13 BLUE MEDICARE  HIM ROI Authorization  08/23/14   Other Photo ID Not Received    Insurance Card Received  old card  Insurance Card Received 06/03/17 BCBS Medicare 2018/tg  AMB Correspondence  05/05/17 [facility]  Release of Information Not Received    Advanced Beneficiary Notice (ABN) Not Received    E-Signature AOB Spanish Not Received    Release of Information Received 12/03/17   [facility] E-Signature HIPAA Notice of Privacy Signed 12/10/17 gna 2019  Release of Information Received 12/11/17 [facility] WIDE DPR  Release of Information Received 01/19/18 ALL [facility] DPR   Insurance Card Received 02/11/18 BCBS Medicare 2019 Active  Release of Information Received 02/16/18 DPR [place] E-Signature HIPAA Notice of Privacy Signed 02/16/18   Documents for the Encounter  AOB (Assignment of Insurance Benefits) Not Received    E-signature AOB Unable to Obtain 02/20/18   MEDICARE RIGHTS Not Received    E-signature Medicare Rights Unable to Obtain 02/20/18   Cardiac Monitoring Strip Shift Summary Received 02/20/18   Ultrasound Received 02/22/18   ED Patient Billing Extract   ED PB Billing Extract  Discharge Attachment   Ischemic Stroke Easy-to-Read (English)  Discharge Attachment   Stroke Prevention Easy-to-Read Cleophus Molt)  Admission Information   Attending Provider Admitting Provider Admission Type Admission Date/Time  Isaac Bliss, Rayford Halsted, MD Vianne Bulls, MD Emergency 02/20/18 1744  Discharge Date Hospital Service Auth/Cert Status Service Area   Internal Medicine Incomplete [facility]  Unit Room/Bed Admission Status   AP-ICCUP NURSING IC12/IC12-01 Admission (Confirmed)   Admission   Complaint  Code [facility] Account   Name Acct ID Class Status Primary Coverage  Sarah Duarte, Sarah Duarte [id number] Inpatient [place]      Guarantor Account (for Hospital Account [id number])   Name Relation to [place]? Acct Type  Sarah Duarte Self [facility] Yes Personal/Family  Address Phone    [address] [place],  [address] [phone])        Coverage Information (for Hospital Account [id number])   F/O Payor/Plan Precert #  BLUE CROSS  [facility] SHIELD MEDICARE/BCBS MEDICARE   Subscriber Subscriber #  Sarah Duarte, Sarah Duarte [id number]  Address Phone  [address] [place], [place] [address] [phone]

## 2018-02-23 NOTE — Progress Notes (Signed)
Patient is alert and oriented x3 and sitting up in chair. Nursing swallow evaluation completed. Patient took sips of water without difficulty. Patient took bites of a cracker and seemed to tolerate well. Medications given one by one and patient tolerated well, patient noted to clear her throat after swallowing the last two medications. Patient has been coughing on and off intermittently since 7am and coughed about 2 minutes after swallowing her last medication.  Dr. Jerilee Hoh made aware of results and patient denies having any difficulty swallowing but due to her intermittent clearing of her throat and coughing, speech eval ordered. Marland Kitchen

## 2018-02-23 NOTE — Telephone Encounter (Signed)
Sarah Duarte has has called and left patient a message about Lumbar Puncture.

## 2018-02-23 NOTE — Telephone Encounter (Signed)
Rn call patients son Remo Lipps about his mom being admitted to [facility].RN stated the hospital MD decide if patient needs to be transferred to [facility] if the current admission shows a new stroke. The pt never got the MRI last week because she was claustrophobia.Rn stated DR. Erlinda Hong will be sent this phone message. The son Remo Lipps verbalized understanding.

## 2018-02-23 NOTE — Consult Note (Signed)
Consultation Note Date: 02/23/2018   Patient Name: Sarah Duarte  DOB: 26-Jan-1932  MRN: [id number]  Age / Sex: 84 y.o., female  PCP: Glenda Chroman, MD Referring Physician: Isaac Bliss, Olam Idler*  Reason for Consultation: Establishing goals of care and Psychosocial/spiritual support  HPI/Patient Profile: 84 y.o. female  with past medical history of COPD, diabetes, GERD, high blood pressure and cholesterol, asthma, history of stroke admitted on 02/20/2018 with stroke.   Clinical Assessment and Goals of Care: Sarah Duarte is resting quietly in bed.  She greets me making and keeping eye contact.  She is calm, cooperative, and pleasant.  She is oriented to person, place and time with some coaching, but does seem to be stuck in a loop about what has happened to her.  She states repeatedly that she ate lunch out, came in for a nap, and then was getting up for dinner.  She is unable to go forward from this statement, and continues to state this when asked to tell her story.  We talked about healthcare surrogate's, and she shares that her son Annie Main is her Ambulance person.  Her husband Mateo Flow has passed, although she initially states that he is in [place], her son Marlou Sa is in [place].  Son Richardson Landry states he has noticed changes in short term memory, also speaking in loop. She was seeing neurologist after hospitalization end of November.  We talk about the possible need for higher level of care.  We talk about HCPOA and CODE Status and how to make healthcare choices for her.  I encouraged Richardson Landry to start considering SNF choices, even though she is likely to return to her ALF.  Healthcare power of attorney HCPOA -son, Rhylie Stehr, healthcare power of attorney paperwork noted in document section of epic chart.  Healthcare power of attorney paperwork page 17, desire for natural death page 15.   SUMMARY OF  RECOMMENDATIONS   Continue to treat the treatable. Return to ALF with home health services. Follow-up outpatient neurology.  Code Status/Advance Care Planning:  Full code  Symptom Management:   Per hospitalist, no additional needs at this time.  Palliative Prophylaxis:   Turn Reposition  Additional Recommendations (Limitations, Scope, Preferences):  At this point, full scope treatment.  Psycho-social/Spiritual:   Desire for further Chaplaincy support:no  Additional Recommendations: Caregiving  Support/Resources and Education on Hospice  Prognosis:   Unable to determine, based on outcomes.  Discharge Planning: Likely return to ALF, home health rehab.      Primary Diagnoses: Present on Admission: . Anxiety . Stroke ([place]) . COPD (chronic obstructive pulmonary disease) ([place]) . Essential hypertension . Paroxysmal atrial fibrillation (HCC) . CKD (chronic kidney disease), stage III ([place]) . Acute CVA (cerebrovascular accident) ([place])   I have reviewed the medical record, interviewed the patient and family, and examined the patient. The following aspects are pertinent.  Past Medical History:  Diagnosis Date  . Adenomatous polyp    h/o  . Asthma   . Chronic nausea   . COPD (chronic obstructive pulmonary  disease) ([place])   . Diabetes ([place])   . Disorientation, unspecified   . GERD (gastroesophageal reflux disease)   . Hemorrhoids    intermittently,bleeding  . Hyperlipidemia   . Hypertension   . Stroke [facility])    Social History   Socioeconomic History  . Marital status: Widowed    Spouse name: Not on file  . Number of children: Not on file  . Years of education: Not on file  . Highest education level: Not on file  Occupational History  . Not on file  Social Needs  . Financial resource strain: Not on file  . Food insecurity:    Worry: Not on file    Inability: Not on file  . Transportation needs:    Medical: Not on file    Non-medical: Not on file    Tobacco Use  . Smoking status: Never Smoker  . Smokeless tobacco: Never Used  Substance and Sexual Activity  . Alcohol use: No  . Drug use: No  . Sexual activity: Not on file  Lifestyle  . Physical activity:    Days per week: Not on file    Minutes per session: Not on file  . Stress: Not on file  Relationships  . Social connections:    Talks on phone: Not on file    Gets together: Not on file    Attends religious service: Not on file    Active member of club or organization: Not on file    Attends meetings of clubs or organizations: Not on file    Relationship status: Not on file  Other Topics Concern  . Not on file  Social History Narrative   Has 2 children and has been married for 50 years.    Retired from [employer].   Family History  Problem Relation Age of Onset  . COPD Father   . Heart failure Mother   . Cirrhosis Brother        alcohol related cirrhosis  . Lung cancer Brother   . Colon cancer Neg Hx    Scheduled Meds: .  stroke: mapping our early stages of recovery book   Does not apply Once  . aspirin  300 mg Rectal Daily   Or  . aspirin  325 mg Oral Daily  . calcium-vitamin D  1 tablet Oral BID  . diltiazem  180 mg Oral Daily  . donepezil  10 mg Oral QHS  . famotidine  20 mg Oral QHS  . insulin aspart  0-5 Units Subcutaneous QHS  . insulin aspart  0-9 Units Subcutaneous TID WC  . insulin glargine  10 Units Subcutaneous QHS  . loratadine  10 mg Oral Daily  . LORazepam  1 mg Intravenous Once  . mouth rinse  15 mL Mouth Rinse BID  . mometasone-formoterol  2 puff Inhalation BID  . omega-3 acid ethyl esters  1,000 mg Oral Daily  . pantoprazole  40 mg Oral Daily  . QUEtiapine  12.5 mg Oral QHS  . rivaroxaban  15 mg Oral Daily  . rosuvastatin  2.5 mg Oral q1800  . sodium chloride flush  3 mL Intravenous Q12H  . traZODone  50 mg Oral QHS   Continuous Infusions: . sodium chloride     PRN Meds:.sodium chloride, acetaminophen **OR** acetaminophen (TYLENOL)  oral liquid 160 mg/5 mL **OR** acetaminophen, albuterol, ALPRAZolam, guaiFENesin-dextromethorphan, ondansetron (ZOFRAN) IV, senna-docusate, sodium chloride flush Medications Prior to Admission:  Prior to Admission medications   Medication Sig Start Date End Date  Taking? Authorizing Provider  acetaminophen (TYLENOL) 500 MG tablet Take 1,000 mg by mouth every 6 (six) hours as needed for mild pain or headache.   Yes [provider]  ALPRAZolam (XANAX) 0.5 MG tablet Take 1 tablet (0.5 mg total) by mouth 2 (two) times daily as needed for anxiety. 02/19/18  Yes Melvenia Beam, MD  budesonide-formoterol [facility]) 160-4.5 MCG/ACT inhaler Inhale 2 puffs into the lungs 2 (two) times daily. 02/11/18  Yes Juanito Doom, MD  Calcium Carbonate-Vitamin D (CALTRATE 600+D) 600-400 MG-UNIT per tablet Take 1 tablet by mouth 2 (two) times daily.   Yes [provider]  diltiazem (CARDIZEM CD) 180 MG 24 hr capsule Take 1 capsule (180 mg total) by mouth daily. 01/19/18  Yes Herminio Commons, MD  donepezil (ARICEPT) 10 MG tablet Take 1 tablet (10 mg total) by mouth at bedtime. 01/10/18  Yes Rosalin Hawking, MD  ergocalciferol (VITAMIN D2) 50000 UNITS capsule Take 50,000 Units by mouth every 14 (fourteen) days.    Yes [provider]  glipiZIDE (GLUCOTROL) 5 MG tablet Take 1 tablet (5 mg total) by mouth daily before breakfast. 12/04/17  Yes Tat, Shanon Brow, MD  guaiFENesin-dextromethorphan (ROBITUSSIN DM) 100-10 MG/5ML syrup Take 5 mLs by mouth 2 (two) times daily as needed for cough.   Yes [provider]  LANTUS SOLOSTAR 100 UNIT/ML Solostar Pen Inject 12 Units into the skin daily.  02/11/18  Yes [provider]  loratadine (CLARITIN) 10 MG tablet Take 10 mg by mouth daily.   Yes [provider]  metFORMIN (GLUCOPHAGE) 500 MG tablet Take 1,000 mg by mouth 2 (two) times daily with a meal.    Yes [provider]  metoprolol tartrate (LOPRESSOR) 25 MG tablet Take 1  tablet (25 mg total) by mouth 2 (two) times daily. 12/04/17  Yes Tat, Shanon Brow, MD  ondansetron (ZOFRAN) 4 MG tablet Take 4 mg by mouth every 8 (eight) hours as needed for nausea or vomiting.   Yes [provider]  pantoprazole (PROTONIX) 40 MG tablet Take 40 mg by mouth daily.  07/06/13  Yes [provider]  QUEtiapine (SEROQUEL) 25 MG tablet Take 0.5 tablets (12.5 mg total) by mouth at bedtime. 02/16/18  Yes Rosalin Hawking, MD  ranitidine (ZANTAC) 300 MG tablet Take 300 mg by mouth at bedtime.   Yes [provider]  Rivaroxaban (XARELTO) 15 MG TABS tablet Take 1 tablet (15 mg total) by mouth daily with supper. 12/04/17  Yes Tat, Shanon Brow, MD  rosuvastatin (CRESTOR) 5 MG tablet Take 2.5 mg by mouth daily.     Yes [provider]  traZODone (DESYREL) 50 MG tablet Take 50 mg by mouth at bedtime.  02/09/18  Yes [provider]  valsartan-hydrochlorothiazide (DIOVAN-HCT) 320-12.5 MG tablet Take 1 tablet by mouth daily.   Yes [provider]  VENTOLIN HFA 108 (90 Base) MCG/ACT inhaler Inhale 1 puff into the lungs 3 (three) times daily. 11/27/17  Yes [provider]   Allergies  Allergen Reactions  . Aspirin     Due to asthma On mar   . Fosamax [Alendronate Sodium]   . Levaquin [Levofloxacin]   . Lipitor [Atorvastatin]   . Lisinopril   . Reglan [Metoclopramide] Other (See Comments)    Panic attacks   Review of Systems  Unable to perform ROS: Mental status change    Physical Exam  Constitutional: She is oriented to person, place, and time. No distress.  Appears chronically ill, makes and keeps eye contact  HENT:  Head: Atraumatic.  Cardiovascular: Normal rate.  Pulmonary/Chest: Effort normal. No respiratory distress.  Abdominal: Soft. She exhibits no distension.  Musculoskeletal: She exhibits no edema.  Neurological: She is alert and oriented to person, place, and time.  Unable to follow train of thought  Skin: Skin is warm and dry.    Psychiatric:  Calm and cooperative, unable to provide linear thought  Nursing note and vitals reviewed.   Vital Signs: BP (!) 161/62   Pulse 77   Temp 98 F (36.7 C) (Oral)   Resp 19   Ht 5\' 5"  (1.651 m)   Wt 68 kg (149 lb 14.6 oz)   SpO2 97%   BMI 24.95 kg/m  Pain Scale: PAINAD       SpO2: SpO2: 97 % O2 Device:SpO2: 97 % O2 Flow Rate: .O2 Flow Rate (L/min): 2 L/min  IO: Intake/output summary:   Intake/Output Summary (Last 24 hours) at 02/23/2018 1059 Last data filed at 02/22/2018 1730 Gross per 24 hour  Intake 440 ml  Output 375 ml  Net 65 ml    LBM: Last BM Date: 02/21/18 Baseline Weight: Weight: 68 kg (150 lb) Most recent weight: Weight: 68 kg (149 lb 14.6 oz)     Palliative Assessment/Data:   Flowsheet Rows     Most Recent Value  Intake Tab  Referral Department  Hospitalist  Unit at Time of Referral  ICU  Palliative Care Primary Diagnosis  Neurology  Date Notified  02/21/18  Palliative Care Type  New Palliative care  Reason for referral  Clarify Goals of Care  Date of Admission  02/20/18  Date first seen by Palliative Care  02/23/18  # of days Palliative referral response time  2 Day(s)  # of days IP prior to Palliative referral  1  Clinical Assessment  Palliative Performance Scale Score  40%  Pain Max last 24 hours  Not able to report  Pain Min Last 24 hours  Not able to report  Dyspnea Max Last 24 Hours  Not able to report  Dyspnea Min Last 24 hours  Not able to report  Psychosocial & Spiritual Assessment  Palliative Care Outcomes  Patient/Family meeting held?  Yes  Who was at the meeting?  patient at bedside, son/HCPOA via phone      Time In: 0920 Time Out: 1040 Time Total: 80 minutes Greater than 50%  of this time was spent counseling and coordinating care related to the above assessment and plan.  Signed by: Drue Novel, NP   Please contact Palliative Medicine Team phone at [phone] for questions and concerns.  For individual  provider: See Shea Evans

## 2018-02-23 NOTE — Progress Notes (Signed)
SLP  Note  Patient Details Name: SANTRICE MUZIO MRN: [id number] DOB: 12-Sep-1932   Note:        Received phone call about swallow evaluation, however no swallow evaluation ordered. SLP has order for cognitive linguistic evaluation only. Recommend RN to administer RN swallow screen and if Pt fails RN swallow screen, order BSE. SLP will be in later this afternoon for needs.  Thank you,  Genene Churn, [place]    [place] 02/23/2018, 10:11 AM

## 2018-02-23 NOTE — Telephone Encounter (Signed)
Patient's daughter-in-law calling stating patient had a stroke last Friday and is at [facility]. She had 2 MRI's at the hospital and will have a swallowing test today. She says stroke did affect her memory. Does the patient need to be transferred to [facility]. Please call and discuss.

## 2018-02-24 ENCOUNTER — Inpatient Hospital Stay (HOSPITAL_COMMUNITY): Payer: Medicare Other

## 2018-02-24 DIAGNOSIS — I63012 Cerebral infarction due to thrombosis of left vertebral artery: Secondary | ICD-10-CM

## 2018-02-24 DIAGNOSIS — R569 Unspecified convulsions: Secondary | ICD-10-CM

## 2018-02-24 LAB — SEDIMENTATION RATE: SED RATE: 24 mm/h (ref 0–40)

## 2018-02-24 LAB — GLUCOSE, CAPILLARY
GLUCOSE-CAPILLARY: 392 mg/dL — AB (ref 65–99)
Glucose-Capillary: 174 mg/dL — ABNORMAL HIGH (ref 65–99)

## 2018-02-24 LAB — THYROGLOBULIN ANTIBODY

## 2018-02-24 LAB — C-REACTIVE PROTEIN: CRP: 1.5 mg/L (ref 0.0–4.9)

## 2018-02-24 LAB — RPR: RPR Ser Ql: NONREACTIVE

## 2018-02-24 LAB — THYROID PEROXIDASE ANTIBODY: THYROID PEROXIDASE ANTIBODY: 36 [IU]/mL — AB (ref 0–34)

## 2018-02-24 LAB — ANA

## 2018-02-24 LAB — HOMOCYSTEINE: Homocysteine: 16.7 umol/L — ABNORMAL HIGH (ref 0.0–15.0)

## 2018-02-24 MED ORDER — METOPROLOL TARTRATE 25 MG PO TABS
25.0000 mg | ORAL_TABLET | Freq: Two times a day (BID) | ORAL | Status: DC
  Administered 2018-02-24: 25 mg via ORAL
  Filled 2018-02-24: qty 1

## 2018-02-24 MED ORDER — LEVETIRACETAM 250 MG PO TABS: 250.0000 mg | ORAL_TABLET | Freq: Two times a day (BID) | ORAL | Status: DC

## 2018-02-24 NOTE — Telephone Encounter (Signed)
Called and spoke to [place] this Am at [place] she will call Patient's son this am to schedule LP.  [place] Son [phone]  [phone]

## 2018-02-24 NOTE — Progress Notes (Signed)
Sarah A. Merlene Laughter, MD     [email or web address]          Sarah Duarte is an 84 y.o. female.   ASSESSMENT/PLAN:  1. This appears to be case of her recurring episodes of confusion and aphasia.  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  He EEG shows abnormal focal slowing and epileptiform discharges. Interestingly on the right side. The patient has been started on Keppra. No additional workup is suggested at this time.  2. Concern for left parietal lesion is most consistent with remote ischemic infarct. No additional workup is recommended at this time.     She reports that she is doing well today and no recurrent spells of confusion are reported.    GENERAL:  This is a pleasant thin female who is in no acute distress.  HEENT:  Neck is supple no trauma appreciated.  ABDOMEN: Soft  EXTREMITIES: No edema   BACK: Normal alignment.  SKIN: Normal by inspection.    MENTAL STATUS:  She is awake and alert and cooperates with evaluation.  She is oriented to location.  She is not oriented to time.  She has significant difficulties with comprehension and perseverates quite a bit.  Naming is impaired on the right bedside testing with the patient missing about half of the objects tested.  CRANIAL NERVES: Pupils are equal, round and reactive to light and accommodation; extraocular movements are full, there is no significant nystagmus; upper and lower facial muscles are normal in strength and symmetric, there is no flattening of the nasolabial folds; tongue is midline; uvula is midline; shoulder elevation is normal.  Visual field deficits are not appreciated.  MOTOR:  Exact strength is limited due to impairment of comprehension and difficulty following commands but she appears to have at least 4/5 strength.  Bulk and tone normal throughout.  COORDINATION: Left finger to nose is normal,  right finger to nose is normal, No rest tremor; no intention tremor; no postural tremor; no bradykinesia.  REFLEXES: Deep tendon reflexes are symmetrical and normal.   SENSATION:  This seems normal to painful stimuli on both sides.                  GNA  Reviewed pt MRI result on 12/26/17 which showed progression of hyperintensity in the left parietal white matter.  Hyperintensity on diffusion is unchanged, may be T2 shine through or pseudonormalization.  Differential includes late subacute infarct, demyelinating disease and cerebritis which are other considerations.  Continued MRI follow-up is suggested, preferably with IV contrast.  Atrophy and chronic microvascular ischemia.  No other areas restricted diffusion.  I reviewed again her MRI in 11/2017, the T2 hyperintensity lesion at the left parietal white matter seems more pronounced in the current MRI, however, DWI signal was similar between these 2 exams.  This pattern is really atypical for subacute stroke or acute stroke.  However, concerning for vasculitis or cerebritis.  Patient does have relatively acute onset confusion, altered mental status and onset and at last follow-up.  She has appointment next month with me, we need to repeat MRI with and without contrast, and may have to consider LP or steroids empiric treatment if symptoms continue.  Her Xarelto use may complicate LP procedures.     History obtained mainly from the son.  He stated that patient was admitted in [facility] in November for pneumonia which was treated with prednisone.  During hospitalization,  patient started to have confusion but no other focal neurological deficit.  At that time, it was considered as encephalopathy due to double pneumonia.  She was later discharged to rehab for 16 days.  She went home on 11/16/17, but son still noticed some confusion.  For example, not know how to check sugar level at home, not able to use remote control, short  memory loss, intermittent word finding difficulties and gramma errors. Episode of trouble walking, anxious and panic attack. It was more pronounced and night with fatigue and less pronounced during the day.  had MRI 11/21/17 with and without contrast showed possible left inferior MCA subacute infarct with mild enhancement, recommend repeat MRI in 4-6 weeks.  MRA head and neck 11/24/17 showed unremarkable except 3 mm paraophthalmic aneurysm.   Blood pressure (!) 150/100, pulse 98, temperature 98 F (36.7 C), temperature source Axillary, resp. rate (!) 6, height 5\' 5"  (1.651 m), weight 144 lb 6.4 oz (65.5 kg), SpO2 92 %.  Past Medical History:  Diagnosis Date  . Adenomatous polyp    h/o  . Asthma   . Chronic nausea   . COPD (chronic obstructive pulmonary disease) ([place])   . Diabetes ([place])   . Disorientation, unspecified   . GERD (gastroesophageal reflux disease)   . Hemorrhoids    intermittently,bleeding  . Hyperlipidemia   . Hypertension   . Stroke [facility])     Past Surgical History:  Procedure Laterality Date  . BREAST BIOPSY     right,for benign disease  . COLONOSCOPY  07/30/2002   RKY:[id number] rectum/Transverse left-sided diverticulum/The remainder of the colonic mucosa appeared normal  . COLONOSCOPY N/A 09/14/2014   Procedure: COLONOSCOPY;  Surgeon: Daneil Dolin, MD;  Location: [facility] SUITE;  Service: Endoscopy;  Laterality: N/A;  8:30 AM - moved to 9:30 - Ginger to notify pt  . TONSILLECTOMY      Family History  Problem Relation Age of Onset  . COPD Father   . Heart failure Mother   . Cirrhosis Brother        alcohol related cirrhosis  . Lung cancer Brother   . Colon cancer Neg Hx     Social History:  reports that she has never smoked. She has never used smokeless tobacco. She reports that she does not drink alcohol or use drugs.  Allergies:  Allergies  Allergen Reactions  . Aspirin     Due to asthma On mar   . Fosamax [Alendronate Sodium]   . Levaquin  [Levofloxacin]   . Lipitor [Atorvastatin]   . Lisinopril   . Reglan [Metoclopramide] Other (See Comments)    Panic attacks    Medications: Prior to Admission medications   Medication Sig Start Date End Date Taking? Authorizing Provider  acetaminophen (TYLENOL) 500 MG tablet Take 1,000 mg by mouth every 6 (six) hours as needed for mild pain or headache.   Yes [provider]  ALPRAZolam (XANAX) 0.5 MG tablet Take 1 tablet (0.5 mg total) by mouth 2 (two) times daily as needed for anxiety. 02/19/18  Yes Melvenia Beam, MD  budesonide-formoterol [facility]) 160-4.5 MCG/ACT inhaler Inhale 2 puffs into the lungs 2 (two) times daily. 02/11/18  Yes Juanito Doom, MD  Calcium Carbonate-Vitamin D (CALTRATE 600+D) 600-400 MG-UNIT per tablet Take 1 tablet by mouth 2 (two) times daily.   Yes [provider]  diltiazem (CARDIZEM CD) 180 MG 24 hr capsule Take 1 capsule (180 mg total) by mouth daily. 01/19/18  Yes Herminio Commons,  MD  donepezil (ARICEPT) 10 MG tablet Take 1 tablet (10 mg total) by mouth at bedtime. 01/10/18  Yes Rosalin Hawking, MD  ergocalciferol (VITAMIN D2) 50000 UNITS capsule Take 50,000 Units by mouth every 14 (fourteen) days.    Yes [provider]  glipiZIDE (GLUCOTROL) 5 MG tablet Take 1 tablet (5 mg total) by mouth daily before breakfast. 12/04/17  Yes Tat, Shanon Brow, MD  guaiFENesin-dextromethorphan (ROBITUSSIN DM) 100-10 MG/5ML syrup Take 5 mLs by mouth 2 (two) times daily as needed for cough.   Yes [provider]  LANTUS SOLOSTAR 100 UNIT/ML Solostar Pen Inject 12 Units into the skin daily.  02/11/18  Yes [provider]  loratadine (CLARITIN) 10 MG tablet Take 10 mg by mouth daily.   Yes [provider]  metFORMIN (GLUCOPHAGE) 500 MG tablet Take 1,000 mg by mouth 2 (two) times daily with a meal.    Yes [provider]  metoprolol tartrate (LOPRESSOR) 25 MG tablet Take 1 tablet (25 mg total) by mouth 2 (two) times daily.  12/04/17  Yes Tat, Shanon Brow, MD  ondansetron (ZOFRAN) 4 MG tablet Take 4 mg by mouth every 8 (eight) hours as needed for nausea or vomiting.   Yes [provider]  pantoprazole (PROTONIX) 40 MG tablet Take 40 mg by mouth daily.  07/06/13  Yes [provider]  QUEtiapine (SEROQUEL) 25 MG tablet Take 0.5 tablets (12.5 mg total) by mouth at bedtime. 02/16/18  Yes Rosalin Hawking, MD  ranitidine (ZANTAC) 300 MG tablet Take 300 mg by mouth at bedtime.   Yes [provider]  Rivaroxaban (XARELTO) 15 MG TABS tablet Take 1 tablet (15 mg total) by mouth daily with supper. 12/04/17  Yes Tat, Shanon Brow, MD  rosuvastatin (CRESTOR) 5 MG tablet Take 2.5 mg by mouth daily.     Yes [provider]  traZODone (DESYREL) 50 MG tablet Take 50 mg by mouth at bedtime.  02/09/18  Yes [provider]  valsartan-hydrochlorothiazide (DIOVAN-HCT) 320-12.5 MG tablet Take 1 tablet by mouth daily.   Yes [provider]  VENTOLIN HFA 108 (90 Base) MCG/ACT inhaler Inhale 1 puff into the lungs 3 (three) times daily. 11/27/17  Yes [provider]    Scheduled Meds: .  stroke: mapping our early stages of recovery book   Does not apply Once  . aspirin  300 mg Rectal Daily   Or  . aspirin  325 mg Oral Daily  . calcium-vitamin D  1 tablet Oral BID  . diltiazem  180 mg Oral Daily  . donepezil  10 mg Oral QHS  . famotidine  20 mg Oral QHS  . insulin aspart  0-5 Units Subcutaneous QHS  . insulin aspart  0-9 Units Subcutaneous TID WC  . insulin glargine  10 Units Subcutaneous QHS  . levETIRAcetam  250 mg Oral BID  . loratadine  10 mg Oral Daily  . LORazepam  1 mg Intravenous Once  . mouth rinse  15 mL Mouth Rinse BID  . mometasone-formoterol  2 puff Inhalation BID  . omega-3 acid ethyl esters  1,000 mg Oral Daily  . pantoprazole  40 mg Oral Daily  . QUEtiapine  12.5 mg Oral QHS  . rivaroxaban  15 mg Oral Daily  . rosuvastatin  2.5 mg Oral q1800  . sodium chloride flush  3 mL  Intravenous Q12H  . traZODone  50 mg Oral QHS   Continuous Infusions: . sodium chloride     PRN Meds:.sodium chloride, acetaminophen **OR** acetaminophen (  TYLENOL) oral liquid 160 mg/5 mL **OR** acetaminophen, albuterol, ALPRAZolam, guaiFENesin-dextromethorphan, ondansetron (ZOFRAN) IV, senna-docusate, sodium chloride flush     Results for orders placed or performed during the hospital encounter of 02/20/18 (from the past 48 hour(s))  Glucose, capillary     Status: Abnormal   Collection Time: 02/22/18 12:36 PM  Result Value Ref Range   Glucose-Capillary 138 (H) 65 - 99 mg/dL  Glucose, capillary     Status: Abnormal   Collection Time: 02/22/18  5:19 PM  Result Value Ref Range   Glucose-Capillary 117 (H) 65 - 99 mg/dL  Glucose, capillary     Status: Abnormal   Collection Time: 02/22/18  9:40 PM  Result Value Ref Range   Glucose-Capillary 190 (H) 65 - 99 mg/dL  Glucose, capillary     Status: Abnormal   Collection Time: 02/23/18  7:44 AM  Result Value Ref Range   Glucose-Capillary 132 (H) 65 - 99 mg/dL  Vitamin B12     Status: None   Collection Time: 02/23/18  8:48 AM  Result Value Ref Range   Vitamin B-12 407 180 - 914 pg/mL    Comment: (NOTE) This assay is not validated for testing neonatal or myeloproliferative syndrome specimens for Vitamin B12 levels. Performed at [facility], [place] [address] [place], [place] [address]   RPR     Status: None   Collection Time: 02/23/18  8:50 AM  Result Value Ref Range   RPR Ser Ql Non Reactive Non Reactive    Comment: (NOTE) Performed At: [facility] [place], [place] [id number] Rush Farmer MD [id number] Performed at [facility], [address] [place], [place] [address]   Glucose, capillary     Status: Abnormal   Collection Time: 02/23/18 11:19 AM  Result Value Ref Range   Glucose-Capillary 155 (H) 65 - 99 mg/dL  Glucose, capillary     Status: Abnormal   Collection Time: 02/23/18  4:50 PM    Result Value Ref Range   Glucose-Capillary 239 (H) 65 - 99 mg/dL  Glucose, capillary     Status: Abnormal   Collection Time: 02/23/18  9:35 PM  Result Value Ref Range   Glucose-Capillary 183 (H) 65 - 99 mg/dL   Comment 1 Notify RN   Glucose, capillary     Status: Abnormal   Collection Time: 02/24/18  7:57 AM  Result Value Ref Range   Glucose-Capillary 174 (H) 65 - 99 mg/dL   Comment 1 Notify RN    Comment 2 Document in Chart     Studies/Results:    BRAIN MRI FINDINGS: The study is degraded by motion, despite efforts to reduce this artifact, including utilization of motion-resistant MR sequences. The findings of the study are interpreted in the context of reduced sensitivity/specificity.  There is hyperintensity on the diffusion-weighted imaging within the left periatrial white matter without corresponding ADC abnormality. There is confluent periventricular white matter hyperintensity bilaterally. No midline shift or other mass effect.  IMPRESSION: 1. Severely motion degraded examination. 2. Within that limitation, there is DWI hyperintensity within the left periatrial white matter, without corresponding ADC abnormality. This is favored to indicate T2 shine through in the setting of subacute infarction.    The brain MRI scan is review in person compared to scan done January of this year and also to the one done in December 2018. There is increased signal seen on DWI involving the left parietal region. I do not see where the site has increased and  in fact it probably has got smaller compared to the initial scan done in 2018. There is no increased signal changes noted on the ADC scan indicating this is most likely due to T2 shine through. 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     HEAD /NECK CTA: 1. Artifactual reporting of delayed T-max. No significant ischemic tissue is present. 2. No large vessel occlusion. 3. Atherosclerotic changes at the aortic  arch, carotid bifurcations, and cavernous internal carotid arteries without significant stenoses relative to the more distal vessel. 4. Moderate left A2 segment stenosis. 5. No significant proximal stenosis, aneurysm, or branch vessel occlusion within the middle cerebral arteries bilaterally. 6. Mild narrowing of the right vertebral artery at the dural margin with normal caliber of the left vertebral artery.   BRAIN MRI W FINDINGS: The study is mildly motion degraded.  No abnormal brain parenchymal or meningeal enhancement is identified. The small foci of cortical and intravascular enhancement in the posterior left cerebral hemisphere on the 11/21/2017 study have resolved. The major dural venous sinuses enhance normally. No intracranial mass lesion is identified.  IMPRESSION: No abnormal intracranial enhancement. Enhancement on the 11/21/2017 MRI has resolved.         Jadarian Mckay A. Merlene Duarte, M.D.  Diplomate, Tax adviser of Psychiatry and Neurology ( Neurology). 02/24/2018, 8:42 AM

## 2018-02-24 NOTE — NC FL2 (Signed)
[place] MEDICAID FL2 LEVEL OF CARE SCREENING TOOL     IDENTIFICATION  Patient Name: Sarah Duarte Birthdate: 1932/09/19 Sex: female Admission Date (Current Location): 02/20/2018  [facility] and [place] Number:  [employer] and Address:  [place] [address], [place]      Provider Number: [id number]  Attending Physician Name and Address:  Isaac Bliss, [place]  Relative Name and Phone Number:  Danaysha Kirn [phone]    Current Level of Care: Hospital Recommended Level of Care: [place] Prior Approval Number:    Date Approved/Denied:   PASRR Number:    Discharge Plan: Other (Comment)([facility])    Current Diagnoses: Patient Active Problem List   Diagnosis Date Noted  . Seizures ([place]) 02/24/2018  . Goals of care, counseling/discussion   . Palliative care by specialist   . DNR (do not resuscitate) discussion   . Acute CVA (cerebrovascular accident) ([place]) 02/22/2018  . CKD (chronic kidney disease), stage III ([place]) 02/20/2018  . Abnormal MRI 02/16/2018  . Cerebritis 02/16/2018  . Transient alteration of awareness 02/16/2018  . Stroke ([place]) 12/11/2017  . Anxiety 12/11/2017  . Panic attack 12/11/2017  . Hypomagnesemia 12/02/2017  . Paroxysmal atrial fibrillation ([place]) 12/01/2017  . COPD (chronic obstructive pulmonary disease) ([place]) 12/01/2017  . Adenomatous polyps 08/18/2014  . Chronic cough 06/02/2012  . Diabetes mellitus ([place]) 08/14/2009  . Essential hypertension 08/14/2009  . VENTRICULAR TACHYCARDIA, PAROXYSMAL 08/14/2009  . GERD 08/14/2009  . PALPITATIONS, HX OF 08/14/2009  . Hyperlipidemia 08/11/2009  . HEMORRHOIDS 08/11/2009    Orientation RESPIRATION BLADDER Height & Weight     Self, Time, Situation, Place  Normal Continent Weight: 144 lb 6.4 oz (65.5 kg) Height:  5\' 5"  (165.1 cm)  BEHAVIORAL SYMPTOMS/MOOD NEUROLOGICAL BOWEL NUTRITION STATUS      Continent Diet(regular heart  healthy diet, thin liquids)  AMBULATORY STATUS COMMUNICATION OF NEEDS Skin   Supervision Verbally Normal                       Personal Care Assistance Level of Assistance  Bathing, Feeding, Dressing Bathing Assistance: Limited assistance Feeding assistance: Independent Dressing Assistance: Limited assistance     Functional Limitations Info  Sight, Hearing, Speech Sight Info: Adequate Hearing Info: Adequate Speech Info: Adequate    SPECIAL CARE FACTORS FREQUENCY  PT (By licensed PT)     PT Frequency: 3 times week              Contractures Contractures Info: Not present    Additional Factors Info  Psychotropic Code Status Info: full Allergies Info: aspirin, fosamax, levaquin, reglan Psychotropic Info: xanax, seroquel         Current Medications (02/24/2018):  This is the current hospital active medication list Current Facility-Administered Medications  Medication Dose Route Frequency Provider Last Rate Last Dose  .  stroke: mapping our early stages of recovery book   Does not apply Once Opyd, Timothy S, MD      . 0.9 %  sodium chloride infusion  250 mL Intravenous PRN Opyd, Ilene Qua, MD      . acetaminophen (TYLENOL) tablet 650 mg  650 mg Oral Q4H PRN Opyd, Ilene Qua, MD   650 mg at 02/22/18 0848   Or  . acetaminophen (TYLENOL) solution 650 mg  650 mg Per Tube Q4H PRN Opyd, Ilene Qua, MD       Or  . acetaminophen (TYLENOL) suppository 650 mg  650 mg  Rectal Q4H PRN Opyd, Ilene Qua, MD      . albuterol (PROVENTIL) (2.5 MG/3ML) 0.083% nebulizer solution 2.5 mg  2.5 mg Nebulization Q4H PRN Opyd, Ilene Qua, MD      . ALPRAZolam Duanne Moron) tablet 0.5 mg  0.5 mg Oral BID PRN Opyd, Ilene Qua, MD   0.5 mg at 02/23/18 2144  . aspirin suppository 300 mg  300 mg Rectal Daily Opyd, Ilene Qua, MD   Stopped at 02/22/18 1057   Or  . aspirin tablet 325 mg  325 mg Oral Daily Opyd, Ilene Qua, MD   Stopped at 02/23/18 1155  . calcium-vitamin D (OSCAL WITH D) 500-200 MG-UNIT per  tablet 1 tablet  1 tablet Oral BID Opyd, Ilene Qua, MD   1 tablet at 02/24/18 1010  . diltiazem (CARDIZEM CD) 24 hr capsule 180 mg  180 mg Oral Daily Opyd, Ilene Qua, MD   180 mg at 02/24/18 1010  . donepezil (ARICEPT) tablet 10 mg  10 mg Oral QHS Opyd, Ilene Qua, MD   10 mg at 02/23/18 2144  . famotidine (PEPCID) tablet 20 mg  20 mg Oral QHS Opyd, Ilene Qua, MD   20 mg at 02/23/18 2144  . guaiFENesin-dextromethorphan (ROBITUSSIN DM) 100-10 MG/5ML syrup 5 mL  5 mL Oral BID PRN Opyd, Ilene Qua, MD   5 mL at 02/22/18 1704  . insulin aspart (novoLOG) injection 0-5 Units  0-5 Units Subcutaneous QHS Opyd, Ilene Qua, MD   2 Units at 02/21/18 0103  . insulin aspart (novoLOG) injection 0-9 Units  0-9 Units Subcutaneous TID WC Opyd, Ilene Qua, MD   9 Units at 02/24/18 1147  . insulin glargine (LANTUS) injection 10 Units  10 Units Subcutaneous QHS Isaac Bliss, Rayford Halsted, MD   10 Units at 02/23/18 2143  . levETIRAcetam (KEPPRA) tablet 250 mg  250 mg Oral BID Phillips Odor, MD   250 mg at 02/24/18 1010  . loratadine (CLARITIN) tablet 10 mg  10 mg Oral Daily Opyd, Ilene Qua, MD   10 mg at 02/24/18 1010  . LORazepam (ATIVAN) injection 1 mg  1 mg Intravenous Once Phillips Odor, MD      . MEDLINE mouth rinse  15 mL Mouth Rinse BID Isaac Bliss, Rayford Halsted, MD   15 mL at 02/24/18 1011  . metoprolol tartrate (LOPRESSOR) tablet 25 mg  25 mg Oral BID Isaac Bliss, Rayford Halsted, MD   25 mg at 02/24/18 1303  . mometasone-formoterol (DULERA) 200-5 MCG/ACT inhaler 2 puff  2 puff Inhalation BID Opyd, Ilene Qua, MD   2 puff at 02/24/18 0909  . omega-3 acid ethyl esters (LOVAZA) capsule 1,000 mg  1,000 mg Oral Daily Opyd, Ilene Qua, MD   1,000 mg at 02/24/18 1010  . ondansetron (ZOFRAN) injection 4 mg  4 mg Intravenous Q6H PRN Opyd, Ilene Qua, MD      . pantoprazole (PROTONIX) EC tablet 40 mg  40 mg Oral Daily Opyd, Ilene Qua, MD   40 mg at 02/24/18 1010  . QUEtiapine (SEROQUEL) tablet 12.5 mg  12.5 mg Oral QHS Vianne Bulls, MD   Stopped at 02/23/18 2145  . Rivaroxaban (XARELTO) tablet 15 mg  15 mg Oral Daily Isaac Bliss, Rayford Halsted, MD   15 mg at 02/23/18 2042  . rosuvastatin (CRESTOR) tablet 2.5 mg  2.5 mg Oral q1800 Opyd, Ilene Qua, MD   2.5 mg at 02/23/18 1706  . senna-docusate (Senokot-S) tablet 1 tablet  1 tablet Oral QHS PRN Opyd,  Ilene Qua, MD      . sodium chloride flush (NS) 0.9 % injection 3 mL  3 mL Intravenous Q12H Opyd, Ilene Qua, MD   3 mL at 02/24/18 1012  . sodium chloride flush (NS) 0.9 % injection 3 mL  3 mL Intravenous PRN Opyd, Ilene Qua, MD      . traZODone (DESYREL) tablet 50 mg  50 mg Oral QHS Opyd, Ilene Qua, MD   50 mg at 02/23/18 2144     Discharge Medications:  Medication List           STOP taking these medications          valsartan-hydrochlorothiazide 320-12.5 MG tablet Commonly known as:  DIOVAN-HCT                   TAKE these medications          acetaminophen 500 MG tablet Commonly known as:  TYLENOL Take 1,000 mg by mouth every 6 (six) hours as needed for mild pain or headache.   budesonide-formoterol 160-4.5 MCG/ACT inhaler Commonly known as:  SYMBICORT Inhale 2 puffs into the lungs 2 (two) times daily.   CALTRATE 600+D 600-400 MG-UNIT tablet Generic drug:  Calcium Carbonate-Vitamin D Take 1 tablet by mouth 2 (two) times daily.   diltiazem 180 MG 24 hr capsule Commonly known as:  CARDIZEM CD Take 1 capsule (180 mg total) by mouth daily.   donepezil 10 MG tablet Commonly known as:  ARICEPT Take 1 tablet (10 mg total) by mouth at bedtime.   ergocalciferol 50000 units capsule Commonly known as:  VITAMIN D2 Take 50,000 Units by mouth every 14 (fourteen) days.   glipiZIDE 5 MG tablet Commonly known as:  GLUCOTROL Take 1 tablet (5 mg total) by mouth daily before breakfast.   guaiFENesin-dextromethorphan 100-10 MG/5ML syrup Commonly known as:  ROBITUSSIN DM Take 5 mLs by mouth 2 (two) times daily as needed for cough.   LANTUS  SOLOSTAR 100 UNIT/ML Solostar Pen Generic drug:  Insulin Glargine Inject 12 Units into the skin daily.   levETIRAcetam 250 MG tablet Commonly known as:  KEPPRA Take 1 tablet (250 mg total) by mouth 2 (two) times daily.   loratadine 10 MG tablet Commonly known as:  CLARITIN Take 10 mg by mouth daily.   metFORMIN 500 MG tablet Commonly known as:  GLUCOPHAGE Take 1,000 mg by mouth 2 (two) times daily with a meal.   metoprolol tartrate 25 MG tablet Commonly known as:  LOPRESSOR Take 1 tablet (25 mg total) by mouth 2 (two) times daily.   ondansetron 4 MG tablet Commonly known as:  ZOFRAN Take 4 mg by mouth every 8 (eight) hours as needed for nausea or vomiting.   pantoprazole 40 MG tablet Commonly known as:  PROTONIX Take 40 mg by mouth daily.   QUEtiapine 25 MG tablet Commonly known as:  SEROQUEL Take 0.5 tablets (12.5 mg total) by mouth at bedtime.   ranitidine 300 MG tablet Commonly known as:  ZANTAC Take 300 mg by mouth at bedtime.   Rivaroxaban 15 MG Tabs tablet Commonly known as:  XARELTO Take 1 tablet (15 mg total) by mouth daily with supper.   rosuvastatin 5 MG tablet Commonly known as:  CRESTOR Take 2.5 mg by mouth daily.   traZODone 50 MG tablet Commonly known as:  DESYREL Take 50 mg by mouth at bedtime.   VENTOLIN HFA 108 (90 Base) MCG/ACT inhaler Generic drug:  albuterol Inhale 1 puff into the lungs 3 (  three) times daily.       Relevant Imaging Results:  Relevant Lab Results:   Additional Information    Shade Flood, LCSW

## 2018-02-24 NOTE — [place] FL2 (Deleted)
[place] MEDICAID FL2 LEVEL OF CARE SCREENING TOOL     IDENTIFICATION  Patient Name: Sarah Duarte Birthdate: 01-05-1932 Sex: female Admission Date (Current Location): 02/20/2018  [facility] and [place] Number:  [employer] and Address:  [facility] [address], [place]      Provider Number: [id number]  Attending Physician Name and Address:  Isaac Bliss, [place]  Relative Name and Phone Number:  Sereena Marando [phone]    Current Level of Care: Hospital Recommended Level of Care: [place] Prior Approval Number:    Date Approved/Denied:   PASRR Number:    Discharge Plan: Other (Comment)([facility])    Current Diagnoses: Patient Active Problem List   Diagnosis Date Noted  . Seizures ([place]) 02/24/2018  . Goals of care, counseling/discussion   . Palliative care by specialist   . DNR (do not resuscitate) discussion   . Acute CVA (cerebrovascular accident) ([place]) 02/22/2018  . CKD (chronic kidney disease), stage III ([place]) 02/20/2018  . Abnormal MRI 02/16/2018  . Cerebritis 02/16/2018  . Transient alteration of awareness 02/16/2018  . Stroke ([place]) 12/11/2017  . Anxiety 12/11/2017  . Panic attack 12/11/2017  . Hypomagnesemia 12/02/2017  . Paroxysmal atrial fibrillation ([place]) 12/01/2017  . COPD (chronic obstructive pulmonary disease) ([place]) 12/01/2017  . Adenomatous polyps 08/18/2014  . Chronic cough 06/02/2012  . Diabetes mellitus ([place]) 08/14/2009  . Essential hypertension 08/14/2009  . VENTRICULAR TACHYCARDIA, PAROXYSMAL 08/14/2009  . GERD 08/14/2009  . PALPITATIONS, HX OF 08/14/2009  . Hyperlipidemia 08/11/2009  . HEMORRHOIDS 08/11/2009    Orientation RESPIRATION BLADDER Height & Weight     Self, Time, Situation, Place  Normal Continent Weight: 144 lb 6.4 oz (65.5 kg) Height:  5\' 5"  (165.1 cm)  BEHAVIORAL SYMPTOMS/MOOD NEUROLOGICAL BOWEL NUTRITION STATUS      Continent    AMBULATORY STATUS  COMMUNICATION OF NEEDS Skin   Supervision Verbally Normal                       Personal Care Assistance Level of Assistance  Bathing, Feeding, Dressing Bathing Assistance: Limited assistance Feeding assistance: Independent Dressing Assistance: Limited assistance     Functional Limitations Info  Sight, Hearing, Speech Sight Info: Adequate Hearing Info: Adequate Speech Info: Adequate    SPECIAL CARE FACTORS FREQUENCY  PT (By licensed PT)     PT Frequency: 3 times week              Contractures Contractures Info: Not present    Additional Factors Info  Psychotropic Code Status Info: full Allergies Info: aspirin, fosamax, levaquin, reglan Psychotropic Info: xanax, seroquel         Current Medications (02/24/2018):  This is the current hospital active medication list Current Facility-Administered Medications  Medication Dose Route Frequency Provider Last Rate Last Dose  .  stroke: mapping our early stages of recovery book   Does not apply Once Opyd, Timothy S, MD      . 0.9 %  sodium chloride infusion  250 mL Intravenous PRN Opyd, Ilene Qua, MD      . acetaminophen (TYLENOL) tablet 650 mg  650 mg Oral Q4H PRN Opyd, Ilene Qua, MD   650 mg at 02/22/18 0848   Or  . acetaminophen (TYLENOL) solution 650 mg  650 mg Per Tube Q4H PRN Opyd, Ilene Qua, MD       Or  . acetaminophen (TYLENOL) suppository 650 mg  650 mg Rectal Q4H PRN Opyd,  Ilene Qua, MD      . albuterol (PROVENTIL) (2.5 MG/3ML) 0.083% nebulizer solution 2.5 mg  2.5 mg Nebulization Q4H PRN Opyd, Ilene Qua, MD      . ALPRAZolam Duanne Moron) tablet 0.5 mg  0.5 mg Oral BID PRN Opyd, Ilene Qua, MD   0.5 mg at 02/23/18 2144  . aspirin suppository 300 mg  300 mg Rectal Daily Opyd, Ilene Qua, MD   Stopped at 02/22/18 1057   Or  . aspirin tablet 325 mg  325 mg Oral Daily Opyd, Ilene Qua, MD   Stopped at 02/23/18 1155  . calcium-vitamin D (OSCAL WITH D) 500-200 MG-UNIT per tablet 1 tablet  1 tablet Oral BID Opyd, Ilene Qua, MD   1 tablet at 02/24/18 1010  . diltiazem (CARDIZEM CD) 24 hr capsule 180 mg  180 mg Oral Daily Opyd, Ilene Qua, MD   180 mg at 02/24/18 1010  . donepezil (ARICEPT) tablet 10 mg  10 mg Oral QHS Opyd, Ilene Qua, MD   10 mg at 02/23/18 2144  . famotidine (PEPCID) tablet 20 mg  20 mg Oral QHS Opyd, Ilene Qua, MD   20 mg at 02/23/18 2144  . guaiFENesin-dextromethorphan (ROBITUSSIN DM) 100-10 MG/5ML syrup 5 mL  5 mL Oral BID PRN Opyd, Ilene Qua, MD   5 mL at 02/22/18 1704  . insulin aspart (novoLOG) injection 0-5 Units  0-5 Units Subcutaneous QHS Opyd, Ilene Qua, MD   2 Units at 02/21/18 0103  . insulin aspart (novoLOG) injection 0-9 Units  0-9 Units Subcutaneous TID WC Opyd, Ilene Qua, MD   9 Units at 02/24/18 1147  . insulin glargine (LANTUS) injection 10 Units  10 Units Subcutaneous QHS Isaac Bliss, Rayford Halsted, MD   10 Units at 02/23/18 2143  . levETIRAcetam (KEPPRA) tablet 250 mg  250 mg Oral BID Phillips Odor, MD   250 mg at 02/24/18 1010  . loratadine (CLARITIN) tablet 10 mg  10 mg Oral Daily Opyd, Ilene Qua, MD   10 mg at 02/24/18 1010  . LORazepam (ATIVAN) injection 1 mg  1 mg Intravenous Once Phillips Odor, MD      . MEDLINE mouth rinse  15 mL Mouth Rinse BID Isaac Bliss, Rayford Halsted, MD   15 mL at 02/24/18 1011  . metoprolol tartrate (LOPRESSOR) tablet 25 mg  25 mg Oral BID Isaac Bliss, Rayford Halsted, MD   25 mg at 02/24/18 1303  . mometasone-formoterol (DULERA) 200-5 MCG/ACT inhaler 2 puff  2 puff Inhalation BID Opyd, Ilene Qua, MD   2 puff at 02/24/18 0909  . omega-3 acid ethyl esters (LOVAZA) capsule 1,000 mg  1,000 mg Oral Daily Opyd, Ilene Qua, MD   1,000 mg at 02/24/18 1010  . ondansetron (ZOFRAN) injection 4 mg  4 mg Intravenous Q6H PRN Opyd, Ilene Qua, MD      . pantoprazole (PROTONIX) EC tablet 40 mg  40 mg Oral Daily Opyd, Ilene Qua, MD   40 mg at 02/24/18 1010  . QUEtiapine (SEROQUEL) tablet 12.5 mg  12.5 mg Oral QHS Vianne Bulls, MD   Stopped at 02/23/18 2145  .  Rivaroxaban (XARELTO) tablet 15 mg  15 mg Oral Daily Isaac Bliss, Rayford Halsted, MD   15 mg at 02/23/18 2042  . rosuvastatin (CRESTOR) tablet 2.5 mg  2.5 mg Oral q1800 Opyd, Ilene Qua, MD   2.5 mg at 02/23/18 1706  . senna-docusate (Senokot-S) tablet 1 tablet  1 tablet Oral QHS PRN Opyd, Ilene Qua, MD      .  sodium chloride flush (NS) 0.9 % injection 3 mL  3 mL Intravenous Q12H Opyd, Ilene Qua, MD   3 mL at 02/24/18 1012  . sodium chloride flush (NS) 0.9 % injection 3 mL  3 mL Intravenous PRN Opyd, Ilene Qua, MD      . traZODone (DESYREL) tablet 50 mg  50 mg Oral QHS Opyd, Ilene Qua, MD   50 mg at 02/23/18 2144     Discharge Medications:  Medication List           STOP taking these medications          valsartan-hydrochlorothiazide 320-12.5 MG tablet Commonly known as:  DIOVAN-HCT                   TAKE these medications          acetaminophen 500 MG tablet Commonly known as:  TYLENOL Take 1,000 mg by mouth every 6 (six) hours as needed for mild pain or headache.   budesonide-formoterol 160-4.5 MCG/ACT inhaler Commonly known as:  SYMBICORT Inhale 2 puffs into the lungs 2 (two) times daily.   CALTRATE 600+D 600-400 MG-UNIT tablet Generic drug:  Calcium Carbonate-Vitamin D Take 1 tablet by mouth 2 (two) times daily.   diltiazem 180 MG 24 hr capsule Commonly known as:  CARDIZEM CD Take 1 capsule (180 mg total) by mouth daily.   donepezil 10 MG tablet Commonly known as:  ARICEPT Take 1 tablet (10 mg total) by mouth at bedtime.   ergocalciferol 50000 units capsule Commonly known as:  VITAMIN D2 Take 50,000 Units by mouth every 14 (fourteen) days.   glipiZIDE 5 MG tablet Commonly known as:  GLUCOTROL Take 1 tablet (5 mg total) by mouth daily before breakfast.   guaiFENesin-dextromethorphan 100-10 MG/5ML syrup Commonly known as:  ROBITUSSIN DM Take 5 mLs by mouth 2 (two) times daily as needed for cough.   LANTUS SOLOSTAR 100 UNIT/ML Solostar Pen Generic  drug:  Insulin Glargine Inject 12 Units into the skin daily.   levETIRAcetam 250 MG tablet Commonly known as:  KEPPRA Take 1 tablet (250 mg total) by mouth 2 (two) times daily.   loratadine 10 MG tablet Commonly known as:  CLARITIN Take 10 mg by mouth daily.   metFORMIN 500 MG tablet Commonly known as:  GLUCOPHAGE Take 1,000 mg by mouth 2 (two) times daily with a meal.   metoprolol tartrate 25 MG tablet Commonly known as:  LOPRESSOR Take 1 tablet (25 mg total) by mouth 2 (two) times daily.   ondansetron 4 MG tablet Commonly known as:  ZOFRAN Take 4 mg by mouth every 8 (eight) hours as needed for nausea or vomiting.   pantoprazole 40 MG tablet Commonly known as:  PROTONIX Take 40 mg by mouth daily.   QUEtiapine 25 MG tablet Commonly known as:  SEROQUEL Take 0.5 tablets (12.5 mg total) by mouth at bedtime.   ranitidine 300 MG tablet Commonly known as:  ZANTAC Take 300 mg by mouth at bedtime.   Rivaroxaban 15 MG Tabs tablet Commonly known as:  XARELTO Take 1 tablet (15 mg total) by mouth daily with supper.   rosuvastatin 5 MG tablet Commonly known as:  CRESTOR Take 2.5 mg by mouth daily.   traZODone 50 MG tablet Commonly known as:  DESYREL Take 50 mg by mouth at bedtime.   VENTOLIN HFA 108 (90 Base) MCG/ACT inhaler Generic drug:  albuterol Inhale 1 puff into the lungs 3 (three) times daily.  Relevant Imaging Results:  Relevant Lab Results:   Additional Information    Shade Flood, LCSW

## 2018-02-24 NOTE — Progress Notes (Signed)
Patient alert and oriented x4. No complaints of pain, shortness of breath, chest pain, dizziness, nausea or vomiting. Patient up out of bed with stand by assist to the chair and commode. Patient tolerated medications, breakfast and lunch well, Appetite good. Richardson Landry, patient's son, here to pick up patient for discharge. Discharge instructions gone over with patient and patient's son. Both expressed full understanding of instructions and upcoming appointments. Nurse to nurse report called to [facility] in [place] and report given to Seth Bake (resident care coordinator). Patient discharged back to [facility] in [place] with all belongings including discharge instructions, FL2 copy, glasses and cane. Son is transporting patient back to the facility.

## 2018-02-24 NOTE — Discharge Summary (Signed)
Physician Discharge Summary  Sarah Duarte:[id number] DOB: 02/09/1932 DOA: 02/20/2018  PCP: Glenda Chroman, MD  Admit date: 02/20/2018 Discharge date: 02/24/2018  Time spent: 45 minutes  Recommendations for Outpatient Follow-up:  -To be discharged back to ALF today. -Continue Keppra for seizure activity. Home health therapies to be arranged.  Discharge Diagnoses:  Principal Problem:   Stroke [facility]) Active Problems:   Diabetes mellitus ([place])   Essential hypertension   Paroxysmal atrial fibrillation (HCC)   COPD (chronic obstructive pulmonary disease) (HCC)   Anxiety   CKD (chronic kidney disease), stage III (HCC)   Acute CVA (cerebrovascular accident) ([place])   Goals of care, counseling/discussion   Palliative care by specialist   DNR (do not resuscitate) discussion   Seizures ([place])   Discharge Condition: Stable and improved  Filed Weights   02/22/18 0500 02/23/18 0500 02/24/18 0500  Weight: 67.7 kg (149 lb 4 oz) 68 kg (149 lb 14.6 oz) 65.5 kg (144 lb 6.4 oz)    History of present illness:  As per Dr. Myna Hidalgo on 3/22: Sarah Duarte is a 84 y.o. female with medical history significant for insulin-dependent diabetes mellitus, hypertension, COPD, chronic kidney disease  stage III, and paroxysmal atrial fibrillation, now presenting from her SNF for evaluation of acute onset expressive aphasia and increased confusion.  Patient was reportedly in her usual state at approximately 4 PM, but noted to have difficulty with her speech and increased confusion excellently 15 minutes later.  She was transported to the ED for evaluation of this.  The patient does not have any complaints.  There was no recent fall or trauma reported.  She has been following with neurology as an outpatient for possible stroke.  ED Course: Upon arrival to the ED, patient is found to be afebrile, saturating adequately on room air, and with vitals otherwise normal.  EKG features atrial fibrillation with RBBB  and LP FB.  Chemistry panel is notable for a creatinine of 1.28, consistent with her baseline.  CBC is unremarkable.  Troponin is negative and urinalysis notable for an elevated specific gravity.  Chest x-ray features cardiomegaly and pulmonary vascular congestion.  Noncontrast head CT and CTA head and neck is negative for LVO or significant ischemia.  MRI brain is motion degraded and notable for possible subacute infarct.  Tele-neurologist recommended medical admission with inpatient neurology consultation, hold Xarelto, start aspirin, and further evaluation of suspected stroke.  Patient will be admitted for ongoing evaluation and management of this.    Hospital Course:   Acute/subacute infarction in the left white matter -Seen by tele-neurology, decision was made to not give TPA as she is chronically on Xarelto. -Continue aspirin  -Echo: Ejection fraction of 60-65% with normal left ventricular diastolic function parameters, no wall motion abnormalities, no PFO was identified. -Dopplers: Less than 50% bilateral ICA stenosis, antegrade vertebral flow. -PT recommends SNF. -ST: regular diet with thin liquids. -LDL is 70, continue rosuvastatin.  Seizures -EEG should epileptiform discharges. -Has been started on Keppra.  Atrial fibrillation -Rate controlled, currently in sinus rhythm. - Xarelto has been resumed.  Stage III chronic kidney disease -Creatinine remains at baseline of around 1.2-1.4  Insulin-dependent diabetes -CBGs are much better controlled with increase Lantus to 10 units ordered on 3/23. -Continue current management.  Hypertension -BP slightly elevated in the 875-643 range systolic, antihypertensive agents had been held in face of acute CVA in order to allow for permissive hypertension, but have now been restarted.  Dementia -Stable, no  behavioral disturbances. -Continue Aricept, Seroquel  COPD -Stable, continue as needed nebs and long-acting bronchodilator as  well as inhaled corticosteroids.       Procedures:  As above   Consultations:  Neurology, Dr. Merlene Laughter  Discharge Instructions  Discharge Instructions    Diet - low sodium heart healthy   Complete by:  As directed    Increase activity slowly   Complete by:  As directed      Allergies as of 02/24/2018      Reactions   Aspirin    Due to asthma On mar   Fosamax [alendronate Sodium]    Levaquin [levofloxacin]    Lipitor [atorvastatin]    Lisinopril    Reglan [metoclopramide] Other (See Comments)   Panic attacks      Medication List    STOP taking these medications   valsartan-hydrochlorothiazide 320-12.5 MG tablet Commonly known as:  DIOVAN-HCT     TAKE these medications   acetaminophen 500 MG tablet Commonly known as:  TYLENOL Take 1,000 mg by mouth every 6 (six) hours as needed for mild pain or headache.   budesonide-formoterol 160-4.5 MCG/ACT inhaler Commonly known as:  SYMBICORT Inhale 2 puffs into the lungs 2 (two) times daily.   CALTRATE 600+D 600-400 MG-UNIT tablet Generic drug:  Calcium Carbonate-Vitamin D Take 1 tablet by mouth 2 (two) times daily.   diltiazem 180 MG 24 hr capsule Commonly known as:  CARDIZEM CD Take 1 capsule (180 mg total) by mouth daily.   donepezil 10 MG tablet Commonly known as:  ARICEPT Take 1 tablet (10 mg total) by mouth at bedtime.   ergocalciferol 50000 units capsule Commonly known as:  VITAMIN D2 Take 50,000 Units by mouth every 14 (fourteen) days.   glipiZIDE 5 MG tablet Commonly known as:  GLUCOTROL Take 1 tablet (5 mg total) by mouth daily before breakfast.   guaiFENesin-dextromethorphan 100-10 MG/5ML syrup Commonly known as:  ROBITUSSIN DM Take 5 mLs by mouth 2 (two) times daily as needed for cough.   LANTUS SOLOSTAR 100 UNIT/ML Solostar Pen Generic drug:  Insulin Glargine Inject 12 Units into the skin daily.   levETIRAcetam 250 MG tablet Commonly known as:  KEPPRA Take 1 tablet (250 mg total) by  mouth 2 (two) times daily.   loratadine 10 MG tablet Commonly known as:  CLARITIN Take 10 mg by mouth daily.   metFORMIN 500 MG tablet Commonly known as:  GLUCOPHAGE Take 1,000 mg by mouth 2 (two) times daily with a meal.   metoprolol tartrate 25 MG tablet Commonly known as:  LOPRESSOR Take 1 tablet (25 mg total) by mouth 2 (two) times daily.   ondansetron 4 MG tablet Commonly known as:  ZOFRAN Take 4 mg by mouth every 8 (eight) hours as needed for nausea or vomiting.   pantoprazole 40 MG tablet Commonly known as:  PROTONIX Take 40 mg by mouth daily.   QUEtiapine 25 MG tablet Commonly known as:  SEROQUEL Take 0.5 tablets (12.5 mg total) by mouth at bedtime.   ranitidine 300 MG tablet Commonly known as:  ZANTAC Take 300 mg by mouth at bedtime.   Rivaroxaban 15 MG Tabs tablet Commonly known as:  XARELTO Take 1 tablet (15 mg total) by mouth daily with supper.   rosuvastatin 5 MG tablet Commonly known as:  CRESTOR Take 2.5 mg by mouth daily.   traZODone 50 MG tablet Commonly known as:  DESYREL Take 50 mg by mouth at bedtime.   VENTOLIN HFA 108 (90 Base) MCG/ACT  inhaler Generic drug:  albuterol Inhale 1 puff into the lungs 3 (three) times daily.            Durable Medical Equipment  (From admission, onward)        Start     Ordered   02/23/18 1140  For home use only DME Cane  Once     02/23/18 1139     Allergies  Allergen Reactions  . Aspirin     Due to asthma On mar   . Fosamax [Alendronate Sodium]   . Levaquin [Levofloxacin]   . Lipitor [Atorvastatin]   . Lisinopril   . Reglan [Metoclopramide] Other (See Comments)    Panic attacks      The results of significant diagnostics from this hospitalization (including imaging, microbiology, ancillary and laboratory) are listed below for reference.    Significant Diagnostic Studies: Ct Angio Head W Or Wo Contrast  Result Date: 02/20/2018 CLINICAL DATA:  New onset expressive aphasia and confusion.  EXAM: CT ANGIOGRAPHY HEAD AND NECK CT PERFUSION BRAIN TECHNIQUE: Multidetector CT imaging of the head and neck was performed using the standard protocol during bolus administration of intravenous contrast. Multiplanar CT image reconstructions and MIPs were obtained to evaluate the vascular anatomy. Carotid stenosis measurements (when applicable) are obtained utilizing NASCET criteria, using the distal internal carotid diameter as the denominator. Multiphase CT imaging of the brain was performed following IV bolus contrast injection. Subsequent parametric perfusion maps were calculated using RAPID software. CONTRAST:  11mL ISOVUE-370 IOPAMIDOL (ISOVUE-370) INJECTION 76% COMPARISON:  CT head without contrast 02/20/2018. MRI brain 12/26/2017. FINDINGS: CTA NECK FINDINGS Aortic arch: A 3 vessel arch configuration is present. Atherosclerotic calcifications are present without significant stenosis or aneurysm. Right carotid system: The right common carotid artery is within normal limits. Atherosclerotic changes are present at the bifurcation without a significant stenosis relative to the more distal vessel. Left carotid system: The left common carotid artery is within normal limits. Minimal mural calcification is present distally and at the left carotid bifurcation without a significant stenosis relative to the more distal vessel. Vertebral arteries: Vertebral arteries originate from the subclavian arteries bilaterally without a significant stenosis. The vertebral arteries are codominant. There is no significant stenosis of either vertebral artery in the neck. Skeleton: Multilevel endplate degenerative changes are present. Osseous foraminal narrowing is present on the right at C3-4 and bilaterally at C4-5. Left foraminal narrowing is present at C5-6. Other neck: No significant cervical adenopathy is present. Salivary glands are within normal limits. Thyroid is mildly heterogeneous without a dominant lesion. Upper chest:  Multiple paratracheal lymph nodes are present. The largest node is a pretracheal node measuring 17 mm. No focal lung nodule or mass is present. Mild dependent atelectasis is evident. Coronary artery calcifications are noted. Review of the MIP images confirms the above findings CTA HEAD FINDINGS Anterior circulation: Atherosclerotic calcifications are present in the cavernous internal carotid arteries bilaterally without stenosis through the ICA termini. The A1 and M1 segments are normal. Anterior communicating artery is patent. Moderate left M2 segment stenosis is present. MCA bifurcations are intact. No significant proximal branch vessel stenosis or occlusion is present. Posterior circulation: The right vertebral artery demonstrates moderate stenosis at the dural margin proximal to the PICA. The left vertebral artery becomes dominant above the dura. Basilar artery is normal. Posterior cerebral arteries originate from basilar tip. PCA branch vessels are within normal limits. Venous sinuses: Dural sinuses are patent. The right transverse sinus is dominant. Cortical veins are within normal limits.  Anatomic variants: None Delayed phase: The postcontrast images demonstrate no pathologic enhancement. Review of the MIP images confirms the above findings CT Brain Perfusion Findings: CBF (<30%) Volume: 26mL Perfusion (Tmax>6.0s) volume: 85mL is reported by the rapid software. This is felt to be artifactual due to patient angulation and movement. Mismatch Volume: Not applicable Infarction Location:Not applicable IMPRESSION: 1. Artifactual reporting of delayed T-max. No significant ischemic tissue is present. 2. No large vessel occlusion. 3. Atherosclerotic changes at the aortic arch, carotid bifurcations, and cavernous internal carotid arteries without significant stenoses relative to the more distal vessel. 4. Moderate left A2 segment stenosis. 5. No significant proximal stenosis, aneurysm, or branch vessel occlusion within  the middle cerebral arteries bilaterally. 6. Mild narrowing of the right vertebral artery at the dural margin with normal caliber of the left vertebral artery. These results were called by telephone at the time of interpretation on 02/20/2018 at 6:30 pm to Dr. Francine Graven , who verbally acknowledged these results. Electronically Signed   By: San Morelle M.D.   On: 02/20/2018 18:42   Dg Chest 1 View  Result Date: 02/20/2018 CLINICAL DATA:  Possible stroke. History of asthma and hypertension. EXAM: CHEST  1 VIEW COMPARISON:  12/09/2017 FINDINGS: Lungs are adequately inflated with no focal lobar consolidation or effusion. There is prominence of the perihilar markings suggesting mild vascular congestion/edema. Mild cardiomegaly. Calcified plaque over the aortic arch. Mild degenerate change of the spine. IMPRESSION: Mild cardiomegaly with minimal vascular congestion. Electronically Signed   By: Marin Olp M.D.   On: 02/20/2018 18:56   Ct Angio Neck W Or Wo Contrast  Result Date: 02/20/2018 CLINICAL DATA:  New onset expressive aphasia and confusion. EXAM: CT ANGIOGRAPHY HEAD AND NECK CT PERFUSION BRAIN TECHNIQUE: Multidetector CT imaging of the head and neck was performed using the standard protocol during bolus administration of intravenous contrast. Multiplanar CT image reconstructions and MIPs were obtained to evaluate the vascular anatomy. Carotid stenosis measurements (when applicable) are obtained utilizing NASCET criteria, using the distal internal carotid diameter as the denominator. Multiphase CT imaging of the brain was performed following IV bolus contrast injection. Subsequent parametric perfusion maps were calculated using RAPID software. CONTRAST:  159mL ISOVUE-370 IOPAMIDOL (ISOVUE-370) INJECTION 76% COMPARISON:  CT head without contrast 02/20/2018. MRI brain 12/26/2017. FINDINGS: CTA NECK FINDINGS Aortic arch: A 3 vessel arch configuration is present. Atherosclerotic calcifications  are present without significant stenosis or aneurysm. Right carotid system: The right common carotid artery is within normal limits. Atherosclerotic changes are present at the bifurcation without a significant stenosis relative to the more distal vessel. Left carotid system: The left common carotid artery is within normal limits. Minimal mural calcification is present distally and at the left carotid bifurcation without a significant stenosis relative to the more distal vessel. Vertebral arteries: Vertebral arteries originate from the subclavian arteries bilaterally without a significant stenosis. The vertebral arteries are codominant. There is no significant stenosis of either vertebral artery in the neck. Skeleton: Multilevel endplate degenerative changes are present. Osseous foraminal narrowing is present on the right at C3-4 and bilaterally at C4-5. Left foraminal narrowing is present at C5-6. Other neck: No significant cervical adenopathy is present. Salivary glands are within normal limits. Thyroid is mildly heterogeneous without a dominant lesion. Upper chest: Multiple paratracheal lymph nodes are present. The largest node is a pretracheal node measuring 17 mm. No focal lung nodule or mass is present. Mild dependent atelectasis is evident. Coronary artery calcifications are noted. Review of the MIP images  confirms the above findings CTA HEAD FINDINGS Anterior circulation: Atherosclerotic calcifications are present in the cavernous internal carotid arteries bilaterally without stenosis through the ICA termini. The A1 and M1 segments are normal. Anterior communicating artery is patent. Moderate left M2 segment stenosis is present. MCA bifurcations are intact. No significant proximal branch vessel stenosis or occlusion is present. Posterior circulation: The right vertebral artery demonstrates moderate stenosis at the dural margin proximal to the PICA. The left vertebral artery becomes dominant above the dura.  Basilar artery is normal. Posterior cerebral arteries originate from basilar tip. PCA branch vessels are within normal limits. Venous sinuses: Dural sinuses are patent. The right transverse sinus is dominant. Cortical veins are within normal limits. Anatomic variants: None Delayed phase: The postcontrast images demonstrate no pathologic enhancement. Review of the MIP images confirms the above findings CT Brain Perfusion Findings: CBF (<30%) Volume: 28mL Perfusion (Tmax>6.0s) volume: 81mL is reported by the rapid software. This is felt to be artifactual due to patient angulation and movement. Mismatch Volume: Not applicable Infarction Location:Not applicable IMPRESSION: 1. Artifactual reporting of delayed T-max. No significant ischemic tissue is present. 2. No large vessel occlusion. 3. Atherosclerotic changes at the aortic arch, carotid bifurcations, and cavernous internal carotid arteries without significant stenoses relative to the more distal vessel. 4. Moderate left A2 segment stenosis. 5. No significant proximal stenosis, aneurysm, or branch vessel occlusion within the middle cerebral arteries bilaterally. 6. Mild narrowing of the right vertebral artery at the dural margin with normal caliber of the left vertebral artery. These results were called by telephone at the time of interpretation on 02/20/2018 at 6:30 pm to Dr. Francine Graven , who verbally acknowledged these results. Electronically Signed   By: San Morelle M.D.   On: 02/20/2018 18:42   Mr Brain Wo Contrast (neuro Protocol)  Result Date: 02/20/2018 CLINICAL DATA:  Speech difficulty.  Diffusion. EXAM: MRI HEAD WITHOUT CONTRAST TECHNIQUE: Multiplanar, multiecho pulse sequences of the brain and surrounding structures were obtained without intravenous contrast. COMPARISON:  CTA head neck 02/20/2018 FINDINGS: The study is degraded by motion, despite efforts to reduce this artifact, including utilization of motion-resistant MR sequences. The  findings of the study are interpreted in the context of reduced sensitivity/specificity. There is hyperintensity on the diffusion-weighted imaging within the left periatrial white matter without corresponding ADC abnormality. There is confluent periventricular white matter hyperintensity bilaterally. No midline shift or other mass effect. IMPRESSION: 1. Severely motion degraded examination. 2. Within that limitation, there is DWI hyperintensity within the left periatrial white matter, without corresponding ADC abnormality. This is favored to indicate T2 shine through in the setting of subacute infarction. Electronically Signed   By: Ulyses Jarred M.D.   On: 02/20/2018 20:50   Mr Brain W Contrast  Result Date: 02/23/2018 CLINICAL DATA:  Expressive aphasia and confusion. Left temporoparietal signal abnormality on prior MRIs. EXAM: MRI HEAD WITH CONTRAST TECHNIQUE: Multiplanar, multiecho pulse sequences of the brain and surrounding structures were obtained with intravenous contrast. CONTRAST:  47mL MULTIHANCE GADOBENATE DIMEGLUMINE 529 MG/ML IV SOLN COMPARISON:  Brain MRIs 02/20/2018, 12/26/2017, and 11/21/2017 FINDINGS: The study is mildly motion degraded. No abnormal brain parenchymal or meningeal enhancement is identified. The small foci of cortical and intravascular enhancement in the posterior left cerebral hemisphere on the 11/21/2017 study have resolved. The major dural venous sinuses enhance normally. No intracranial mass lesion is identified. IMPRESSION: No abnormal intracranial enhancement. Enhancement on the 11/21/2017 MRI has resolved. Electronically Signed   By: Seymour Bars.D.  On: 02/23/2018 11:27   US Carotid Bilateral  Result Date: 02/22/2018 CLINICAL DATA:  Speech difficulty.  Stroke. EXAM: BILATERAL CAROTID DUPLEX ULTRASOUND TECHNIQUE: Pearline Cables scale imaging, color Doppler and duplex ultrasound were performed of bilateral carotid and vertebral arteries in the neck. COMPARISON:  Brain MR  02/20/2018 FINDINGS: Criteria: Quantification of carotid stenosis is based on velocity parameters that correlate the residual internal carotid diameter with NASCET-based stenosis levels, using the diameter of the distal internal carotid lumen as the denominator for stenosis measurement. The following velocity measurements were obtained: RIGHT ICA:  60 cm/sec CCA:  66 cm/sec SYSTOLIC ICA/CCA RATIO:  1.1 DIASTOLIC ICA/CCA RATIO:  1.8 ECA:  65 cm/sec LEFT ICA:  69 cm/sec CCA:  67 cm/sec SYSTOLIC ICA/CCA RATIO:  1.0 DIASTOLIC ICA/CCA RATIO:  0.9 ECA:  64 cm/sec RIGHT CAROTID ARTERY: Intimal thickening in the internal carotid artery and carotid bulb. External carotid artery is patent with normal waveform. Normal waveforms and velocities in the internal carotid artery. RIGHT VERTEBRAL ARTERY: Antegrade flow and normal waveform in the right vertebral artery. LEFT CAROTID ARTERY: Intimal thickening in the distal common carotid artery. Eccentric echogenic plaque at the left carotid bulb. External carotid artery is patent with normal waveform. Intimal thickening and mild plaque in the proximal internal carotid artery. Normal waveforms and velocities in the internal carotid artery. LEFT VERTEBRAL ARTERY: Antegrade flow and normal waveform in the left vertebral artery. IMPRESSION: Mild atherosclerotic disease in the carotid arteries. Focus of eccentric plaque at the left carotid bulb. Estimated degree of stenosis in the internal carotid arteries is less than 50% bilaterally. Patent vertebral arteries with antegrade flow. Electronically Signed   By: Markus Daft M.D.   On: 02/22/2018 15:34   Ct Cerebral Perfusion W Contrast  Result Date: 02/20/2018 CLINICAL DATA:  New onset expressive aphasia and confusion. EXAM: CT ANGIOGRAPHY HEAD AND NECK CT PERFUSION BRAIN TECHNIQUE: Multidetector CT imaging of the head and neck was performed using the standard protocol during bolus administration of intravenous contrast. Multiplanar CT  image reconstructions and MIPs were obtained to evaluate the vascular anatomy. Carotid stenosis measurements (when applicable) are obtained utilizing NASCET criteria, using the distal internal carotid diameter as the denominator. Multiphase CT imaging of the brain was performed following IV bolus contrast injection. Subsequent parametric perfusion maps were calculated using RAPID software. CONTRAST:  157mL ISOVUE-370 IOPAMIDOL (ISOVUE-370) INJECTION 76% COMPARISON:  CT head without contrast 02/20/2018. MRI brain 12/26/2017. FINDINGS: CTA NECK FINDINGS Aortic arch: A 3 vessel arch configuration is present. Atherosclerotic calcifications are present without significant stenosis or aneurysm. Right carotid system: The right common carotid artery is within normal limits. Atherosclerotic changes are present at the bifurcation without a significant stenosis relative to the more distal vessel. Left carotid system: The left common carotid artery is within normal limits. Minimal mural calcification is present distally and at the left carotid bifurcation without a significant stenosis relative to the more distal vessel. Vertebral arteries: Vertebral arteries originate from the subclavian arteries bilaterally without a significant stenosis. The vertebral arteries are codominant. There is no significant stenosis of either vertebral artery in the neck. Skeleton: Multilevel endplate degenerative changes are present. Osseous foraminal narrowing is present on the right at C3-4 and bilaterally at C4-5. Left foraminal narrowing is present at C5-6. Other neck: No significant cervical adenopathy is present. Salivary glands are within normal limits. Thyroid is mildly heterogeneous without a dominant lesion. Upper chest: Multiple paratracheal lymph nodes are present. The largest node is a pretracheal node measuring 17 mm.  No focal lung nodule or mass is present. Mild dependent atelectasis is evident. Coronary artery calcifications are  noted. Review of the MIP images confirms the above findings CTA HEAD FINDINGS Anterior circulation: Atherosclerotic calcifications are present in the cavernous internal carotid arteries bilaterally without stenosis through the ICA termini. The A1 and M1 segments are normal. Anterior communicating artery is patent. Moderate left M2 segment stenosis is present. MCA bifurcations are intact. No significant proximal branch vessel stenosis or occlusion is present. Posterior circulation: The right vertebral artery demonstrates moderate stenosis at the dural margin proximal to the PICA. The left vertebral artery becomes dominant above the dura. Basilar artery is normal. Posterior cerebral arteries originate from basilar tip. PCA branch vessels are within normal limits. Venous sinuses: Dural sinuses are patent. The right transverse sinus is dominant. Cortical veins are within normal limits. Anatomic variants: None Delayed phase: The postcontrast images demonstrate no pathologic enhancement. Review of the MIP images confirms the above findings CT Brain Perfusion Findings: CBF (<30%) Volume: 24mL Perfusion (Tmax>6.0s) volume: 68mL is reported by the rapid software. This is felt to be artifactual due to patient angulation and movement. Mismatch Volume: Not applicable Infarction Location:Not applicable IMPRESSION: 1. Artifactual reporting of delayed T-max. No significant ischemic tissue is present. 2. No large vessel occlusion. 3. Atherosclerotic changes at the aortic arch, carotid bifurcations, and cavernous internal carotid arteries without significant stenoses relative to the more distal vessel. 4. Moderate left A2 segment stenosis. 5. No significant proximal stenosis, aneurysm, or branch vessel occlusion within the middle cerebral arteries bilaterally. 6. Mild narrowing of the right vertebral artery at the dural margin with normal caliber of the left vertebral artery. These results were called by telephone at the time of  interpretation on 02/20/2018 at 6:30 pm to Dr. Francine Graven , who verbally acknowledged these results. Electronically Signed   By: San Morelle M.D.   On: 02/20/2018 18:42   Dg Chest Port 1v Same Day  Result Date: 02/20/2018 CLINICAL DATA:  Hypoxia after vomiting EXAM: PORTABLE CHEST 1 VIEW COMPARISON:  02/20/2018, 12/09/2017 FINDINGS: Cardiomegaly with vascular congestion. Minimal ground-glass opacity at the bases. Aortic atherosclerosis. No pneumothorax. IMPRESSION: 1. Cardiomegaly with vascular congestion and mild edema 2. Minimal hazy opacity at the bases, possible atelectasis versus minimal aspiration given history. Electronically Signed   By: Donavan Foil M.D.   On: 02/20/2018 20:07   Ct Head Code Stroke Wo Contrast  Result Date: 02/20/2018 CLINICAL DATA:  Code stroke.  Expressive aphasia and confusion EXAM: CT HEAD WITHOUT CONTRAST TECHNIQUE: Contiguous axial images were obtained from the base of the skull through the vertex without intravenous contrast. COMPARISON:  Brain MRI 12/26/2017 FINDINGS: Brain: No mass lesion or acute hemorrhage. No focal hypoattenuation of the basal ganglia or cortex to indicate infarcted tissue. No hydrocephalus or age advanced atrophy. Vascular: No hyperdense vessel. No advanced atherosclerotic calcification of the arteries at the skull base. Skull: Normal visualized skull base, calvarium and extracranial soft tissues. Sinuses/Orbits: No sinus fluid levels or advanced mucosal thickening. No mastoid effusion. Normal orbits. ASPECTS [facility] Stroke Program Early CT Score) - Ganglionic level infarction (caudate, lentiform nuclei, internal capsule, insula, M1-M3 cortex): 7 - Supraganglionic infarction (M4-M6 cortex): 3 Total score (0-10 with 10 being normal): 10 IMPRESSION: 1. Chronic microvascular ischemia without acute abnormality. 2. ASPECTS is 10. These results were called by telephone at the time of interpretation on 02/20/2018 at 6:04 pm to Dr. Rogene Houston ,  who verbally acknowledged these results. Electronically Signed   By: Lennette Bihari  [place] M.D.   On: 02/20/2018 18:05    Microbiology: Recent Results (from the past 240 hour(s))  MRSA PCR Screening     Status: None   Collection Time: 02/21/18 12:23 PM  Result Value Ref Range Status   MRSA by PCR NEGATIVE NEGATIVE Final    Comment:        The GeneXpert MRSA Assay (FDA approved for NASAL specimens only), is one component of a comprehensive MRSA colonization surveillance program. It is not intended to diagnose MRSA infection nor to guide or monitor treatment for MRSA infections. Performed at [facility], [address], [place], [place] [address]      Labs: Basic Metabolic Panel: Recent Labs  Lab 02/20/18 1752 02/20/18 1821  NA 139 139  K 4.3 4.3  CL 101 100*  CO2 23  --   GLUCOSE 152* 147*  BUN 25* 23*  CREATININE 1.28* 1.20*  CALCIUM 10.0  --    Liver Function Tests: Recent Labs  Lab 02/20/18 1752  AST 27  ALT 12*  ALKPHOS 48  BILITOT 0.4  PROT 7.3  ALBUMIN 3.8   No results for input(s): LIPASE, AMYLASE in the last 168 hours. No results for input(s): AMMONIA in the last 168 hours. CBC: Recent Labs  Lab 02/20/18 1752 02/20/18 1821  WBC 8.5  --   NEUTROABS 4.5  --   HGB 12.4 13.3  HCT 39.3 39.0  MCV 79.7  --   PLT 266  --    Cardiac Enzymes: No results for input(s): CKTOTAL, CKMB, CKMBINDEX, TROPONINI in the last 168 hours. BNP: BNP (last 3 results) No results for input(s): BNP in the last 8760 hours.  ProBNP (last 3 results) No results for input(s): PROBNP in the last 8760 hours.  CBG: Recent Labs  Lab 02/23/18 1119 02/23/18 1650 02/23/18 2135 02/24/18 0757 02/24/18 1140  GLUCAP 155* 239* 183* 174* 392*       Signed:  [facility] Pager: [phone] 02/24/2018, 1:00 PM

## 2018-02-24 NOTE — Progress Notes (Signed)
Patient was noted heart rate being between 105 to the 130's. Patient asymptomatic. Notified Dr. Jerilee Hoh. PO dose of metoprolol ordered and given with good effect.

## 2018-02-24 NOTE — Clinical Social Work Note (Signed)
Pt stable for return to her ALF today. Contacted [employer] to update. Faxed [facility]. Contacted pt's son to inform. He will transport. Will update pt's RN. There are no other SW needs for dc.

## 2018-02-24 NOTE — Progress Notes (Signed)
Modified Barium Swallow Progress Note  Patient Details  Name: Sarah Duarte MRN: [id number] Date of Birth: 1932/11/03  Today's Date: 02/24/2018  Modified Barium Swallow completed.  Full report located under Chart Review in the Imaging Section.  Brief recommendations include the following:  Clinical Impression  Pt presents with mild pharyngeal phase dysphagia characterized by min delay in swallow initiation with swallow trigger after liquids reaching pyriforms resulting in flash penetration of thins before/during the swallow which can be a normal age variant. This occurred in a greater amount when Pt drinking with a straw, however no aspiration or significant residuals observed over the course of the study. Pt appeared to have some shading near arytenoids, which was suspicious for penetration/aspiration but careful review of the study showed shading present throughout the examination. The barium tablet was transiently delayed in thoracic esophagus near aortic arch, but eventually cleared with liquid wash. Pt tells SLP that she was vomiting after meals at [facility] a few weeks ago, but has not had that issue during her acute stay at [facility]. SLP observed Pt with lunch tray following today's MBSS and reviewed recommendations with Pt. Recommend regular textures and thin liquids and avoid use of straw, go slowly, and clear throat periodically if using straw. Pt may benefit from 1-2 follow up sessions with SLP at ALF to ensure diet tolerance given reports of previous vomiting.    Swallow Evaluation Recommendations       SLP Diet Recommendations: Regular solids;Thin liquid   Liquid Administration via: Cup   Medication Administration: Whole meds with liquid   Supervision: Patient able to self feed;Intermittent supervision to cue for compensatory strategies   Compensations: Slow rate;Clear throat intermittently(clear throat if using straws)   Postural Changes: Remain semi-upright after  after feeds/meals (Comment);Seated upright at 90 degrees   Oral Care Recommendations: Oral care BID;Patient independent with oral care   Other Recommendations: Clarify dietary restrictions  Thank you,  Genene Churn, [place] 02/24/2018,7:33 PM

## 2018-02-24 NOTE — Care Management Note (Signed)
Case Management Note  Patient Details  Name: Sarah Duarte MRN: [id number] Date of Birth: [birth year]  Subjective/Objective:           Admitted with CVA. From [facility], ind at baseline.          Action/Plan: Returning [facility] today with [place] provides their own PT and pt okay with this. [facility] rep, aware of referral and will pull pt info from chart. Pt needs cane, has no preference of DME provider. [place] given referral and has delivered cane to pt room prior to DC.   Expected Discharge Date:  02/24/18               Expected Discharge Plan:  Assisted Living / Rest Home(with [facility])  In-House Referral:  NA  Discharge planning Services  CM Consult  Post Acute Care Choice:  Home Health Choice offered to:  NA  DME Arranged:   cane DME Agency:   [facility]  HH Arranged:  PT [place] Agency:  [facility]  Status of Service:  Completed, signed off  If discussed at [place] of Stay Meetings, dates discussed:    Additional Comments:  Sherald Barge, RN 02/24/2018, 1:24 PM

## 2018-02-25 ENCOUNTER — Ambulatory Visit (HOSPITAL_COMMUNITY): Payer: Medicare Other

## 2018-02-25 ENCOUNTER — Encounter: Payer: Self-pay | Admitting: Acute Care

## 2018-02-25 ENCOUNTER — Ambulatory Visit: Payer: Medicare Other | Admitting: Acute Care

## 2018-02-25 DIAGNOSIS — J438 Other emphysema: Secondary | ICD-10-CM

## 2018-02-25 MED ORDER — FLUTICASONE PROPIONATE 50 MCG/ACT NA SUSP: 2.0000 | Freq: Every day | NASAL | 2 refills | Status: DC

## 2018-02-25 NOTE — Patient Instructions (Addendum)
Pt. Recommend regular textures and thin liquids and avoid use of straw, go slowly, and clear throat periodically if using straw. Pt may benefit from 1-2 follow up sessions with SLP at ALF to ensure diet tolerance given reports of previous vomiting.   SLP Visit Diagnosis  Please see above recommendations for swallow to prevent aspiration. We will prescribe Flonase 2 squirts up each nostril once daily. Continue Symbicort 2 puffs twice daily Rinse mouth after use We will wait on Pulmonary Function testing as you are better. The Physical Therapy will help with your deconditioning. Follow up with Dr. Lake Bells  Or Sarah in 4 months Please contact office for sooner follow up if symptoms do not improve or worsen or seek emergency care

## 2018-02-25 NOTE — Progress Notes (Signed)
History of Present Illness Sarah Duarte is a 84 y.o. female never smoker referred 01/2018 with cough, dyspnea and  ? Asthma/ allergic rhinitis. She is followed by Dr. Lake Bells.  Synopsis: Referred in March 2019 for cough in the setting of possible asthma and allergic rhinitis  after a  stroke in November or December 2018.  PMH Includes: Insulin dependent DM, HTN,COPD,CKD stage III ( Baseline creatinine 1.2-1.4), PAF (on Xarelto), stroke, and seizures ( Keppra)   02/25/2018  2 week follow up OV for cough and dyspnea Pt. Presents for follow up. She was seen by Dr. Lake Bells 02/03/2018 as consult for cough and dyspnea that had progressively worsened since hospitalization in 10/2017 for pneumonia and stroke ( Diagnosed after discharge after  noting of cognitive decline upon discharge). The discussion and  plan after her consult with Dr. Lake Bells was  as follows:  Discussion: Sarah Duarte comes to clinic today complaining of cough for several months in the setting of some shortness of breath.  This all occurred after a recent hospitalization for pneumonia and a new stroke.  The chest x-ray from January 2019 showed that the pneumonia had cleared up, but she continues to have symptoms of cough when she swallows.  I worry that she may have some dysphasia after the stroke.  Alternatively her symptoms could be caused by worsening asthma control.  I think her cough is also perpetuated by uncontrolled allergic rhinitis.  Of course, the differential diagnosis of dyspnea is broad and if our initial workup today and increased treatment for allergic rhinitis and asthma do not help then she may need to have a more thorough investigation with a CT scan of her chest and  pulmonary function test:  Asthma: We will check a test today called and exhaled nitric oxide test  We will increase the dose of Symbicort to 160/4.52 puffs twice a day Use albuterol 2 puffs every 4 hours as needed for chest tightness wheezing or  shortness of breath  Dysphasia: We will make arrangements for a modified barium swallow test at [facility] to see if you have any trouble swallowing after the stroke  Allergic rhinitis: Take Flonase 2 puffs each nostril daily  Shortness of breath: If you do not see improvement in this with the increased dose of Symbicort in the treatment of your sinus problem then on the next visit we will need to make arrangements for a CT scan of your chest and full pulmonary function test  At follow up today, the patient states she was recently admitted to the hospital for stroke/ seizure. She has been started on Keppra . Notation on her discharge paperwork states her COPD/Asthma was stable. I have asked her how she has been managing with the following interbentions implemented at the last visit:  Increased Symbicort 160/4.5>> She has been compliant and states her breathing is much better  Flonase  Nasal spray>> Pt. Has not started this   The modifies baruim swallow was  Done 02/24/2018>> Mild Aspiration Risk>> Recommendations as below:   Pt. Recommend regular textures and thin liquids and avoid use of straw, go slowly, and clear throat periodically if using straw. Pt may benefit from 1-2 follow up sessions with SLP at ALF to ensure diet tolerance given reports of previous vomiting.    SLP Visit Diagnosis Dysphagia, oropharyngeal phase (R13.12)   She states her breathing is better with the Symbicort. She did not use the Flonase, but will start.  She states we need to prescribe  it so it will be given at the Assisted Living. Cough is essentially gone She denies fever, chest pain, orthopnea or hemoptysis.   Test Results:  CBC Latest Ref Rng & Units 02/20/2018 02/20/2018 02/16/2018  WBC 4.0 - 10.5 K/uL - 8.5 9.3  Hemoglobin 12.0 - 15.0 g/dL 13.3 12.4 12.2  Hematocrit 36.0 - 46.0 % 39.0 39.3 38.6  Platelets 150 - 400 K/uL - 266 261    BMP Latest Ref Rng & Units 02/20/2018 02/20/2018  02/16/2018  Glucose 65 - 99 mg/dL 147(H) 152(H) 123(H)  BUN 6 - 20 mg/dL 23(H) 25(H) 20  Creatinine 0.44 - 1.00 mg/dL 1.20(H) 1.28(H) 1.08(H)  BUN/Creat Ratio 12 - 28 - - 19  Sodium 135 - 145 mmol/L 139 139 143  Potassium 3.5 - 5.1 mmol/L 4.3 4.3 4.6  Chloride 101 - 111 mmol/L 100(L) 101 99  CO2 22 - 32 mmol/L - 23 23  Calcium 8.9 - 10.3 mg/dL - 10.0 10.1    BNP No results found for: BNP  ProBNP No results found for: PROBNP  PFT No results found for: FEV1PRE, FEV1POST, FVCPRE, FVCPOST, TLC, DLCOUNC, PREFEV1FVCRT, PSTFEV1FVCRT  Ct Angio Head W Or Wo Contrast  Result Date: 02/20/2018 CLINICAL DATA:  New onset expressive aphasia and confusion. EXAM: CT ANGIOGRAPHY HEAD AND NECK CT PERFUSION BRAIN TECHNIQUE: Multidetector CT imaging of the head and neck was performed using the standard protocol during bolus administration of intravenous contrast. Multiplanar CT image reconstructions and MIPs were obtained to evaluate the vascular anatomy. Carotid stenosis measurements (when applicable) are obtained utilizing NASCET criteria, using the distal internal carotid diameter as the denominator. Multiphase CT imaging of the brain was performed following IV bolus contrast injection. Subsequent parametric perfusion maps were calculated using RAPID software. CONTRAST:  1101mL ISOVUE-370 IOPAMIDOL (ISOVUE-370) INJECTION 76% COMPARISON:  CT head without contrast 02/20/2018. MRI brain 12/26/2017. FINDINGS: CTA NECK FINDINGS Aortic arch: A 3 vessel arch configuration is present. Atherosclerotic calcifications are present without significant stenosis or aneurysm. Right carotid system: The right common carotid artery is within normal limits. Atherosclerotic changes are present at the bifurcation without a significant stenosis relative to the more distal vessel. Left carotid system: The left common carotid artery is within normal limits. Minimal mural calcification is present distally and at the left carotid  bifurcation without a significant stenosis relative to the more distal vessel. Vertebral arteries: Vertebral arteries originate from the subclavian arteries bilaterally without a significant stenosis. The vertebral arteries are codominant. There is no significant stenosis of either vertebral artery in the neck. Skeleton: Multilevel endplate degenerative changes are present. Osseous foraminal narrowing is present on the right at C3-4 and bilaterally at C4-5. Left foraminal narrowing is present at C5-6. Other neck: No significant cervical adenopathy is present. Salivary glands are within normal limits. Thyroid is mildly heterogeneous without a dominant lesion. Upper chest: Multiple paratracheal lymph nodes are present. The largest node is a pretracheal node measuring 17 mm. No focal lung nodule or mass is present. Mild dependent atelectasis is evident. Coronary artery calcifications are noted. Review of the MIP images confirms the above findings CTA HEAD FINDINGS Anterior circulation: Atherosclerotic calcifications are present in the cavernous internal carotid arteries bilaterally without stenosis through the ICA termini. The A1 and M1 segments are normal. Anterior communicating artery is patent. Moderate left M2 segment stenosis is present. MCA bifurcations are intact. No significant proximal branch vessel stenosis or occlusion is present. Posterior circulation: The right vertebral artery demonstrates moderate stenosis at the dural margin proximal  to the PICA. The left vertebral artery becomes dominant above the dura. Basilar artery is normal. Posterior cerebral arteries originate from basilar tip. PCA branch vessels are within normal limits. Venous sinuses: Dural sinuses are patent. The right transverse sinus is dominant. Cortical veins are within normal limits. Anatomic variants: None Delayed phase: The postcontrast images demonstrate no pathologic enhancement. Review of the MIP images confirms the above findings  CT Brain Perfusion Findings: CBF (<30%) Volume: 75mL Perfusion (Tmax>6.0s) volume: 80mL is reported by the rapid software. This is felt to be artifactual due to patient angulation and movement. Mismatch Volume: Not applicable Infarction Location:Not applicable IMPRESSION: 1. Artifactual reporting of delayed T-max. No significant ischemic tissue is present. 2. No large vessel occlusion. 3. Atherosclerotic changes at the aortic arch, carotid bifurcations, and cavernous internal carotid arteries without significant stenoses relative to the more distal vessel. 4. Moderate left A2 segment stenosis. 5. No significant proximal stenosis, aneurysm, or branch vessel occlusion within the middle cerebral arteries bilaterally. 6. Mild narrowing of the right vertebral artery at the dural margin with normal caliber of the left vertebral artery. These results were called by telephone at the time of interpretation on 02/20/2018 at 6:30 pm to Dr. Francine Graven , who verbally acknowledged these results. Electronically Signed   By: San Morelle M.D.   On: 02/20/2018 18:42   Dg Chest 1 View  Result Date: 02/20/2018 CLINICAL DATA:  Possible stroke. History of asthma and hypertension. EXAM: CHEST  1 VIEW COMPARISON:  12/09/2017 FINDINGS: Lungs are adequately inflated with no focal lobar consolidation or effusion. There is prominence of the perihilar markings suggesting mild vascular congestion/edema. Mild cardiomegaly. Calcified plaque over the aortic arch. Mild degenerate change of the spine. IMPRESSION: Mild cardiomegaly with minimal vascular congestion. Electronically Signed   By: Marin Olp M.D.   On: 02/20/2018 18:56   Ct Angio Neck W Or Wo Contrast  Result Date: 02/20/2018 CLINICAL DATA:  New onset expressive aphasia and confusion. EXAM: CT ANGIOGRAPHY HEAD AND NECK CT PERFUSION BRAIN TECHNIQUE: Multidetector CT imaging of the head and neck was performed using the standard protocol during bolus administration of  intravenous contrast. Multiplanar CT image reconstructions and MIPs were obtained to evaluate the vascular anatomy. Carotid stenosis measurements (when applicable) are obtained utilizing NASCET criteria, using the distal internal carotid diameter as the denominator. Multiphase CT imaging of the brain was performed following IV bolus contrast injection. Subsequent parametric perfusion maps were calculated using RAPID software. CONTRAST:  1108mL ISOVUE-370 IOPAMIDOL (ISOVUE-370) INJECTION 76% COMPARISON:  CT head without contrast 02/20/2018. MRI brain 12/26/2017. FINDINGS: CTA NECK FINDINGS Aortic arch: A 3 vessel arch configuration is present. Atherosclerotic calcifications are present without significant stenosis or aneurysm. Right carotid system: The right common carotid artery is within normal limits. Atherosclerotic changes are present at the bifurcation without a significant stenosis relative to the more distal vessel. Left carotid system: The left common carotid artery is within normal limits. Minimal mural calcification is present distally and at the left carotid bifurcation without a significant stenosis relative to the more distal vessel. Vertebral arteries: Vertebral arteries originate from the subclavian arteries bilaterally without a significant stenosis. The vertebral arteries are codominant. There is no significant stenosis of either vertebral artery in the neck. Skeleton: Multilevel endplate degenerative changes are present. Osseous foraminal narrowing is present on the right at C3-4 and bilaterally at C4-5. Left foraminal narrowing is present at C5-6. Other neck: No significant cervical adenopathy is present. Salivary glands are within normal limits. Thyroid  is mildly heterogeneous without a dominant lesion. Upper chest: Multiple paratracheal lymph nodes are present. The largest node is a pretracheal node measuring 17 mm. No focal lung nodule or mass is present. Mild dependent atelectasis is evident.  Coronary artery calcifications are noted. Review of the MIP images confirms the above findings CTA HEAD FINDINGS Anterior circulation: Atherosclerotic calcifications are present in the cavernous internal carotid arteries bilaterally without stenosis through the ICA termini. The A1 and M1 segments are normal. Anterior communicating artery is patent. Moderate left M2 segment stenosis is present. MCA bifurcations are intact. No significant proximal branch vessel stenosis or occlusion is present. Posterior circulation: The right vertebral artery demonstrates moderate stenosis at the dural margin proximal to the PICA. The left vertebral artery becomes dominant above the dura. Basilar artery is normal. Posterior cerebral arteries originate from basilar tip. PCA branch vessels are within normal limits. Venous sinuses: Dural sinuses are patent. The right transverse sinus is dominant. Cortical veins are within normal limits. Anatomic variants: None Delayed phase: The postcontrast images demonstrate no pathologic enhancement. Review of the MIP images confirms the above findings CT Brain Perfusion Findings: CBF (<30%) Volume: 21mL Perfusion (Tmax>6.0s) volume: 38mL is reported by the rapid software. This is felt to be artifactual due to patient angulation and movement. Mismatch Volume: Not applicable Infarction Location:Not applicable IMPRESSION: 1. Artifactual reporting of delayed T-max. No significant ischemic tissue is present. 2. No large vessel occlusion. 3. Atherosclerotic changes at the aortic arch, carotid bifurcations, and cavernous internal carotid arteries without significant stenoses relative to the more distal vessel. 4. Moderate left A2 segment stenosis. 5. No significant proximal stenosis, aneurysm, or branch vessel occlusion within the middle cerebral arteries bilaterally. 6. Mild narrowing of the right vertebral artery at the dural margin with normal caliber of the left vertebral artery. These results were  called by telephone at the time of interpretation on 02/20/2018 at 6:30 pm to Dr. Francine Graven , who verbally acknowledged these results. Electronically Signed   By: San Morelle M.D.   On: 02/20/2018 18:42   Mr Brain Wo Contrast (neuro Protocol)  Result Date: 02/20/2018 CLINICAL DATA:  Speech difficulty.  Diffusion. EXAM: MRI HEAD WITHOUT CONTRAST TECHNIQUE: Multiplanar, multiecho pulse sequences of the brain and surrounding structures were obtained without intravenous contrast. COMPARISON:  CTA head neck 02/20/2018 FINDINGS: The study is degraded by motion, despite efforts to reduce this artifact, including utilization of motion-resistant MR sequences. The findings of the study are interpreted in the context of reduced sensitivity/specificity. There is hyperintensity on the diffusion-weighted imaging within the left periatrial white matter without corresponding ADC abnormality. There is confluent periventricular white matter hyperintensity bilaterally. No midline shift or other mass effect. IMPRESSION: 1. Severely motion degraded examination. 2. Within that limitation, there is DWI hyperintensity within the left periatrial white matter, without corresponding ADC abnormality. This is favored to indicate T2 shine through in the setting of subacute infarction. Electronically Signed   By: Ulyses Jarred M.D.   On: 02/20/2018 20:50   Mr Brain W Contrast  Result Date: 02/23/2018 CLINICAL DATA:  Expressive aphasia and confusion. Left temporoparietal signal abnormality on prior MRIs. EXAM: MRI HEAD WITH CONTRAST TECHNIQUE: Multiplanar, multiecho pulse sequences of the brain and surrounding structures were obtained with intravenous contrast. CONTRAST:  79mL MULTIHANCE GADOBENATE DIMEGLUMINE 529 MG/ML IV SOLN COMPARISON:  Brain MRIs 02/20/2018, 12/26/2017, and 11/21/2017 FINDINGS: The study is mildly motion degraded. No abnormal brain parenchymal or meningeal enhancement is identified. The small foci of  cortical and  intravascular enhancement in the posterior left cerebral hemisphere on the 11/21/2017 study have resolved. The major dural venous sinuses enhance normally. No intracranial mass lesion is identified. IMPRESSION: No abnormal intracranial enhancement. Enhancement on the 11/21/2017 MRI has resolved. Electronically Signed   By: Logan Bores M.D.   On: 02/23/2018 11:27   US Carotid Bilateral  Result Date: 02/22/2018 CLINICAL DATA:  Speech difficulty.  Stroke. EXAM: BILATERAL CAROTID DUPLEX ULTRASOUND TECHNIQUE: Pearline Cables scale imaging, color Doppler and duplex ultrasound were performed of bilateral carotid and vertebral arteries in the neck. COMPARISON:  Brain MR 02/20/2018 FINDINGS: Criteria: Quantification of carotid stenosis is based on velocity parameters that correlate the residual internal carotid diameter with NASCET-based stenosis levels, using the diameter of the distal internal carotid lumen as the denominator for stenosis measurement. The following velocity measurements were obtained: RIGHT ICA:  60 cm/sec CCA:  66 cm/sec SYSTOLIC ICA/CCA RATIO:  1.1 DIASTOLIC ICA/CCA RATIO:  1.8 ECA:  65 cm/sec LEFT ICA:  69 cm/sec CCA:  67 cm/sec SYSTOLIC ICA/CCA RATIO:  1.0 DIASTOLIC ICA/CCA RATIO:  0.9 ECA:  64 cm/sec RIGHT CAROTID ARTERY: Intimal thickening in the internal carotid artery and carotid bulb. External carotid artery is patent with normal waveform. Normal waveforms and velocities in the internal carotid artery. RIGHT VERTEBRAL ARTERY: Antegrade flow and normal waveform in the right vertebral artery. LEFT CAROTID ARTERY: Intimal thickening in the distal common carotid artery. Eccentric echogenic plaque at the left carotid bulb. External carotid artery is patent with normal waveform. Intimal thickening and mild plaque in the proximal internal carotid artery. Normal waveforms and velocities in the internal carotid artery. LEFT VERTEBRAL ARTERY: Antegrade flow and normal waveform in the left vertebral  artery. IMPRESSION: Mild atherosclerotic disease in the carotid arteries. Focus of eccentric plaque at the left carotid bulb. Estimated degree of stenosis in the internal carotid arteries is less than 50% bilaterally. Patent vertebral arteries with antegrade flow. Electronically Signed   By: Markus Daft M.D.   On: 02/22/2018 15:34   Ct Cerebral Perfusion W Contrast  Result Date: 02/20/2018 CLINICAL DATA:  New onset expressive aphasia and confusion. EXAM: CT ANGIOGRAPHY HEAD AND NECK CT PERFUSION BRAIN TECHNIQUE: Multidetector CT imaging of the head and neck was performed using the standard protocol during bolus administration of intravenous contrast. Multiplanar CT image reconstructions and MIPs were obtained to evaluate the vascular anatomy. Carotid stenosis measurements (when applicable) are obtained utilizing NASCET criteria, using the distal internal carotid diameter as the denominator. Multiphase CT imaging of the brain was performed following IV bolus contrast injection. Subsequent parametric perfusion maps were calculated using RAPID software. CONTRAST:  158mL ISOVUE-370 IOPAMIDOL (ISOVUE-370) INJECTION 76% COMPARISON:  CT head without contrast 02/20/2018. MRI brain 12/26/2017. FINDINGS: CTA NECK FINDINGS Aortic arch: A 3 vessel arch configuration is present. Atherosclerotic calcifications are present without significant stenosis or aneurysm. Right carotid system: The right common carotid artery is within normal limits. Atherosclerotic changes are present at the bifurcation without a significant stenosis relative to the more distal vessel. Left carotid system: The left common carotid artery is within normal limits. Minimal mural calcification is present distally and at the left carotid bifurcation without a significant stenosis relative to the more distal vessel. Vertebral arteries: Vertebral arteries originate from the subclavian arteries bilaterally without a significant stenosis. The vertebral arteries  are codominant. There is no significant stenosis of either vertebral artery in the neck. Skeleton: Multilevel endplate degenerative changes are present. Osseous foraminal narrowing is present on the right at C3-4 and  bilaterally at C4-5. Left foraminal narrowing is present at C5-6. Other neck: No significant cervical adenopathy is present. Salivary glands are within normal limits. Thyroid is mildly heterogeneous without a dominant lesion. Upper chest: Multiple paratracheal lymph nodes are present. The largest node is a pretracheal node measuring 17 mm. No focal lung nodule or mass is present. Mild dependent atelectasis is evident. Coronary artery calcifications are noted. Review of the MIP images confirms the above findings CTA HEAD FINDINGS Anterior circulation: Atherosclerotic calcifications are present in the cavernous internal carotid arteries bilaterally without stenosis through the ICA termini. The A1 and M1 segments are normal. Anterior communicating artery is patent. Moderate left M2 segment stenosis is present. MCA bifurcations are intact. No significant proximal branch vessel stenosis or occlusion is present. Posterior circulation: The right vertebral artery demonstrates moderate stenosis at the dural margin proximal to the PICA. The left vertebral artery becomes dominant above the dura. Basilar artery is normal. Posterior cerebral arteries originate from basilar tip. PCA branch vessels are within normal limits. Venous sinuses: Dural sinuses are patent. The right transverse sinus is dominant. Cortical veins are within normal limits. Anatomic variants: None Delayed phase: The postcontrast images demonstrate no pathologic enhancement. Review of the MIP images confirms the above findings CT Brain Perfusion Findings: CBF (<30%) Volume: 28mL Perfusion (Tmax>6.0s) volume: 33mL is reported by the rapid software. This is felt to be artifactual due to patient angulation and movement. Mismatch Volume: Not applicable  Infarction Location:Not applicable IMPRESSION: 1. Artifactual reporting of delayed T-max. No significant ischemic tissue is present. 2. No large vessel occlusion. 3. Atherosclerotic changes at the aortic arch, carotid bifurcations, and cavernous internal carotid arteries without significant stenoses relative to the more distal vessel. 4. Moderate left A2 segment stenosis. 5. No significant proximal stenosis, aneurysm, or branch vessel occlusion within the middle cerebral arteries bilaterally. 6. Mild narrowing of the right vertebral artery at the dural margin with normal caliber of the left vertebral artery. These results were called by telephone at the time of interpretation on 02/20/2018 at 6:30 pm to Dr. Francine Graven , who verbally acknowledged these results. Electronically Signed   By: San Morelle M.D.   On: 02/20/2018 18:42   Dg Chest Port 1v Same Day  Result Date: 02/20/2018 CLINICAL DATA:  Hypoxia after vomiting EXAM: PORTABLE CHEST 1 VIEW COMPARISON:  02/20/2018, 12/09/2017 FINDINGS: Cardiomegaly with vascular congestion. Minimal ground-glass opacity at the bases. Aortic atherosclerosis. No pneumothorax. IMPRESSION: 1. Cardiomegaly with vascular congestion and mild edema 2. Minimal hazy opacity at the bases, possible atelectasis versus minimal aspiration given history. Electronically Signed   By: Donavan Foil M.D.   On: 02/20/2018 20:07   Dg Swallowing Func-speech Pathology  Result Date: 02/24/2018 Objective Swallowing Evaluation: Type of Study: MBS-Modified Barium Swallow Study  Patient Details Name: SAIA DEROSSETT MRN: [id number] Date of Birth: February 06, 1932 Today's Date: 02/24/2018 Time: SLP Start Time (ACUTE ONLY): 5 -SLP Stop Time (ACUTE ONLY): 1200 SLP Time Calculation (min) (ACUTE ONLY): 30 min Past Medical History: Past Medical History: Diagnosis Date . Adenomatous polyp   h/o . Asthma  . Chronic nausea  . COPD (chronic obstructive pulmonary disease) ([place])  . Diabetes ([place])  .  Disorientation, unspecified  . GERD (gastroesophageal reflux disease)  . Hemorrhoids   intermittently,bleeding . Hyperlipidemia  . Hypertension  . Stroke [facility])  Past Surgical History: Past Surgical History: Procedure Laterality Date . BREAST BIOPSY    right,for benign disease . COLONOSCOPY  07/30/2002  IEP:PIRJJO rectum/Transverse left-sided diverticulum/The remainder of  the colonic mucosa appeared normal . COLONOSCOPY N/A 09/14/2014  Procedure: COLONOSCOPY;  Surgeon: Daneil Dolin, MD;  Location: [facility];  Service: Endoscopy;  Laterality: N/A;  8:30 AM - moved to 9:30 - Ginger to notify pt . TONSILLECTOMY   HPI: SHAWNI VOLKOV is a 84 y.o. female with medical history significant for insulin-dependent diabetes mellitus, hypertension, COPD, chronic kidney disease  stage III, and paroxysmal atrial fibrillation, now presenting from her SNF for evaluation of acute onset expressive aphasia and increased confusion.  Patient was reportedly in her usual state at approximately 4 PM, but noted to have difficulty with her speech and increased confusion excellently 15 minutes later.  She was transported to the ED for evaluation of this.  The patient does not have any complaints.  There was no recent fall or trauma reported.  She has been following with neurology as an outpatient for possible stroke.  Subjective: "I was getting sick after meals a month or so ago." Assessment / Plan / Recommendation CHL IP CLINICAL IMPRESSIONS 02/24/2018 Clinical Impression Pt presents with mild pharyngeal phase dysphagia characterized by min delay in swallow initiation with swallow trigger after liquids reaching pyriforms resulting in flash penetration of thins before/during the swallow which can be a normal age variant. This occurred in a greater amount when Pt drinking with a straw, however no aspiration or significant residuals observed over the course of the study. Pt appeared to have some shading near arytenoids, which was suspicious  for penetration/aspiration but careful review of the study showed shading present throughout the examination. The barium tablet was transiently delayed in thoracic esophagus near aortic arch, but eventually cleared with liquid wash. Pt tells SLP that she was vomiting after meals at [facility] a few weeks ago, but has not had that issue during her acute stay at [facility]. SLP observed Pt with lunch tray following today's MBSS and reviewed recommendations with Pt. Recommend regular textures and thin liquids and avoid use of straw, go slowly, and clear throat periodically if using straw. Pt may benefit from 1-2 follow up sessions with SLP at ALF to ensure diet tolerance given reports of previous vomiting.  SLP Visit Diagnosis Dysphagia, oropharyngeal phase (R13.12) Attention and concentration deficit following -- Frontal lobe and executive function deficit following -- Impact on safety and function Mild aspiration risk   CHL IP TREATMENT RECOMMENDATION 02/24/2018 Treatment Recommendations No treatment recommended at this time   Prognosis 02/24/2018 Prognosis for Safe Diet Advancement Good Barriers to Reach Goals Cognitive deficits Barriers/Prognosis Comment -- CHL IP DIET RECOMMENDATION 02/24/2018 SLP Diet Recommendations Regular solids;Thin liquid Liquid Administration via Cup Medication Administration Whole meds with liquid Compensations Slow rate;Clear throat intermittently Postural Changes Remain semi-upright after after feeds/meals (Comment);Seated upright at 90 degrees   CHL IP OTHER RECOMMENDATIONS 02/24/2018 Recommended Consults -- Oral Care Recommendations Oral care BID;Patient independent with oral care Other Recommendations Clarify dietary restrictions   CHL IP FOLLOW UP RECOMMENDATIONS 02/24/2018 Follow up Recommendations 24 hour supervision/assistance   CHL IP FREQUENCY AND DURATION 02/23/2018 Speech Therapy Frequency (ACUTE ONLY) min 2x/week Treatment Duration 1 week      CHL IP ORAL PHASE 02/24/2018  Oral Phase WFL Oral - Pudding Teaspoon -- Oral - Pudding Cup -- Oral - Honey Teaspoon -- Oral - Honey Cup -- Oral - Nectar Teaspoon -- Oral - Nectar Cup -- Oral - Nectar Straw -- Oral - Thin Teaspoon -- Oral - Thin Cup -- Oral - Thin Straw -- Oral - Puree --  Oral - Mech Soft -- Oral - Regular -- Oral - Multi-Consistency -- Oral - Pill -- Oral Phase - Comment --  CHL IP PHARYNGEAL PHASE 02/24/2018 Pharyngeal Phase Impaired Pharyngeal- Pudding Teaspoon -- Pharyngeal -- Pharyngeal- Pudding Cup -- Pharyngeal -- Pharyngeal- Honey Teaspoon -- Pharyngeal -- Pharyngeal- Honey Cup -- Pharyngeal -- Pharyngeal- Nectar Teaspoon -- Pharyngeal -- Pharyngeal- Nectar Cup Delayed swallow initiation-vallecula;Penetration/Aspiration during swallow Pharyngeal Material does not enter airway;Material enters airway, remains ABOVE vocal cords then ejected out Pharyngeal- Nectar Straw -- Pharyngeal -- Pharyngeal- Thin Teaspoon Delayed swallow initiation-pyriform sinuses;Penetration/Aspiration during swallow Pharyngeal Material enters airway, remains ABOVE vocal cords then ejected out Pharyngeal- Thin Cup Delayed swallow initiation-pyriform sinuses;Penetration/Aspiration before swallow Pharyngeal Material does not enter airway;Material enters airway, remains ABOVE vocal cords then ejected out Pharyngeal- Thin Straw Delayed swallow initiation-pyriform sinuses;Penetration/Aspiration before swallow;Penetration/Aspiration during swallow Pharyngeal Material does not enter airway;Material enters airway, remains ABOVE vocal cords then ejected out Pharyngeal- Puree Delayed swallow initiation-vallecula Pharyngeal -- Pharyngeal- Mechanical Soft -- Pharyngeal -- Pharyngeal- Regular Delayed swallow initiation-vallecula Pharyngeal -- Pharyngeal- Multi-consistency -- Pharyngeal -- Pharyngeal- Pill WFL Pharyngeal -- Pharyngeal Comment min delay in swallow initiation with flash penetration  CHL IP CERVICAL ESOPHAGEAL PHASE 02/24/2018 Cervical Esophageal Phase  Impaired Pudding Teaspoon -- Pudding Cup -- Honey Teaspoon -- Honey Cup -- Nectar Teaspoon -- Nectar Cup -- Nectar Straw -- Thin Teaspoon -- Thin Cup -- Thin Straw -- Puree -- Mechanical Soft -- Regular -- Multi-consistency -- Pill Other (Comment) Cervical Esophageal Comment -- Thank you, Genene Churn, [place] No flowsheet data found. PORTER,DABNEY 02/24/2018, 7:41 PM              Ct Head Code Stroke Wo Contrast  Result Date: 02/20/2018 CLINICAL DATA:  Code stroke.  Expressive aphasia and confusion EXAM: CT HEAD WITHOUT CONTRAST TECHNIQUE: Contiguous axial images were obtained from the base of the skull through the vertex without intravenous contrast. COMPARISON:  Brain MRI 12/26/2017 FINDINGS: Brain: No mass lesion or acute hemorrhage. No focal hypoattenuation of the basal ganglia or cortex to indicate infarcted tissue. No hydrocephalus or age advanced atrophy. Vascular: No hyperdense vessel. No advanced atherosclerotic calcification of the arteries at the skull base. Skull: Normal visualized skull base, calvarium and extracranial soft tissues. Sinuses/Orbits: No sinus fluid levels or advanced mucosal thickening. No mastoid effusion. Normal orbits. ASPECTS [facility] Stroke Program Early CT Score) - Ganglionic level infarction (caudate, lentiform nuclei, internal capsule, insula, M1-M3 cortex): 7 - Supraganglionic infarction (M4-M6 cortex): 3 Total score (0-10 with 10 being normal): 10 IMPRESSION: 1. Chronic microvascular ischemia without acute abnormality. 2. ASPECTS is 10. These results were called by telephone at the time of interpretation on 02/20/2018 at 6:04 pm to Dr. Rogene Houston , who verbally acknowledged these results. Electronically Signed   By: Ulyses Jarred M.D.   On: 02/20/2018 18:05     Past medical hx Past Medical History:  Diagnosis Date  . Adenomatous polyp    h/o  . Asthma   . Chronic nausea   . COPD (chronic obstructive pulmonary disease) ([place])   . Diabetes ([place])   .  Disorientation, unspecified   . GERD (gastroesophageal reflux disease)   . Hemorrhoids    intermittently,bleeding  . Hyperlipidemia   . Hypertension   . Stroke [facility])      Social History   Tobacco Use  . Smoking status: Never Smoker  . Smokeless tobacco: Never Used  Substance Use Topics  . Alcohol use: No  . Drug use: No  Ms.Bundrick reports that she has never smoked. She has never used smokeless tobacco. She reports that she does not drink alcohol or use drugs.  Tobacco Cessation: Never smoker  Past surgical hx, Family hx, Social hx all reviewed.  Current Outpatient Medications on File Prior to Visit  Medication Sig  . acetaminophen (TYLENOL) 500 MG tablet Take 1,000 mg by mouth every 6 (six) hours as needed for mild pain or headache.  . budesonide-formoterol (SYMBICORT) 160-4.5 MCG/ACT inhaler Inhale 2 puffs into the lungs 2 (two) times daily.  . Calcium Carbonate-Vitamin D (CALTRATE 600+D) 600-400 MG-UNIT per tablet Take 1 tablet by mouth 2 (two) times daily.  Marland Kitchen diltiazem (CARDIZEM CD) 180 MG 24 hr capsule Take 1 capsule (180 mg total) by mouth daily.  Marland Kitchen donepezil (ARICEPT) 10 MG tablet Take 1 tablet (10 mg total) by mouth at bedtime.  . ergocalciferol (VITAMIN D2) 50000 UNITS capsule Take 50,000 Units by mouth every 14 (fourteen) days.   Marland Kitchen glipiZIDE (GLUCOTROL) 5 MG tablet Take 1 tablet (5 mg total) by mouth daily before breakfast.  . guaiFENesin-dextromethorphan (ROBITUSSIN DM) 100-10 MG/5ML syrup Take 5 mLs by mouth 2 (two) times daily as needed for cough.  Marland Kitchen LANTUS SOLOSTAR 100 UNIT/ML Solostar Pen Inject 12 Units into the skin daily.   Marland Kitchen levETIRAcetam (KEPPRA) 250 MG tablet Take 1 tablet (250 mg total) by mouth 2 (two) times daily.  Marland Kitchen loratadine (CLARITIN) 10 MG tablet Take 10 mg by mouth daily.  . metFORMIN (GLUCOPHAGE) 500 MG tablet Take 1,000 mg by mouth 2 (two) times daily with a meal.   . metoprolol tartrate (LOPRESSOR) 25 MG tablet Take 1 tablet (25 mg total) by  mouth 2 (two) times daily.  . ondansetron (ZOFRAN) 4 MG tablet Take 4 mg by mouth every 8 (eight) hours as needed for nausea or vomiting.  . pantoprazole (PROTONIX) 40 MG tablet Take 40 mg by mouth daily.   . QUEtiapine (SEROQUEL) 25 MG tablet Take 0.5 tablets (12.5 mg total) by mouth at bedtime.  . ranitidine (ZANTAC) 300 MG tablet Take 300 mg by mouth at bedtime.  . Rivaroxaban (XARELTO) 15 MG TABS tablet Take 1 tablet (15 mg total) by mouth daily with supper.  . rosuvastatin (CRESTOR) 5 MG tablet Take 2.5 mg by mouth daily.    . traZODone (DESYREL) 50 MG tablet Take 50 mg by mouth at bedtime.   . VENTOLIN HFA 108 (90 Base) MCG/ACT inhaler Inhale 1 puff into the lungs 3 (three) times daily.   No current facility-administered medications on file prior to visit.      Allergies  Allergen Reactions  . Aspirin     Due to asthma On mar   . Fosamax [Alendronate Sodium]   . Levaquin [Levofloxacin]   . Lipitor [Atorvastatin]   . Lisinopril   . Reglan [Metoclopramide] Other (See Comments)    Panic attacks    Review Of Systems:  Constitutional:   No  weight loss, night sweats,  Fevers, chills, fatigue, or  lassitude.  HEENT:   No headaches,  Difficulty swallowing,  Tooth/dental problems, or  Sore throat,                No sneezing, itching, ear ache, nasal congestion, + post nasal drip,   CV:  No chest pain,  Orthopnea, PND, swelling in lower extremities, anasarca, dizziness, palpitations, syncope.   GI  No heartburn, indigestion, abdominal pain, nausea, vomiting, diarrhea, change in bowel habits, loss of appetite, bloody stools.   Resp: +  shortness of breath with exertion none at rest.  No excess mucus, no productive cough,  No non-productive cough,  No coughing up of blood.  No change in color of mucus.  No wheezing.  No chest wall deformity  Skin: no rash or lesions.  GU: no dysuria, change in color of urine, no urgency or frequency.  No flank pain, no hematuria   MS:  No joint  pain or swelling.  No decreased range of motion.  No back pain.  Psych:  No change in mood or affect. No depression or anxiety.  No memory loss.   Vital Signs BP 132/80 (BP Location: Left Arm, Cuff Size: Normal)   Pulse 87   Ht 5\' 5"  (1.651 m)   Wt 147 lb (66.7 kg)   SpO2 97%   BMI 24.46 kg/m    Physical Exam:  General- No distress,  A&Ox3, pleasant, poor historian ENT: No sinus tenderness, TM clear, pale nasal mucosa, no oral exudate,no post nasal drip, no LAN, HOH Cardiac: S1, S2, regular rate and rhythm, no murmur Chest: No wheeze/ rales/ dullness; no accessory muscle use, no nasal flaring, no sternal retractions Abd.: Soft Non-tender, ND, Non obese Ext: No clubbing cyanosis, edema Neuro:  Deconditioned at baseline Skin: No rashes, warm and dry Psych: normal mood and behavior   Assessment/Plan  COPD (chronic obstructive pulmonary disease) (HCC) Improved with addition of higher dosing of Symbicort She did not start Flonase Swallow Study>> Mild risk Aspiration> Plan: Avoid use of straws We reviewed the swallow study recommendations She is getting [facility] Start Flonase 2 sprays up each nostril once daily Continue Symbicort 2 puffs twice daily  Rinse after use Since you are better after starting the Symbicort  we will defer on Pulmonary Function Testing Continue Physical Therapy as this will help you get your strength back. This will also help with your shortness of breath. Follow up with Dr. Lake Bells or Judson Roch in 4 months Please contact office for sooner follow up if symptoms do not improve or worsen or seek emergency care    Magdalen Spatz, NP 02/25/2018  10:20 PM

## 2018-02-25 NOTE — Assessment & Plan Note (Addendum)
Improved with addition of higher dosing of Symbicort She did not start Flonase Swallow Study>> Mild risk Aspiration> Plan: Avoid use of straws We reviewed the swallow study recommendations She is getting speech therapy at [facility] Start Flonase 2 sprays up each nostril once daily Continue Symbicort 2 puffs twice daily  Rinse after use Since you are better after starting the Symbicort  we will defer on Pulmonary Function Testing Continue Physical Therapy as this will help you get your strength back. This will also help with your shortness of breath. Follow up with Dr. Lake Bells or Judson Roch in 4 months Please contact office for sooner follow up if symptoms do not improve or worsen or seek emergency care

## 2018-03-03 ENCOUNTER — Telehealth: Payer: Self-pay

## 2018-03-03 NOTE — Addendum Note (Signed)
Addended by: Rosalin Hawking on: 03/03/2018 12:01 PM   Modules accepted: Orders

## 2018-03-03 NOTE — Telephone Encounter (Signed)
LP labs fax to [place] at [phone]. This is per Dr. Erlinda Hong request. Fax receive and confirmed.

## 2018-03-23 ENCOUNTER — Encounter: Payer: Self-pay | Admitting: Internal Medicine

## 2018-03-24 ENCOUNTER — Other Ambulatory Visit: Payer: Medicare Other

## 2018-04-24 ENCOUNTER — Encounter

## 2018-04-24 ENCOUNTER — Ambulatory Visit: Payer: Medicare Other | Admitting: Nurse Practitioner

## 2018-04-24 ENCOUNTER — Telehealth: Payer: Self-pay

## 2018-04-24 ENCOUNTER — Encounter: Payer: Self-pay | Admitting: Nurse Practitioner

## 2018-04-24 VITALS — BP 142/82 | HR 77 | Temp 97.0°F | Ht 65.0 in | Wt 146.0 lb

## 2018-04-24 DIAGNOSIS — R112 Nausea with vomiting, unspecified: Secondary | ICD-10-CM | POA: Diagnosis not present

## 2018-04-24 DIAGNOSIS — K219 Gastro-esophageal reflux disease without esophagitis: Secondary | ICD-10-CM | POA: Diagnosis not present

## 2018-04-24 NOTE — Progress Notes (Signed)
Primary Care Physician:  Glenda Chroman, MD Primary Gastroenterologist:  Dr. Gala Romney  Chief Complaint  Patient presents with  . Nausea    right after lunch, vomits at times  . Diarrhea    after vomiting    HPI:   Sarah Duarte is a 84 y.o. female who presents on referral from primary care for evaluation of nausea and consideration of EGD.  Patient has not been seen in our office since 12/18/2013.  At that time she was seen for adenomatous polyps.  Denied any GI symptoms.  She was subsequently scheduled for colonoscopy.  Colonoscopy was completed 09/14/2014 which found colonic diverticulosis, single colonic polyp status post removal, colonic lipoma.  Surgical pathology found the polyp to be tubular adenoma.  Recommended no further colonoscopy unless symptoms develop.  Reviewed information provided with the referral including PCP office visit dated 03/19/2018.  At that time patient was noted to have dysphagia, nausea.  The patient was noted to be on Protonix and Zantac at that time.  She was subsequently referred to GI.  Today she is accompanied by her son who helps with her history. Today she states she's doing ok overall. Has vomiting (without nausea) postprandially about after every other meal. After vomiting she feels good. This has been occurring for about 6 months. She was in the hospital November 2018 with pneumonia and her current symptoms have been ongoing since then. Denies any dysphagia symptoms or odynophagia. Denies abdominal pain, hematochezia, fever, chills, unintentional weight loss. States she's been losing weight (about 20 lbs since November per her son). Has had some "black looking stools" intermittently. Denies significant diarrhea ("not that much." ) Has had 1-2 episodes of diarrhea when vomiting. Denies chest pain, dyspnea, dizziness, lightheadedness, syncope, near syncope. Denies any other upper or lower GI symptoms.  States GERD symptoms are doing well, no  breakthrough.  Past Medical History:  Diagnosis Date  . Adenomatous polyp    h/o  . Asthma   . Chronic nausea   . COPD (chronic obstructive pulmonary disease) ([place])   . Diabetes ([place])   . Disorientation, unspecified   . GERD (gastroesophageal reflux disease)   . Hemorrhoids    intermittently,bleeding  . Hyperlipidemia   . Hypertension   . Seizures ([place])   . Stroke [facility])     Past Surgical History:  Procedure Laterality Date  . BREAST BIOPSY     right,for benign disease  . COLONOSCOPY  07/30/2002   [id number] rectum/Transverse left-sided diverticulum/The remainder of the colonic mucosa appeared normal  . COLONOSCOPY N/A 09/14/2014   Procedure: COLONOSCOPY;  Surgeon: Daneil Dolin, MD;  Location: [facility] SUITE;  Service: Endoscopy;  Laterality: N/A;  8:30 AM - moved to 9:30 - Ginger to notify pt  . TONSILLECTOMY      Current Outpatient Medications  Medication Sig Dispense Refill  . acetaminophen (TYLENOL) 500 MG tablet Take 1,000 mg by mouth every 6 (six) hours as needed for mild pain or headache.    . ALPRAZolam (XANAX) 0.25 MG tablet Take 0.25 mg by mouth 2 (two) times daily as needed for anxiety.    . budesonide-formoterol (SYMBICORT) 160-4.5 MCG/ACT inhaler Inhale 2 puffs into the lungs 2 (two) times daily. 1 Inhaler 6  . Calcium Carbonate-Vitamin D (CALTRATE 600+D) 600-400 MG-UNIT per tablet Take 1 tablet by mouth 2 (two) times daily.    Marland Kitchen diltiazem (CARDIZEM CD) 180 MG 24 hr capsule Take 1 capsule (180 mg total) by mouth daily. 30 capsule  6  . docusate sodium (COLACE) 100 MG capsule Take 100 mg by mouth 2 (two) times daily.    Marland Kitchen donepezil (ARICEPT) 10 MG tablet Take 1 tablet (10 mg total) by mouth at bedtime. 30 tablet 3  . ergocalciferol (VITAMIN D2) 50000 UNITS capsule Take 50,000 Units by mouth every 14 (fourteen) days.     . fluticasone (FLONASE) 50 MCG/ACT nasal spray Place 2 sprays into both nostrils daily. 16 g 2  . glipiZIDE (GLUCOTROL) 5 MG tablet Take 1 tablet  (5 mg total) by mouth daily before breakfast. 30 tablet 1  . guaiFENesin-dextromethorphan (ROBITUSSIN DM) 100-10 MG/5ML syrup Take 5 mLs by mouth 2 (two) times daily as needed for cough.    Marland Kitchen HYDROcodone-acetaminophen (NORCO/VICODIN) 5-325 MG tablet Take 1 tablet by mouth every 6 (six) hours as needed for moderate pain.    Marland Kitchen LANTUS SOLOSTAR 100 UNIT/ML Solostar Pen Inject 12 Units into the skin daily.   10  . levETIRAcetam (KEPPRA) 250 MG tablet Take 1 tablet (250 mg total) by mouth 2 (two) times daily.    Marland Kitchen loratadine (CLARITIN) 10 MG tablet Take 10 mg by mouth daily.    . metFORMIN (GLUCOPHAGE) 500 MG tablet Take 1,000 mg by mouth 2 (two) times daily with a meal.     . metoprolol tartrate (LOPRESSOR) 25 MG tablet Take 1 tablet (25 mg total) by mouth 2 (two) times daily. 60 tablet 1  . pantoprazole (PROTONIX) 40 MG tablet Take 40 mg by mouth daily.     . promethazine (PHENERGAN) 12.5 MG tablet Take 12.5 mg by mouth every 6 (six) hours as needed for nausea or vomiting.    Marland Kitchen QUEtiapine (SEROQUEL) 25 MG tablet Take 0.5 tablets (12.5 mg total) by mouth at bedtime. 30 tablet 2  . ranitidine (ZANTAC) 300 MG tablet Take 300 mg by mouth at bedtime.    . Rivaroxaban (XARELTO) 15 MG TABS tablet Take 1 tablet (15 mg total) by mouth daily with supper. 30 tablet 0  . rosuvastatin (CRESTOR) 5 MG tablet Take 2.5 mg by mouth daily.      . traZODone (DESYREL) 50 MG tablet Take 50 mg by mouth at bedtime.   10  . VENTOLIN HFA 108 (90 Base) MCG/ACT inhaler Inhale 1 puff into the lungs 3 (three) times daily.  10  . zolpidem (AMBIEN) 5 MG tablet Take 5 mg by mouth at bedtime.     No current facility-administered medications for this visit.     Allergies as of 04/24/2018 - Review Complete 04/24/2018  Allergen Reaction Noted  . Aspirin  06/02/2012  . Fosamax [alendronate sodium]  12/02/2017  . Levaquin [levofloxacin]  12/02/2017  . Lipitor [atorvastatin]  12/02/2017  . Lisinopril  12/02/2017  . Reglan  [metoclopramide] Other (See Comments) 06/02/2012    Family History  Problem Relation Age of Onset  . COPD Father   . Heart failure Mother   . Cirrhosis Brother        alcohol related cirrhosis  . Lung cancer Brother   . Colon cancer Neg Hx     Social History   Socioeconomic History  . Marital status: Widowed    Spouse name: Not on file  . Number of children: Not on file  . Years of education: Not on file  . Highest education level: Not on file  Occupational History  . Not on file  Social Needs  . Financial resource strain: Not on file  . Food insecurity:    Worry: Not  on file    Inability: Not on file  . Transportation needs:    Medical: Not on file    Non-medical: Not on file  Tobacco Use  . Smoking status: Never Smoker  . Smokeless tobacco: Never Used  Substance and Sexual Activity  . Alcohol use: No  . Drug use: No  . Sexual activity: Not on file  Lifestyle  . Physical activity:    Days per week: Not on file    Minutes per session: Not on file  . Stress: Not on file  Relationships  . Social connections:    Talks on phone: Not on file    Gets together: Not on file    Attends religious service: Not on file    Active member of club or organization: Not on file    Attends meetings of clubs or organizations: Not on file    Relationship status: Not on file  . Intimate partner violence:    Fear of current or ex partner: Not on file    Emotionally abused: Not on file    Physically abused: Not on file    Forced sexual activity: Not on file  Other Topics Concern  . Not on file  Social History Narrative   Has 2 children and has been married for 50 years.    Retired from [employer].    Review of Systems: General: Negative for anorexia, weight loss, fever, chills, fatigue, weakness. ENT: Negative for hoarseness, difficulty swallowing. CV: Negative for chest pain, angina, palpitations, peripheral edema.  Respiratory: Negative for dyspnea at rest, cough, sputum,  wheezing.  GI: See history of present illness. Endo: Negative for unusual weight change.  Heme: Negative for bruising or bleeding.    Physical Exam: BP (!) 142/82   Pulse 77   Temp (!) 97 F (36.1 C) (Oral)   Ht 5\' 5"  (1.651 m)   Wt 146 lb (66.2 kg)   BMI 24.30 kg/m  General:   Alert and oriented. Pleasant and cooperative. Well-nourished and well-developed.  Eyes:  Without icterus, sclera clear and conjunctiva pink.  Ears:  Normal auditory acuity. Cardiovascular:  S1, S2 present without murmurs appreciated. Extremities without clubbing or edema. Respiratory:  Clear to auscultation bilaterally. No wheezes, rales, or rhonchi. No distress.  Gastrointestinal:  +BS, soft, non-tender and non-distended. No HSM noted. No guarding or rebound. No masses appreciated.  Rectal:  Deferred  Musculoskalatal:  Symmetrical without gross deformities. Walks with a cane. Neurologic:  Alert and oriented x4;  grossly normal neurologically. Psych:  Alert and cooperative. Normal mood and affect. Heme/Lymph/Immune: No excessive bruising noted.    04/24/2018 10:48 AM   Disclaimer: This note was dictated with voice recognition software. Similar sounding words can inadvertently be transcribed and may not be corrected upon review.

## 2018-04-24 NOTE — Patient Instructions (Signed)
1. We will schedule your upper endoscopy for you. 2. I have recommended that the facility change her diet to mechanical soft, 6 small meals a day. 3. Return for follow-up in 3 months. 4. Further recommendations will be made after your endoscopy. 5. Call us if you have any questions or concerns.  At [facility] we value your feedback. You may receive a survey about your visit today. Please share your experience as we strive to create trusting relationships with our patients to provide genuine, compassionate, quality care.  It was great to meet you today!  I hope you have a wonderful summer!!

## 2018-04-24 NOTE — Telephone Encounter (Signed)
Letter was faxed [phone]) to Dr. Marcial Pacas office asking if it was ok to hold Eliquis x 48 hours prior to EGD. Waiting on a response.

## 2018-04-24 NOTE — Assessment & Plan Note (Signed)
The patient describes an approximate 39-month history of nausea and vomiting.  She was admitted in November 2018 for pneumonia and areas some thought to possible aspiration pneumonia.  She was evaluated by speech-language pathology in the hospital who noted some dysphasia symptoms and hang up of barium pill.  She persists with symptoms which are postprandial and rapid without nausea, occurs about every other meal.  At this point we will set her up for an EGD with possible dilation.  We will need to contact her primary care regarding holding Eliquis for 48 hours prior.  I will also recommend changing her to a soft diet with 6 small meals a day to try to help her symptoms in the meantime.  Return for follow-up in 3 months.

## 2018-04-24 NOTE — Assessment & Plan Note (Signed)
GERD symptoms are doing well on PPI and H2RB.  Denies breakthrough GERD symptoms.  Low suspicion for vomiting related to GERD.  Continue current medications, follow-up as needed.

## 2018-04-28 ENCOUNTER — Telehealth: Payer: Self-pay | Admitting: Internal Medicine

## 2018-04-28 NOTE — Telephone Encounter (Signed)
Awaiting ok from Dr. Woody Seller to hold Eliquis prior to scheduling EGD/-/+DIL w/Propofol w/RMR.

## 2018-04-28 NOTE — Progress Notes (Signed)
CC'D TO PCP °

## 2018-04-28 NOTE — Telephone Encounter (Signed)
Pt was seen on 5/24 and was calling back to schedule her procedure. She is a resident at [facility]. Please call Christy to schedule. [phone]

## 2018-04-29 ENCOUNTER — Ambulatory Visit: Payer: Medicare Other | Admitting: Neurology

## 2018-05-11 ENCOUNTER — Other Ambulatory Visit: Payer: Self-pay

## 2018-05-11 DIAGNOSIS — R112 Nausea with vomiting, unspecified: Secondary | ICD-10-CM

## 2018-05-11 DIAGNOSIS — K219 Gastro-esophageal reflux disease without esophagitis: Secondary | ICD-10-CM

## 2018-05-11 NOTE — Telephone Encounter (Signed)
Called [facility] at [facility]. EGD/-/+Dil w/Propofol w/RMR scheduled for 07/13/18 at 12:15pm. Orders entered.

## 2018-05-11 NOTE — Telephone Encounter (Signed)
Called and informed Sarah Duarte of pre-op appt 07/07/18 at 12:45pm. Appt letter and procedure instructions faxed to [phone]. Hold Eliquis for 48 hours prior to procedure noted on instructions.

## 2018-05-11 NOTE — Telephone Encounter (Signed)
Letter received, ok to stop Eliquis 48 hrs per Dr. Woody Seller.

## 2018-06-17 ENCOUNTER — Ambulatory Visit: Payer: Medicare Other | Admitting: Gastroenterology

## 2018-07-02 NOTE — Patient Instructions (Signed)
Sarah Duarte  07/02/2018     @PREFPERIOPPHARMACY @   Your procedure is scheduled on  07/13/2018   Report to [facility] at  1045  A.M.  Call this number if you have problems the morning of surgery:  [phone]   Remember:  Do not eat or drink after midnight.  You may drink clear liquids until (follow the instructions given to you) .  Clear liquids allowed are:                    Water, Juice (non-citric and without pulp), Carbonated beverages, Clear Tea, Black Coffee only, Plain Jell-O only, Gatorade and Plain Popsicles only    Take these medicines the morning of surgery with A SIP OF WATER xanax (if needed), diltiazem, hydrocodone (if needed), keppra, claritin, metoprolol, protonix, phenergan(if needed). Use your nebulizer and your inhaler before you come.  Take 6 units of your Lantus the night before your procedure. DO NOT take any diabetic medications the day of your procedure.    Do not wear jewelry, make-up or nail polish.  Do not wear lotions, powders, or perfumes, or deodorant.  Do not shave 48 hours prior to surgery.  Men may shave face and neck.  Do not bring valuables to the hospital.  [facility] is not responsible for any belongings or valuables.  Contacts, dentures or bridgework may not be worn into surgery.  Leave your suitcase in the car.  After surgery it may be brought to your room.  For patients admitted to the hospital, discharge time will be determined by your treatment team.  Patients discharged the day of surgery will not be allowed to drive home.   Name and phone number of your driver:   family Special instructions:  Follow the diet instructions given to you by Dr Roseanne Kaufman office.  Please read over the following fact sheets that you were given. Anesthesia Post-op Instructions and Care and Recovery After Surgery       Esophagogastroduodenoscopy Esophagogastroduodenoscopy (EGD) is a procedure to examine the lining of the esophagus,  stomach, and first part of the small intestine (duodenum). This procedure is done to check for problems such as inflammation, bleeding, ulcers, or growths. During this procedure, a long, flexible, lighted tube with a camera attached (endoscope) is inserted down the throat. Tell a health care provider about:  Any allergies you have.  All medicines you are taking, including vitamins, herbs, eye drops, creams, and over-the-counter medicines.  Any problems you or family members have had with anesthetic medicines.  Any blood disorders you have.  Any surgeries you have had.  Any medical conditions you have.  Whether you are pregnant or may be pregnant. What are the risks? Generally, this is a safe procedure. However, problems may occur, including:  Infection.  Bleeding.  A tear (perforation) in the esophagus, stomach, or duodenum.  Trouble breathing.  Excessive sweating.  Spasms of the larynx.  A slowed heartbeat.  Low blood pressure.  What happens before the procedure?  Follow instructions from your health care provider about eating or drinking restrictions.  Ask your health care provider about: ? Changing or stopping your regular medicines. This is especially important if you are taking diabetes medicines or blood thinners. ? Taking medicines such as aspirin and ibuprofen. These medicines can thin your blood. Do not take these medicines before your procedure if your health care provider instructs you not to.  Plan to have someone take you home after the procedure.  If you wear dentures, be ready to remove them before the procedure. What happens during the procedure?  To reduce your risk of infection, your health care team will wash or sanitize their hands.  An IV tube will be put in a vein in your hand or arm. You will get medicines and fluids through this tube.  You will be given one or more of the following: ? A medicine to help you relax (sedative). ? A medicine  to numb the area (local anesthetic). This medicine may be sprayed into your throat. It will make you feel more comfortable and keep you from gagging or coughing during the procedure. ? A medicine for pain.  A mouth guard may be placed in your mouth to protect your teeth and to keep you from biting on the endoscope.  You will be asked to lie on your left side.  The endoscope will be lowered down your throat into your esophagus, stomach, and duodenum.  Air will be put into the endoscope. This will help your health care provider see better.  The lining of your esophagus, stomach, and duodenum will be examined.  Your health care provider may: ? Take a tissue sample so it can be looked at in a lab (biopsy). ? Remove growths. ? Remove objects (foreign bodies) that are stuck. ? Treat any bleeding with medicines or other devices that stop tissue from bleeding. ? Widen (dilate) or stretch narrowed areas of your esophagus and stomach.  The endoscope will be taken out. The procedure may vary among health care providers and hospitals. What happens after the procedure?  Your blood pressure, heart rate, breathing rate, and blood oxygen level will be monitored often until the medicines you were given have worn off.  Do not eat or drink anything until the numbing medicine has worn off and your gag reflex has returned. This information is not intended to replace advice given to you by your health care provider. Make sure you discuss any questions you have with your health care provider. Document Released: 03/21/2005 Document Revised: 04/25/2016 Document Reviewed: 10/12/2015 Elsevier Interactive Patient Education  2018 [employer]. Esophagogastroduodenoscopy, Care After Refer to this sheet in the next few weeks. These instructions provide you with information about caring for yourself after your procedure. Your health care provider may also give you more specific instructions. Your treatment has been  planned according to current medical practices, but problems sometimes occur. Call your health care provider if you have any problems or questions after your procedure. What can I expect after the procedure? After the procedure, it is common to have:  A sore throat.  Nausea.  Bloating.  Dizziness.  Fatigue.  Follow these instructions at home:  Do not eat or drink anything until the numbing medicine (local anesthetic) has worn off and your gag reflex has returned. You will know that the local anesthetic has worn off when you can swallow comfortably.  Do not drive for 24 hours if you received a medicine to help you relax (sedative).  If your health care provider took a tissue sample for testing during the procedure, make sure to get your test results. This is your responsibility. Ask your health care provider or the department performing the test when your results will be ready.  Keep all follow-up visits as told by your health care provider. This is important. Contact a health care provider if:  You cannot stop coughing.  You are not urinating.  You are urinating less than usual. Get help right away if:  You have trouble swallowing.  You cannot eat or drink.  You have throat or chest pain that gets worse.  You are dizzy or light-headed.  You faint.  You have nausea or vomiting.  You have chills.  You have a fever.  You have severe abdominal pain.  You have black, tarry, or bloody stools. This information is not intended to replace advice given to you by your health care provider. Make sure you discuss any questions you have with your health care provider. Document Released: 11/04/2012 Document Revised: 04/25/2016 Document Reviewed: 10/12/2015 Elsevier Interactive Patient Education  2018 [employer].  Esophageal Dilatation Esophageal dilatation is a procedure to open a blocked or narrowed part of the esophagus. The esophagus is the long tube in your throat that  carries food and liquid from your mouth to your stomach. The procedure is also called esophageal dilation. You may need this procedure if you have a buildup of scar tissue in your esophagus that makes it difficult, painful, or even impossible to swallow. This can be caused by gastroesophageal reflux disease (GERD). In rare cases, people need this procedure because they have cancer of the esophagus or a problem with the way food moves through the esophagus. Sometimes you may need to have another dilatation to enlarge the opening of the esophagus gradually. Tell a health care provider about:  Any allergies you have.  All medicines you are taking, including vitamins, herbs, eye drops, creams, and over-the-counter medicines.  Any problems you or family members have had with anesthetic medicines.  Any blood disorders you have.  Any surgeries you have had.  Any medical conditions you have.  Any antibiotic medicines you are required to take before dental procedures. What are the risks? Generally, this is a safe procedure. However, problems can occur and include:  Bleeding from a tear in the lining of the esophagus.  A hole (perforation) in the esophagus.  What happens before the procedure?  Do not eat or drink anything after midnight on the night before the procedure or as directed by your health care provider.  Ask your health care provider about changing or stopping your regular medicines. This is especially important if you are taking diabetes medicines or blood thinners.  Plan to have someone take you home after the procedure. What happens during the procedure?  You will be given a medicine that makes you relaxed and sleepy (sedative).  A medicine may be sprayed or gargled to numb the back of the throat.  Your health care provider can use various instruments to do an esophageal dilatation. During the procedure, the instrument used will be placed in your mouth and passed down into  your esophagus. Options include: ? Simple dilators. This instrument is carefully placed in the esophagus to stretch it. ? Guided wire bougies. In this method, a flexible tube (endoscope) is used to insert a wire into the esophagus. The dilator is passed over this wire to enlarge the esophagus. Then the wire is removed. ? Balloon dilators. An endoscope with a small balloon at the end is passed down into the esophagus. Inflating the balloon gently stretches the esophagus and opens it up. What happens after the procedure?  Your blood pressure, heart rate, breathing rate, and blood oxygen level will be monitored often until the medicines you were given have worn off.  Your throat may feel slightly sore and will probably still  feel numb. This will improve slowly over time.  You will not be allowed to eat or drink until the throat numbness has resolved.  If this is a same-day procedure, you may be allowed to go home once you have been able to drink, urinate, and sit on the edge of the bed without nausea or dizziness.  If this is a same-day procedure, you should have a friend or family member with you for the next 24 hours after the procedure. This information is not intended to replace advice given to you by your health care provider. Make sure you discuss any questions you have with your health care provider. Document Released: 01/09/2006 Document Revised: 04/25/2016 Document Reviewed: 03/30/2014 Elsevier Interactive Patient Education  2018 [place] Anesthesia is a term that refers to techniques, procedures, and medicines that help a person stay safe and comfortable during a medical procedure. Monitored anesthesia care, or sedation, is one type of anesthesia. Your anesthesia specialist may recommend sedation if you will be having a procedure that does not require you to be unconscious, such as:  Cataract surgery.  A dental procedure.  A biopsy.  A  colonoscopy.  During the procedure, you may receive a medicine to help you relax (sedative). There are three levels of sedation:  Mild sedation. At this level, you may feel awake and relaxed. You will be able to follow directions.  Moderate sedation. At this level, you will be sleepy. You may not remember the procedure.  Deep sedation. At this level, you will be asleep. You will not remember the procedure.  The more medicine you are given, the deeper your level of sedation will be. Depending on how you respond to the procedure, the anesthesia specialist may change your level of sedation or the type of anesthesia to fit your needs. An anesthesia specialist will monitor you closely during the procedure. Let your health care provider know about:  Any allergies you have.  All medicines you are taking, including vitamins, herbs, eye drops, creams, and over-the-counter medicines.  Any use of steroids (by mouth or as a cream).  Any problems you or family members have had with sedatives and anesthetic medicines.  Any blood disorders you have.  Any surgeries you have had.  Any medical conditions you have, such as sleep apnea.  Whether you are pregnant or may be pregnant.  Any use of cigarettes, alcohol, or street drugs. What are the risks? Generally, this is a safe procedure. However, problems may occur, including:  Getting too much medicine (oversedation).  Nausea.  Allergic reaction to medicines.  Trouble breathing. If this happens, a breathing tube may be used to help with breathing. It will be removed when you are awake and breathing on your own.  Heart trouble.  Lung trouble.  Before the procedure Staying hydrated Follow instructions from your health care provider about hydration, which may include:  Up to 2 hours before the procedure - you may continue to drink clear liquids, such as water, clear fruit juice, black coffee, and plain tea.  Eating and drinking  restrictions Follow instructions from your health care provider about eating and drinking, which may include:  8 hours before the procedure - stop eating heavy meals or foods such as meat, fried foods, or fatty foods.  6 hours before the procedure - stop eating light meals or foods, such as toast or cereal.  6 hours before the procedure - stop drinking milk or drinks that contain milk.  2 hours before the procedure - stop drinking clear liquids.  Medicines Ask your health care provider about:  Changing or stopping your regular medicines. This is especially important if you are taking diabetes medicines or blood thinners.  Taking medicines such as aspirin and ibuprofen. These medicines can thin your blood. Do not take these medicines before your procedure if your health care provider instructs you not to.  Tests and exams  You will have a physical exam.  You may have blood tests done to show: ? How well your kidneys and liver are working. ? How well your blood can clot.  General instructions  Plan to have someone take you home from the hospital or clinic.  If you will be going home right after the procedure, plan to have someone with you for 24 hours.  What happens during the procedure?  Your blood pressure, heart rate, breathing, level of pain and overall condition will be monitored.  An IV tube will be inserted into one of your veins.  Your anesthesia specialist will give you medicines as needed to keep you comfortable during the procedure. This may mean changing the level of sedation.  The procedure will be performed. After the procedure  Your blood pressure, heart rate, breathing rate, and blood oxygen level will be monitored until the medicines you were given have worn off.  Do not drive for 24 hours if you received a sedative.  You may: ? Feel sleepy, clumsy, or nauseous. ? Feel forgetful about what happened after the procedure. ? Have a sore throat if you had a  breathing tube during the procedure. ? Vomit. This information is not intended to replace advice given to you by your health care provider. Make sure you discuss any questions you have with your health care provider. Document Released: 08/14/2005 Document Revised: 04/26/2016 Document Reviewed: 03/10/2016 Elsevier Interactive Patient Education  2018 [place], Care After These instructions provide you with information about caring for yourself after your procedure. Your health care provider may also give you more specific instructions. Your treatment has been planned according to current medical practices, but problems sometimes occur. Call your health care provider if you have any problems or questions after your procedure. What can I expect after the procedure? After your procedure, it is common to:  Feel sleepy for several hours.  Feel clumsy and have poor balance for several hours.  Feel forgetful about what happened after the procedure.  Have poor judgment for several hours.  Feel nauseous or vomit.  Have a sore throat if you had a breathing tube during the procedure.  Follow these instructions at home: For at least 24 hours after the procedure:   Do not: ? Participate in activities in which you could fall or become injured. ? Drive. ? Use heavy machinery. ? Drink alcohol. ? Take sleeping pills or medicines that cause drowsiness. ? Make important decisions or sign legal documents. ? Take care of children on your own.  Rest. Eating and drinking  Follow the diet that is recommended by your health care provider.  If you vomit, drink water, juice, or soup when you can drink without vomiting.  Make sure you have little or no nausea before eating solid foods. General instructions  Have a responsible adult stay with you until you are awake and alert.  Take over-the-counter and prescription medicines only as told by your health care  provider.  If you smoke, do not smoke without supervision.  Keep  all follow-up visits as told by your health care provider. This is important. Contact a health care provider if:  You keep feeling nauseous or you keep vomiting.  You feel light-headed.  You develop a rash.  You have a fever. Get help right away if:  You have trouble breathing. This information is not intended to replace advice given to you by your health care provider. Make sure you discuss any questions you have with your health care provider. Document Released: 03/10/2016 Document Revised: 07/10/2016 Document Reviewed: 03/10/2016 Elsevier Interactive Patient Education  [employer].

## 2018-07-07 ENCOUNTER — Encounter (HOSPITAL_COMMUNITY)
Admission: RE | Admit: 2018-07-07 | Discharge: 2018-07-07 | Disposition: A | Discharge status: Home / Self Care | Payer: Medicare Other | Source: Ambulatory Visit | Attending: Internal Medicine | Admitting: Internal Medicine

## 2018-07-07 ENCOUNTER — Encounter (HOSPITAL_COMMUNITY): Payer: Self-pay

## 2018-07-07 DIAGNOSIS — K219 Gastro-esophageal reflux disease without esophagitis: Secondary | ICD-10-CM | POA: Diagnosis not present

## 2018-07-07 DIAGNOSIS — R112 Nausea with vomiting, unspecified: Secondary | ICD-10-CM | POA: Diagnosis not present

## 2018-07-07 DIAGNOSIS — K449 Diaphragmatic hernia without obstruction or gangrene: Secondary | ICD-10-CM | POA: Insufficient documentation

## 2018-07-07 DIAGNOSIS — Z01818 Encounter for other preprocedural examination: Secondary | ICD-10-CM | POA: Diagnosis present

## 2018-07-07 LAB — BASIC METABOLIC PANEL
ANION GAP: 7 (ref 5–15)
BUN: 26 mg/dL — ABNORMAL HIGH (ref 8–23)
CALCIUM: 9.4 mg/dL (ref 8.9–10.3)
CO2: 27 mmol/L (ref 22–32)
Chloride: 105 mmol/L (ref 98–111)
Creatinine, Ser: 1.35 mg/dL — ABNORMAL HIGH (ref 0.44–1.00)
GFR, EST AFRICAN AMERICAN: 41 mL/min — AB (ref >60)
GFR, EST NON AFRICAN AMERICAN: 35 mL/min — AB (ref >60)
Glucose, Bld: 73 mg/dL (ref 70–99)
Potassium: 3.9 mmol/L (ref 3.5–5.1)
SODIUM: 139 mmol/L (ref 135–145)

## 2018-07-07 LAB — CBC
HCT: 35.2 % — ABNORMAL LOW (ref 36.0–46.0)
Hemoglobin: 10.9 g/dL — ABNORMAL LOW (ref 12.0–15.0)
MCH: 26 pg (ref 26.0–34.0)
MCHC: 31 g/dL (ref 30.0–36.0)
MCV: 84 fL (ref 78.0–100.0)
PLATELETS: 241 10*3/uL (ref 150–400)
RBC: 4.19 MIL/uL (ref 3.87–5.11)
RDW: 15.3 % (ref 11.5–15.5)
WBC: 7.1 10*3/uL (ref 4.0–10.5)

## 2018-07-07 NOTE — Progress Notes (Signed)
Creatinine is slightly up from baseline but overall within her trend. Hgb is 10.9, was 13 four months ago. Please send labs to PCP and ensure they know about her renal function. Continue with plans for endoscopic evaluation. Copying to Randall Hiss as FYI who saw her in clinic.

## 2018-07-13 ENCOUNTER — Ambulatory Visit (HOSPITAL_COMMUNITY): Payer: Medicare Other | Admitting: Anesthesiology

## 2018-07-13 ENCOUNTER — Ambulatory Visit (HOSPITAL_COMMUNITY)
Admission: RE | Admit: 2018-07-13 | Discharge: 2018-07-13 | Disposition: A | Discharge status: Home / Self Care | Payer: Medicare Other | Source: Ambulatory Visit | Attending: Internal Medicine | Admitting: Internal Medicine

## 2018-07-13 ENCOUNTER — Encounter (HOSPITAL_COMMUNITY)
Admission: RE | Disposition: A | Discharge status: Home / Self Care | Payer: Self-pay | Source: Ambulatory Visit | Attending: Internal Medicine

## 2018-07-13 ENCOUNTER — Encounter (HOSPITAL_COMMUNITY): Payer: Self-pay | Admitting: *Deleted

## 2018-07-13 DIAGNOSIS — R1310 Dysphagia, unspecified: Secondary | ICD-10-CM

## 2018-07-13 DIAGNOSIS — K219 Gastro-esophageal reflux disease without esophagitis: Secondary | ICD-10-CM | POA: Diagnosis not present

## 2018-07-13 DIAGNOSIS — R112 Nausea with vomiting, unspecified: Secondary | ICD-10-CM

## 2018-07-13 DIAGNOSIS — I10 Essential (primary) hypertension: Secondary | ICD-10-CM | POA: Diagnosis not present

## 2018-07-13 DIAGNOSIS — Z8673 Personal history of transient ischemic attack (TIA), and cerebral infarction without residual deficits: Secondary | ICD-10-CM | POA: Diagnosis not present

## 2018-07-13 DIAGNOSIS — Z7901 Long term (current) use of anticoagulants: Secondary | ICD-10-CM | POA: Diagnosis not present

## 2018-07-13 DIAGNOSIS — E119 Type 2 diabetes mellitus without complications: Secondary | ICD-10-CM | POA: Insufficient documentation

## 2018-07-13 DIAGNOSIS — E785 Hyperlipidemia, unspecified: Secondary | ICD-10-CM | POA: Insufficient documentation

## 2018-07-13 DIAGNOSIS — Z7984 Long term (current) use of oral hypoglycemic drugs: Secondary | ICD-10-CM | POA: Diagnosis not present

## 2018-07-13 DIAGNOSIS — R110 Nausea: Secondary | ICD-10-CM | POA: Diagnosis not present

## 2018-07-13 DIAGNOSIS — Z7951 Long term (current) use of inhaled steroids: Secondary | ICD-10-CM | POA: Diagnosis not present

## 2018-07-13 DIAGNOSIS — R569 Unspecified convulsions: Secondary | ICD-10-CM | POA: Diagnosis not present

## 2018-07-13 DIAGNOSIS — K449 Diaphragmatic hernia without obstruction or gangrene: Secondary | ICD-10-CM | POA: Insufficient documentation

## 2018-07-13 DIAGNOSIS — J449 Chronic obstructive pulmonary disease, unspecified: Secondary | ICD-10-CM | POA: Insufficient documentation

## 2018-07-13 DIAGNOSIS — R1314 Dysphagia, pharyngoesophageal phase: Secondary | ICD-10-CM | POA: Insufficient documentation

## 2018-07-13 HISTORY — PX: MALONEY DILATION: SHX5535

## 2018-07-13 HISTORY — PX: ESOPHAGOGASTRODUODENOSCOPY (EGD) WITH PROPOFOL: SHX5813

## 2018-07-13 LAB — GLUCOSE, CAPILLARY: Glucose-Capillary: 117 mg/dL — ABNORMAL HIGH (ref 70–99)

## 2018-07-13 SURGERY — ESOPHAGOGASTRODUODENOSCOPY (EGD) WITH PROPOFOL
Anesthesia: Monitor Anesthesia Care

## 2018-07-13 MED ORDER — LIDOCAINE HCL (PF) 1 % IJ SOLN
INTRAMUSCULAR | Status: DC | PRN
  Administered 2018-07-13: 20 mg

## 2018-07-13 MED ORDER — PROPOFOL 10 MG/ML IV BOLUS
INTRAVENOUS | Status: AC
  Filled 2018-07-13: qty 20

## 2018-07-13 MED ORDER — CHLORHEXIDINE GLUCONATE CLOTH 2 % EX PADS: 6.0000 | MEDICATED_PAD | Freq: Once | CUTANEOUS | Status: DC

## 2018-07-13 MED ORDER — LACTATED RINGERS IV SOLN
INTRAVENOUS | Status: DC
  Administered 2018-07-13: 1000 mL via INTRAVENOUS

## 2018-07-13 MED ORDER — PROPOFOL 10 MG/ML IV BOLUS
INTRAVENOUS | Status: DC | PRN
  Administered 2018-07-13 (×2): 50 mg via INTRAVENOUS

## 2018-07-13 NOTE — Anesthesia Postprocedure Evaluation (Signed)
Anesthesia Post Note  Patient: Sarah Duarte  Procedure(s) Performed: ESOPHAGOGASTRODUODENOSCOPY (EGD) WITH PROPOFOL (N/A ) MALONEY DILATION (N/A )  Patient location during evaluation: PACU Anesthesia Type: MAC Level of consciousness: awake, oriented and patient cooperative Pain management: pain level controlled Vital Signs Assessment: post-procedure vital signs reviewed and stable Respiratory status: spontaneous breathing, nonlabored ventilation and respiratory function stable Cardiovascular status: blood pressure returned to baseline and stable Postop Assessment: no headache, no backache and no apparent nausea or vomiting Anesthetic complications: no     Last Vitals:  Vitals:   07/13/18 1130 07/13/18 1150  BP:  (!) 172/72  Resp: (!) 27 18  Temp:  37 C  SpO2: 94% 96%    Last Pain:  Vitals:   07/13/18 1150  PainSc: 0-No pain                 Covey Baller E Diar Berkel

## 2018-07-13 NOTE — Anesthesia Preprocedure Evaluation (Signed)
Anesthesia Evaluation  Patient identified by MRN, date of birth, ID band Patient awake    Reviewed: Allergy & Precautions, H&P , NPO status , Patient's Chart, lab work & pertinent test results, reviewed documented beta blocker date and time   Airway Mallampati: II  TM Distance: >3 FB Neck ROM: full    Dental no notable dental hx.    Pulmonary neg pulmonary ROS, asthma , COPD,    Pulmonary exam normal breath sounds clear to auscultation       Cardiovascular Exercise Tolerance: Good hypertension, negative cardio ROS   Rhythm:regular Rate:Normal     Neuro/Psych Seizures -,  Anxiety CVA negative neurological ROS  negative psych ROS   GI/Hepatic negative GI ROS, Neg liver ROS, GERD  ,  Endo/Other  negative endocrine ROSdiabetes  Renal/GU Renal diseasenegative Renal ROS  negative genitourinary   Musculoskeletal   Abdominal   Peds  Hematology negative hematology ROS (+)   Anesthesia Other Findings   Reproductive/Obstetrics negative OB ROS                             Anesthesia Physical Anesthesia Plan  ASA: III  Anesthesia Plan: MAC   Post-op Pain Management:    Induction:   PONV Risk Score and Plan:   Airway Management Planned:   Additional Equipment:   Intra-op Plan:   Post-operative Plan:   Informed Consent: I have reviewed the patients History and Physical, chart, labs and discussed the procedure including the risks, benefits and alternatives for the proposed anesthesia with the patient or authorized representative who has indicated his/her understanding and acceptance.   Dental Advisory Given  Plan Discussed with: CRNA  Anesthesia Plan Comments:         Anesthesia Quick Evaluation

## 2018-07-13 NOTE — Op Note (Signed)
[facility] Patient Name: Sarah Duarte Procedure Date: 07/13/2018 11:10 AM MRN: [id number] Date of Birth: 1932-03-11 Attending MD: Norvel Richards , MD CSN: [id number] Age: 84 Admit Type: Outpatient Procedure:                Upper GI endoscopy Indications:              Dysphagia, Nausea Providers:                Norvel Richards, MD, Janeece Riggers, RN, Aram Candela Referring MD:             Glenda Chroman Medicines:                Propofol per Anesthesia Complications:            No immediate complications. Estimated Blood Loss:     Estimated blood loss: none. Procedure:                Pre-Anesthesia Assessment:                           - Prior to the procedure, a History and Physical                            was performed, and patient medications and                            allergies were reviewed. The patient's tolerance of                            previous anesthesia was also reviewed. The risks                            and benefits of the procedure and the sedation                            options and risks were discussed with the patient.                            All questions were answered, and informed consent                            was obtained. Prior Anticoagulants: The patient                            last took anticoagulant medication 1 day prior to                            the procedure. ASA Grade Assessment: III - A                            patient with severe systemic disease. After  reviewing the risks and benefits, the patient was                            deemed in satisfactory condition to undergo the                            procedure.                           After obtaining informed consent, the endoscope was                            passed under direct vision. Throughout the                            procedure, the patient's blood pressure, pulse, and             oxygen saturations were monitored continuously. The                            [id number]-[id number] ([id number]) was introduced through the and                            advanced to the second part of duodenum. The upper                            GI endoscopy was accomplished without difficulty.                            The patient tolerated the procedure well. Scope In: 11:38:41 AM Scope Out: 11:43:56 AM Total Procedure Duration: 0 hours 5 minutes 15 seconds  Findings:      The examined esophagus was normal.      A small hiatal hernia was present.      The exam was otherwise without abnormality.      The duodenal bulb and second portion of the duodenum were normal. The       scope was withdrawn. Dilation was performed with a Maloney dilator with       mild resistance at 77 Fr. The dilation site was examined following       endoscope reinsertion and showed no change. Estimated blood loss: none. Impression:               - Normal esophagus. Dilated.                           - Small hiatal hernia.                           - The examination was otherwise normal.                           - Normal duodenal bulb and second portion of the                            duodenum.                           -  No specimens collected. Moderate Sedation:      Moderate (conscious) sedation was personally administered by an       anesthesia professional. The following parameters were monitored: oxygen       saturation, heart rate, blood pressure, respiratory rate, EKG, adequacy       of pulmonary ventilation, and response to care. Total physician       intraservice time was 8 minutes. Recommendation:           - Patient has a contact number available for                            emergencies. The signs and symptoms of potential                            delayed complications were discussed with the                            patient. Return to normal activities tomorrow.                             Written discharge instructions were provided to the                            patient.                           - Resume previous diet.                           - Continue present medications. Since patient                            states symptoms have subsided pretty much                            spontaneously the past several weeks, Inot feel                            furthers warranted at this time. However, if she                            has recurrent problems, she is to let me know.                           - No repeat upper endoscopy.                           - Return to GI office PRN. Procedure Code(s):        --- Professional ---                           [phone], Esophagogastroduodenoscopy, flexible,                            transoral; diagnostic, including collection of  specimen(s) by brushing or washing, when performed                            (separate procedure)                           43450, Dilation of esophagus, by unguided sound or                            bougie, single or multiple passes Diagnosis Code(s):        --- Professional ---                           K44.9, Diaphragmatic hernia without obstruction or                            gangrene                           R13.10, Dysphagia, unspecified                           R11.0, Nausea CPT copyright 2017 American Medical Association. All rights reserved. The codes documented in this report are preliminary and upon coder review may  be revised to meet current compliance requirements. Cristopher Estimable. Rourk, MD Norvel Richards, MD 07/13/2018 11:52:43 AM This report has been signed electronically. Number of Addenda: 0

## 2018-07-13 NOTE — Transfer of Care (Signed)
Immediate Anesthesia Transfer of Care Note  Patient: Sarah Duarte  Procedure(s) Performed: ESOPHAGOGASTRODUODENOSCOPY (EGD) WITH PROPOFOL (N/A ) MALONEY DILATION (N/A )  Patient Location: PACU  Anesthesia Type:MAC  Level of Consciousness: awake, oriented and patient cooperative  Airway & Oxygen Therapy: Patient Spontanous Breathing  Post-op Assessment: Report given to RN and Post -op Vital signs reviewed and stable  Post vital signs: Reviewed and stable  Last Vitals:  Vitals Value Taken Time  BP 172/42 07/13/2018 11:51 AM  Temp    Pulse 57 07/13/2018 11:53 AM  Resp 27 07/13/2018 11:53 AM  SpO2 95 % 07/13/2018 11:53 AM  Vitals shown include unvalidated device data.  Last Pain: There were no vitals filed for this visit.       Complications: No apparent anesthesia complications

## 2018-07-13 NOTE — H&P (Signed)
@LOGO @   Primary Care Physician:  Glenda Chroman, MD Primary Gastroenterologist:  Dr. Gala Romney  Pre-Procedure History & Physical: HPI:  Sarah Duarte is a 84 y.o. female here for further evaluation of nausea and vague dysphagia. Since seen in the office in May, the symptoms have improved dramatically.  Past Medical History:  Diagnosis Date  . Adenomatous polyp    h/o  . Asthma   . Chronic nausea   . COPD (chronic obstructive pulmonary disease) ([place])   . Diabetes ([place])   . Disorientation, unspecified   . GERD (gastroesophageal reflux disease)   . Hemorrhoids    intermittently,bleeding  . Hyperlipidemia   . Hypertension   . Seizures ([place])   . Stroke [facility])     Past Surgical History:  Procedure Laterality Date  . BREAST BIOPSY     right,for benign disease  . COLONOSCOPY  07/30/2002   ZPH:XTAVWP rectum/Transverse left-sided diverticulum/The remainder of the colonic mucosa appeared normal  . COLONOSCOPY N/A 09/14/2014   Procedure: COLONOSCOPY;  Surgeon: Daneil Dolin, MD;  Location: [facility] END[facility] SUITE;  Service: Endoscopy;  Laterality: N/A;  8:30 AM - moved to 9:30 - Ginger to notify pt  . TONSILLECTOMY      Prior to Admission medications   Medication Sig Start Date End Date Taking? Authorizing Provider  acetaminophen (TYLENOL) 325 MG tablet Take 650 mg by mouth every 6 (six) hours as needed (for pain).   Yes [provider]  albuterol (ACCUNEB) 0.63 MG/3ML nebulizer solution Take 1 ampule by nebulization every 6 (six) hours as needed for wheezing.   Yes [provider]  ALPRAZolam (XANAX) 0.25 MG tablet Take 0.25 mg by mouth 2 (two) times daily as needed for anxiety.   Yes [provider]  budesonide-formoterol (SYMBICORT) 160-4.5 MCG/ACT inhaler Inhale 2 puffs into the lungs 2 (two) times daily. 02/11/18  Yes Juanito Doom, MD  Calcium Carbonate-Vitamin D (CALTRATE 600+D) 600-400 MG-UNIT per tablet Take 1 tablet by mouth 2 (two) times daily.   Yes  [provider]  diltiazem (CARDIZEM CD) 180 MG 24 hr capsule Take 1 capsule (180 mg total) by mouth daily. 01/19/18  Yes Herminio Commons, MD  donepezil (ARICEPT) 10 MG tablet Take 1 tablet (10 mg total) by mouth at bedtime. 01/10/18  Yes Rosalin Hawking, MD  ergocalciferol (VITAMIN D2) 50000 UNITS capsule Take 50,000 Units by mouth every 14 (fourteen) days.    Yes [provider]  glipiZIDE (GLUCOTROL) 5 MG tablet Take 1 tablet (5 mg total) by mouth daily before breakfast. 12/04/17  Yes Tat, Shanon Brow, MD  guaiFENesin-dextromethorphan (ROBITUSSIN DM) 100-10 MG/5ML syrup Take 5 mLs by mouth 2 (two) times daily as needed for cough.   Yes [provider]  HYDROcodone-acetaminophen (NORCO/VICODIN) 5-325 MG tablet Take 1 tablet by mouth every 8 (eight) hours as needed (for pain).    Yes [provider]  HYDROcodone-homatropine (HYCODAN) 5-1.5 MG/5ML syrup Take 5 mLs by mouth every 12 (twelve) hours as needed for cough.   Yes [provider]  LANTUS SOLOSTAR 100 UNIT/ML Solostar Pen Inject 12 Units into the skin daily.  02/11/18  Yes [provider]  levETIRAcetam (KEPPRA) 500 MG tablet Take 250 mg by mouth 2 (two) times daily.   Yes [provider]  loratadine (CLARITIN) 10 MG tablet Take 10 mg by mouth daily.   Yes [provider]  Melatonin 3 MG TABS Take 3 mg by mouth at bedtime.   Yes [provider]  metFORMIN (GLUCOPHAGE) 1000 MG tablet Take 1,000 mg by mouth 2 (two) times daily.   Yes [provider]  metoprolol tartrate (LOPRESSOR) 50 MG tablet Take 50 mg by mouth 2 (two) times daily.   Yes [provider]  pantoprazole (PROTONIX) 40 MG tablet Take 40 mg by mouth daily at 8 pm.  07/06/13  Yes [provider]  promethazine (PHENERGAN) 12.5 MG tablet Take 12.5 mg by mouth every 6 (six) hours as needed for nausea or vomiting.   Yes [provider]  ranitidine (ZANTAC) 300 MG tablet Take 300 mg  by mouth at bedtime.   Yes [provider]  Rivaroxaban (XARELTO) 15 MG TABS tablet Take 1 tablet (15 mg total) by mouth daily with supper. 12/04/17  Yes Tat, Shanon Brow, MD  rosuvastatin (CRESTOR) 5 MG tablet Take 2.5 mg by mouth daily.     Yes [provider]  sertraline (ZOLOFT) 50 MG tablet Take 50 mg by mouth daily.   Yes [provider]  traZODone (DESYREL) 50 MG tablet Take 50 mg by mouth at bedtime.  02/09/18  Yes [provider]  fluticasone (FLONASE) 50 MCG/ACT nasal spray Place 2 sprays into both nostrils daily. Patient not taking: Reported on 06/30/2018 02/25/18   Magdalen Spatz, NP  levETIRAcetam (KEPPRA) 250 MG tablet Take 1 tablet (250 mg total) by mouth 2 (two) times daily. Patient not taking: Reported on 06/30/2018 02/24/18   Isaac Bliss, Rayford Halsted, MD  metoprolol tartrate (LOPRESSOR) 25 MG tablet Take 1 tablet (25 mg total) by mouth 2 (two) times daily. Patient not taking: Reported on 06/30/2018 12/04/17   Orson Eva, MD  QUEtiapine (SEROQUEL) 25 MG tablet Take 0.5 tablets (12.5 mg total) by mouth at bedtime. Patient not taking: Reported on 06/30/2018 02/16/18   Rosalin Hawking, MD    Allergies as of 05/11/2018 - Review Complete 04/24/2018  Allergen Reaction Noted  . Aspirin  06/02/2012  . Fosamax [alendronate sodium]  12/02/2017  . Levaquin [levofloxacin]  12/02/2017  . Lipitor [atorvastatin]  12/02/2017  . Lisinopril  12/02/2017  . Reglan [metoclopramide] Other (See Comments) 06/02/2012    Family History  Problem Relation Age of Onset  . COPD Father   . Heart failure Mother   . Cirrhosis Brother        alcohol related cirrhosis  . Lung cancer Brother   . Colon cancer Neg Hx     Social History   Socioeconomic History  . Marital status: Widowed    Spouse name: Not on file  . Number of children: Not on file  . Years of education: Not on file  . Highest education level: Not on file  Occupational History  . Not on file  Social Needs  .  Financial resource strain: Not on file  . Food insecurity:    Worry: Not on file    Inability: Not on file  . Transportation needs:    Medical: Not on file    Non-medical: Not on file  Tobacco Use  . Smoking status: Never Smoker  . Smokeless tobacco: Never Used  Substance and Sexual Activity  . Alcohol use: No  . Drug use: No  . Sexual activity: Not on file  Lifestyle  . Physical activity:    Days per week: Not on file    Minutes per session: Not on file  . Stress: Not on file  Relationships  . Social connections:    Talks on phone: Not on file    Gets  together: Not on file    Attends religious service: Not on file    Active member of club or organization: Not on file    Attends meetings of clubs or organizations: Not on file    Relationship status: Not on file  . Intimate partner violence:    Fear of current or ex partner: Not on file    Emotionally abused: Not on file    Physically abused: Not on file    Forced sexual activity: Not on file  Other Topics Concern  . Not on file  Social History Narrative   Has 2 children and has been married for 50 years.    Retired from [employer].    Review of Systems: See HPI, otherwise negative ROS  Physical Exam: There were no vitals taken for this visit. General:   Alert,  Well-developed, well-nourished, pleasant and cooperative in NAD Skin:  Intact without significant lesions or rashes. Neck:  Supple; no masses or thyromegaly. No significant cervical adenopathy. Lungs:  Clear throughout to auscultation.   No wheezes, crackles, or rhonchi. No acute distress. Heart:  Regular rate and rhythm; no murmurs, clicks, rubs,  or gallops. Abdomen: Non-distended, normal bowel sounds.  Soft and nontender without appreciable mass or hepatosplenomegaly.  Pulses:  Normal pulses noted. Extremities:  Without clubbing or edema.  Impression/Plan:   84 year old lady with multiple medical problems here for further evaluation of nausea and vague  esophageal dysphagia.  EGD with possible ED as feasible/appropriate today per plan. The risks, benefits, limitations, alternatives and imponderables have been reviewed with the patient. Potential for esophageal dilation, biopsy, etc. have also been reviewed.  Questions have been answered. All parties agreeable.     Notice: This dictation was prepared with Dragon dictation along with smaller phrase technology. Any transcriptional errors that result from this process are unintentional and may not be corrected upon review.

## 2018-07-13 NOTE — Discharge Instructions (Addendum)
EGD Discharge instructions Please read the instructions outlined below and refer to this sheet in the next few weeks. These discharge instructions provide you with general information on caring for yourself after you leave the hospital. Your doctor may also give you specific instructions. While your treatment has been planned according to the most current medical practices available, unavoidable complications occasionally occur. If you have any problems or questions after discharge, please call your doctor. ACTIVITY  You may resume your regular activity but move at a slower pace for the next 24 hours.   Take frequent rest periods for the next 24 hours.   Walking will help expel (get rid of) the air and reduce the bloated feeling in your abdomen.   No driving for 24 hours (because of the anesthesia (medicine) used during the test).   You may shower.   Do not sign any important legal documents or operate any machinery for 24 hours (because of the anesthesia used during the test).  NUTRITION  Drink plenty of fluids.   You may resume your normal diet.   Begin with a light meal and progress to your normal diet.   Avoid alcoholic beverages for 24 hours or as instructed by your caregiver.  MEDICATIONS  You may resume your normal medications unless your caregiver tells you otherwise.  WHAT YOU CAN EXPECT TODAY  You may experience abdominal discomfort such as a feeling of fullness or gas pains.  FOLLOW-UP  Your doctor will discuss the results of your test with you.  SEEK IMMEDIATE MEDICAL ATTENTION IF ANY OF THE FOLLOWING OCCUR:  Excessive nausea (feeling sick to your stomach) and/or vomiting.   Severe abdominal pain and distention (swelling).   Trouble swallowing.   Temperature over 101 F (37.8 C).   Rectal bleeding or vomiting of blood.    GERD information provided  Continue Protonix 40 mg daily and ranitidine  Resume Xarelto tomorrow, 07/14/18, as prescribed.  If you  have any recurrent symptoms please let us know.  Gastroesophageal Reflux Disease, Adult Normally, food travels down the esophagus and stays in the stomach to be digested. However, when a person has gastroesophageal reflux disease (GERD), food and stomach acid move back up into the esophagus. When this happens, the esophagus becomes sore and inflamed. Over time, GERD can create small holes (ulcers) in the lining of the esophagus. What are the causes? This condition is caused by a problem with the muscle between the esophagus and the stomach (lower esophageal sphincter, or LES). Normally, the LES muscle closes after food passes through the esophagus to the stomach. When the LES is weakened or abnormal, it does not close properly, and that allows food and stomach acid to go back up into the esophagus. The LES can be weakened by certain dietary substances, medicines, and medical conditions, including:  Tobacco use.  Pregnancy.  Having a hiatal hernia.  Heavy alcohol use.  Certain foods and beverages, such as coffee, chocolate, onions, and peppermint.  What increases the risk? This condition is more likely to develop in:  People who have an increased body weight.  People who have connective tissue disorders.  People who use NSAID medicines.  What are the signs or symptoms? Symptoms of this condition include:  Heartburn.  Difficult or painful swallowing.  The feeling of having a lump in the throat.  Abitter taste in the mouth.  Bad breath.  Having a large amount of saliva.  Having an upset or bloated stomach.  Belching.  Chest pain.  Shortness of breath or wheezing.  Ongoing (chronic) cough or a night-time cough.  Wearing away of tooth enamel.  Weight loss.  Different conditions can cause chest pain. Make sure to see your health care provider if you experience chest pain. How is this diagnosed? Your health care provider will take a medical history and perform a  physical exam. To determine if you have mild or severe GERD, your health care provider may also monitor how you respond to treatment. You may also have other tests, including:  An endoscopy toexamine your stomach and esophagus with a small camera.  A test thatmeasures the acidity level in your esophagus.  A test thatmeasures how much pressure is on your esophagus.  A barium swallow or modified barium swallow to show the shape, size, and functioning of your esophagus.  How is this treated? The goal of treatment is to help relieve your symptoms and to prevent complications. Treatment for this condition may vary depending on how severe your symptoms are. Your health care provider may recommend:  Changes to your diet.  Medicine.  Surgery.  Follow these instructions at home: Diet  Follow a diet as recommended by your health care provider. This may involve avoiding foods and drinks such as: ? Coffee and tea (with or without caffeine). ? Drinks that containalcohol. ? Energy drinks and sports drinks. ? Carbonated drinks or sodas. ? Chocolate and cocoa. ? Peppermint and mint flavorings. ? Garlic and onions. ? Horseradish. ? Spicy and acidic foods, including peppers, chili powder, curry powder, vinegar, hot sauces, and barbecue sauce. ? Citrus fruit juices and citrus fruits, such as oranges, lemons, and limes. ? Tomato-based foods, such as red sauce, chili, salsa, and pizza with red sauce. ? Fried and fatty foods, such as donuts, french fries, potato chips, and high-fat dressings. ? High-fat meats, such as hot dogs and fatty cuts of red and white meats, such as rib eye steak, sausage, ham, and bacon. ? High-fat dairy items, such as whole milk, butter, and cream cheese.  Eat small, frequent meals instead of large meals.  Avoid drinking large amounts of liquid with your meals.  Avoid eating meals during the 2-3 hours before bedtime.  Avoid lying down right after you eat.  Do not  exercise right after you eat. General instructions  Pay attention to any changes in your symptoms.  Take over-the-counter and prescription medicines only as told by your health care provider. Do not take aspirin, ibuprofen, or other NSAIDs unless your health care provider told you to do so.  Do not use any tobacco products, including cigarettes, chewing tobacco, and e-cigarettes. If you need help quitting, ask your health care provider.  Wear loose-fitting clothing. Do not wear anything tight around your waist that causes pressure on your abdomen.  Raise (elevate) the head of your bed 6 inches (15cm).  Try to reduce your stress, such as with yoga or meditation. If you need help reducing stress, ask your health care provider.  If you are overweight, reduce your weight to an amount that is healthy for you. Ask your health care provider for guidance about a safe weight loss goal.  Keep all follow-up visits as told by your health care provider. This is important. Contact a health care provider if:  You have new symptoms.  You have unexplained weight loss.  You have difficulty swallowing, or it hurts to swallow.  You have wheezing or a persistent cough.  Your symptoms do not improve with treatment.  You  have a hoarse voice. Get help right away if:  You have pain in your arms, neck, jaw, teeth, or back.  You feel sweaty, dizzy, or light-headed.  You have chest pain or shortness of breath.  You vomit and your vomit looks like blood or coffee grounds.  You faint.  Your stool is bloody or black.  You cannot swallow, drink, or eat. This information is not intended to replace advice given to you by your health care provider. Make sure you discuss any questions you have with your health care provider. Document Released: 08/28/2005 Document Revised: 04/17/2016 Document Reviewed: 03/15/2015 Elsevier Interactive Patient Education  [employer].

## 2018-07-16 ENCOUNTER — Encounter (HOSPITAL_COMMUNITY): Payer: Self-pay | Admitting: Internal Medicine

## 2018-07-23 ENCOUNTER — Ambulatory Visit: Payer: Medicare Other | Admitting: Nurse Practitioner

## 2018-07-23 ENCOUNTER — Encounter: Payer: Self-pay | Admitting: Nurse Practitioner

## 2018-07-23 VITALS — BP 168/66 | HR 63 | Temp 97.0°F | Ht 65.0 in | Wt 148.2 lb

## 2018-07-23 DIAGNOSIS — K219 Gastro-esophageal reflux disease without esophagitis: Secondary | ICD-10-CM

## 2018-07-23 DIAGNOSIS — R05 Cough: Secondary | ICD-10-CM | POA: Diagnosis not present

## 2018-07-23 DIAGNOSIS — R053 Chronic cough: Secondary | ICD-10-CM

## 2018-07-23 MED ORDER — PANTOPRAZOLE SODIUM 40 MG PO TBEC: 40.0000 mg | DELAYED_RELEASE_TABLET | Freq: Two times a day (BID) | ORAL | 3 refills | Status: AC

## 2018-07-23 NOTE — Patient Instructions (Signed)
1. I have printed a prescription for you to increase Protonix to twice daily, 30 minutes before meal. 2. I have made the recommendation that speech-language pathology evaluate and treat you at [facility] for possible aspiration risk and cough. 3. Return for follow-up in 3 months. 4. Call us if you have any questions or concerns.  At [facility] we value your feedback. You may receive a survey about your visit today. Please share your experience as we strive to create trusting relationships with our patients to provide genuine, compassionate, quality care.  We appreciate your understanding and patience as we review any laboratory studies, imaging, and other diagnostic tests that are ordered as we care for you. Our office policy is 5 business days for review of these results, and any emergent or urgent results are addressed in a timely manner for your best interest. If you do not hear from our office in 1 week, please contact us.   We also encourage the use of MyChart, which contains your medical information for your review as well. If you are not enrolled in this feature, an access code is available on this after visit summary for your convenience. Thank you for allowing [place] to be involved in the care of you and your family.   It was great to see you today!  I hope you have a great summer!!

## 2018-07-23 NOTE — Assessment & Plan Note (Signed)
No classic GERD symptoms with the exception of occasional bitter/sour taste.  Denies abdominal pain, esophageal burning.  She does have a chronic cough, as per below.  Atypical/silent reflux could be responsible for her cough and we will increase her Protonix to twice daily to see if this helps.  Return for follow-up in 3 months.

## 2018-07-23 NOTE — Progress Notes (Signed)
Referring Provider: Glenda Chroman, MD Primary Care Physician:  Glenda Chroman, MD Primary GI:  Dr. Gala Romney  Chief Complaint  Patient presents with  . Gastroesophageal Reflux    f/u. C/o coughing spells, "had some relfux issues for a while"    HPI:   Sarah Duarte is a 84 y.o. female who presents for follow-up on nausea and vomiting. The patient was last seen in our office 04/24/2018 for GERD, nausea, vomiting.  Colonoscopy up-to-date 2015 and recommended no further colonoscopies unless symptoms develop.  At her last visit she noted vomiting, without nausea, postprandially about after every meal and feels better after vomiting.  This is been ongoing for 6 months.  She was admitted to the hospital November 2018 with pneumonia and this is when her symptoms began.  Noted subjective weight loss of 20 pounds since November, some "black looking stools" intermittently.  No significant diarrhea.  No other GI symptoms.  GERD is well controlled, no breakthrough.  Recommended upper endoscopy with possible dilation, change diet to mechanical soft with 6 small meals a day, follow-up in 3 months.  EGD was completed 07/13/2018 which found normal esophagus status post dilation, small hiatal hernia, otherwise normal.  Recommended resume previous diet, continue current medications, no repeat upper endoscopy, follow-up as needed.  Today she is accompanied by her son.  Today she states she's been having episodes of coughing. Has had a chronic cough for years, but this feels different. Occurs daily, typically after eating (per son), lasts up to a couple minutes; water sometimes helps. Denies esophageal burning, abdominal pain. Occasional bitter, sour taste, post-prandially after "junk foods." Denies N/V at this time. Denies seasonal allergies, recent illness (3-4 months). Denies hematochezia, melena, fever, chills. Has had 8 months of weight loss when she had a lot of N/V, currently her weight is up 2 lbs in the last 3  months since N/V resolved. No worsening asthma symptoms. Denies chest pain, dyspnea, dizziness, lightheadedness, syncope, near syncope. Denies any other upper or lower GI symptoms.  Has had "nightmares" occasionally where she will wake up and see someone standing right beside her. She tries to talk to them but they do not answer. When she tries to focus on them they disappear.  Past Medical History:  Diagnosis Date  . Adenomatous polyp    h/o  . Asthma   . Chronic nausea   . COPD (chronic obstructive pulmonary disease) ([place])   . Diabetes ([place])   . Disorientation, unspecified   . GERD (gastroesophageal reflux disease)   . Hemorrhoids    intermittently,bleeding  . Hyperlipidemia   . Hypertension   . Seizures ([place])   . Stroke [facility])     Past Surgical History:  Procedure Laterality Date  . BREAST BIOPSY     right,for benign disease  . COLONOSCOPY  07/30/2002   DEY:CXKGYJ rectum/Transverse left-sided diverticulum/The remainder of the colonic mucosa appeared normal  . COLONOSCOPY N/A 09/14/2014   Procedure: COLONOSCOPY;  Surgeon: Daneil Dolin, MD;  [facility];  Service: Endoscopy;  Laterality: N/A;  8:30 AM - moved to 9:30 - Ginger to notify pt  . ESOPHAGOGASTRODUODENOSCOPY (EGD) WITH PROPOFOL N/A 07/13/2018   Procedure: ESOPHAGOGASTRODUODENOSCOPY (EGD) WITH PROPOFOL;  Surgeon: Daneil Dolin, MD;  [facility];  Service: Endoscopy;  Laterality: N/A;  12:15pm  . MALONEY DILATION N/A 07/13/2018   Procedure: Venia Minks DILATION;  Surgeon: Daneil Dolin, MD;  [facility];  Service: Endoscopy;  Laterality:  N/A;  . TONSILLECTOMY      Current Outpatient Medications  Medication Sig Dispense Refill  . acetaminophen (TYLENOL) 325 MG tablet Take 650 mg by mouth every 6 (six) hours as needed (for pain).    Marland Kitchen albuterol (ACCUNEB) 0.63 MG/3ML nebulizer solution Take 1 ampule by nebulization every 6 (six) hours as needed for wheezing.    Marland Kitchen ALPRAZolam (XANAX)  0.25 MG tablet Take 0.25 mg by mouth 2 (two) times daily as needed for anxiety.    . budesonide-formoterol (SYMBICORT) 160-4.5 MCG/ACT inhaler Inhale 2 puffs into the lungs 2 (two) times daily. 1 Inhaler 6  . Calcium Carbonate-Vitamin D (CALTRATE 600+D) 600-400 MG-UNIT per tablet Take 1 tablet by mouth 2 (two) times daily.    Marland Kitchen diltiazem (CARDIZEM CD) 180 MG 24 hr capsule Take 1 capsule (180 mg total) by mouth daily. 30 capsule 6  . donepezil (ARICEPT) 10 MG tablet Take 1 tablet (10 mg total) by mouth at bedtime. 30 tablet 3  . ergocalciferol (VITAMIN D2) 50000 UNITS capsule Take 50,000 Units by mouth every 14 (fourteen) days.     Marland Kitchen glipiZIDE (GLUCOTROL) 5 MG tablet Take 1 tablet (5 mg total) by mouth daily before breakfast. 30 tablet 1  . guaiFENesin-dextromethorphan (ROBITUSSIN DM) 100-10 MG/5ML syrup Take 5 mLs by mouth 2 (two) times daily as needed for cough.    Marland Kitchen HYDROcodone-acetaminophen (NORCO/VICODIN) 5-325 MG tablet Take 1 tablet by mouth every 8 (eight) hours as needed (for pain).     Marland Kitchen HYDROcodone-homatropine (HYCODAN) 5-1.5 MG/5ML syrup Take 5 mLs by mouth every 12 (twelve) hours as needed for cough.    Marland Kitchen LANTUS SOLOSTAR 100 UNIT/ML Solostar Pen Inject 12 Units into the skin daily.   10  . levETIRAcetam (KEPPRA) 500 MG tablet Take 250 mg by mouth 2 (two) times daily.    Marland Kitchen loratadine (CLARITIN) 10 MG tablet Take 10 mg by mouth daily.    . Melatonin 3 MG TABS Take 3 mg by mouth at bedtime.    . metFORMIN (GLUCOPHAGE) 1000 MG tablet Take 1,000 mg by mouth 2 (two) times daily.    . metoprolol tartrate (LOPRESSOR) 50 MG tablet Take 50 mg by mouth 2 (two) times daily.    . pantoprazole (PROTONIX) 40 MG tablet Take 40 mg by mouth daily at 8 pm.     . promethazine (PHENERGAN) 12.5 MG tablet Take 12.5 mg by mouth every 6 (six) hours as needed for nausea or vomiting.    . ranitidine (ZANTAC) 300 MG tablet Take 300 mg by mouth at bedtime.    . Rivaroxaban (XARELTO) 15 MG TABS tablet Take 1 tablet  (15 mg total) by mouth daily with supper. 30 tablet 0  . rosuvastatin (CRESTOR) 5 MG tablet Take 2.5 mg by mouth daily.      . sertraline (ZOLOFT) 50 MG tablet Take 50 mg by mouth daily.    . traZODone (DESYREL) 50 MG tablet Take 50 mg by mouth at bedtime.   10   No current facility-administered medications for this visit.     Allergies as of 07/23/2018 - Review Complete 07/23/2018  Allergen Reaction Noted  . Aspirin  06/02/2012  . Fosamax [alendronate sodium]  12/02/2017  . Levaquin [levofloxacin]  12/02/2017  . Lipitor [atorvastatin]  12/02/2017  . Lisinopril  12/02/2017  . Reglan [metoclopramide] Other (See Comments) 06/02/2012    Family History  Problem Relation Age of Onset  . COPD Father   . Heart failure Mother   . Cirrhosis Brother  alcohol related cirrhosis  . Lung cancer Brother   . Colon cancer Neg Hx     Social History   Socioeconomic History  . Marital status: Widowed    Spouse name: Not on file  . Number of children: Not on file  . Years of education: Not on file  . Highest education level: Not on file  Occupational History  . Not on file  Social Needs  . Financial resource strain: Not on file  . Food insecurity:    Worry: Not on file    Inability: Not on file  . Transportation needs:    Medical: Not on file    Non-medical: Not on file  Tobacco Use  . Smoking status: Never Smoker  . Smokeless tobacco: Never Used  Substance and Sexual Activity  . Alcohol use: No  . Drug use: No  . Sexual activity: Not on file  Lifestyle  . Physical activity:    Days per week: Not on file    Minutes per session: Not on file  . Stress: Not on file  Relationships  . Social connections:    Talks on phone: Not on file    Gets together: Not on file    Attends religious service: Not on file    Active member of club or organization: Not on file    Attends meetings of clubs or organizations: Not on file    Relationship status: Not on file  Other Topics  Concern  . Not on file  Social History Narrative   Has 2 children and has been married for 50 years.    Retired from [employer].    Review of Systems: General: Negative for anorexia, weight loss, fever, chills, fatigue, weakness. Eyes: Negative for vision changes.  ENT: Negative for hoarseness, difficulty swallowing , nasal congestion. CV: Negative for chest pain, angina, palpitations, dyspnea on exertion, peripheral edema.  Respiratory: Negative for dyspnea at rest, dyspnea on exertion, cough, sputum, wheezing.  GI: See history of present illness. GU:  Negative for dysuria, hematuria, urinary incontinence, urinary frequency, nocturnal urination.  MS: Negative for joint pain, low back pain.  Derm: Negative for rash or itching.  Neuro: Negative for weakness, abnormal sensation, seizure, frequent headaches, memory loss, confusion.  Psych: Negative for anxiety, depression, suicidal ideation, hallucinations.  Endo: Negative for unusual weight change.  Heme: Negative for bruising or bleeding. Allergy: Negative for rash or hives.   Physical Exam: BP (!) 168/66   Pulse 63   Temp (!) 97 F (36.1 C) (Oral)   Ht 5\' 5"  (1.651 m)   Wt 148 lb 3.2 oz (67.2 kg)   BMI 24.66 kg/m  General:   Alert and oriented. Pleasant and cooperative. Well-nourished and well-developed.  Head:  Normocephalic and atraumatic. Eyes:  Without icterus, sclera clear and conjunctiva pink.  Ears:  Normal auditory acuity. Mouth:  No deformity or lesions, oral mucosa pink.  Throat/Neck:  Supple, without mass or thyromegaly. Cardiovascular:  S1, S2 present without murmurs appreciated. Normal pulses noted. Extremities without clubbing or edema. Respiratory:  Clear to auscultation bilaterally. No wheezes, rales, or rhonchi. No distress.  Gastrointestinal:  +BS, soft, non-tender and non-distended. No HSM noted. No guarding or rebound. No masses appreciated.  Rectal:  Deferred  Musculoskalatal:  Symmetrical without gross  deformities. Normal posture. Skin:  Intact without significant lesions or rashes. Neurologic:  Alert and oriented x4;  grossly normal neurologically. Psych:  Alert and cooperative. Normal mood and affect. Heme/Lymph/Immune: No significant cervical adenopathy. No excessive  bruising noted.    07/23/2018 2:35 PM   Disclaimer: This note was dictated with voice recognition software. Similar sounding words can inadvertently be transcribed and may not be corrected upon review.

## 2018-07-23 NOTE — Assessment & Plan Note (Signed)
Noted history of chronic cough.  However, she states recently her cough is been "different".  Seems like it is an exacerbation.  It is described as dry and hacking, typically last a couple minutes per spell and sometimes is improved with drinking water.  Her son notes it is often after eating.  There are multiple possible etiologies behind her chronic cough.  She does have a history of asthma but states her other asthma symptoms are well controlled, and unchanged.  She also has atrial fibrillation but there is no lower extremity edema or other symptoms to indicate worsening.  Additionally, her lungs generally sound clear.  She is not on an ACE inhibitor or ARB.  She denies seasonal allergies, but is on Claritin.  Regardless, no other symptoms to indicate exacerbation of allergies.  Most likely causes in the differentials include worsening reflux and a silent/atypical pattern.  She could also be having risk for aspiration.  I will increase her Protonix to twice daily as per above.  She saw speech-language pathology in the hospital a few months ago who deemed her a mild aspiration risk and recommended follow-up sessions.  She has not had these.  I will recommend a speech-language pathology evaluation and treatment at the facility to assess for aspiration risk and teaching to prevent aspiration.  Return for follow-up in 3 months.

## 2018-07-24 ENCOUNTER — Encounter: Payer: Self-pay | Admitting: Nurse Practitioner

## 2018-07-24 NOTE — Progress Notes (Signed)
CC'ED TO PCP 

## 2018-08-07 ENCOUNTER — Ambulatory Visit: Payer: Medicare Other | Admitting: Internal Medicine

## 2018-11-10 ENCOUNTER — Ambulatory Visit: Payer: Medicare Other | Admitting: Nurse Practitioner

## 2019-01-01 ENCOUNTER — Encounter: Payer: Medicare Other | Admitting: Internal Medicine

## 2019-01-01 ENCOUNTER — Encounter: Payer: Self-pay | Admitting: Internal Medicine

## 2019-01-01 ENCOUNTER — Ambulatory Visit (INDEPENDENT_AMBULATORY_CARE_PROVIDER_SITE_OTHER): Payer: Medicare Other | Admitting: Internal Medicine

## 2019-01-01 VITALS — BP 162/72 | HR 66 | Ht 65.0 in | Wt 146.0 lb

## 2019-01-01 DIAGNOSIS — I4819 Other persistent atrial fibrillation: Secondary | ICD-10-CM

## 2019-01-01 DIAGNOSIS — I10 Essential (primary) hypertension: Secondary | ICD-10-CM | POA: Diagnosis not present

## 2019-01-01 NOTE — Progress Notes (Signed)
Electrophysiology Office Note   Date:  01/01/2019   ID:  Sarah Duarte, DOB [birth year], MRN [id number]  PCP:  Glenda Chroman, MD  Cardiologist:  Dr Bronson Ing Primary Electrophysiologist: Thompson Grayer, MD    CC: afib   History of Present Illness: Sarah Duarte is a 85 y.o. female who presents today for electrophysiology evaluation.   She has as h/o COPD and prior stroke.  She also has afib for which she is followed by Dr Bronson Ing (his notes reviewed).  She presents today for routine cardiology follow-up.  I have not see her previously.  She is currently doing well.  She lives at [place].  She is unaware of afib since last year.  Today, she denies symptoms of palpitations, chest pain, shortness of breath, orthopnea, PND, lower extremity edema, claudication, dizziness, presyncope, syncope, bleeding, or neurologic sequela. The patient is tolerating medications without difficulties and is otherwise without complaint today.    Past Medical History:  Diagnosis Date  . Adenomatous polyp    h/o  . Asthma   . Chronic nausea   . COPD (chronic obstructive pulmonary disease) ([place])   . Diabetes ([place])   . Disorientation, unspecified   . GERD (gastroesophageal reflux disease)   . Hemorrhoids    intermittently,bleeding  . Hyperlipidemia   . Hypertension   . Seizures ([place])   . Stroke [facility])    Past Surgical History:  Procedure Laterality Date  . BREAST BIOPSY     right,for benign disease  . COLONOSCOPY  07/30/2002   WJX:[id number] rectum/Transverse left-sided diverticulum/The remainder of the colonic mucosa appeared normal  . COLONOSCOPY N/A 09/14/2014   Procedure: COLONOSCOPY;  Surgeon: Daneil Dolin, MD;  [facility];  Service: Endoscopy;  Laterality: N/A;  8:30 AM - moved to 9:30 - Ginger to notify pt  . ESOPHAGOGASTRODUODENOSCOPY (EGD) WITH PROPOFOL N/A 07/13/2018   Procedure: ESOPHAGOGASTRODUODENOSCOPY (EGD) WITH PROPOFOL;  Surgeon: Daneil Dolin, MD;  [facility];  Service: Endoscopy;  Laterality: N/A;  12:15pm  . MALONEY DILATION N/A 07/13/2018   Procedure: Venia Minks DILATION;  Surgeon: Daneil Dolin, MD;  [facility];  Service: Endoscopy;  Laterality: N/A;  . TONSILLECTOMY       Current Outpatient Medications  Medication Sig Dispense Refill  . acetaminophen (TYLENOL) 325 MG tablet Take 650 mg by mouth every 6 (six) hours as needed (for pain).    Marland Kitchen albuterol (ACCUNEB) 0.63 MG/3ML nebulizer solution Take 1 ampule by nebulization every 6 (six) hours as needed for wheezing.    Marland Kitchen ALPRAZolam (XANAX) 0.25 MG tablet Take 0.25 mg by mouth 2 (two) times daily as needed for anxiety.    . budesonide-formoterol (SYMBICORT) 160-4.5 MCG/ACT inhaler Inhale 2 puffs into the lungs 2 (two) times daily. 1 Inhaler 6  . Calcium Carbonate-Vitamin D (CALTRATE 600+D) 600-400 MG-UNIT per tablet Take 1 tablet by mouth 2 (two) times daily.    Marland Kitchen diltiazem (CARDIZEM CD) 180 MG 24 hr capsule Take 1 capsule (180 mg total) by mouth daily. 30 capsule 6  . donepezil (ARICEPT) 10 MG tablet Take 1 tablet (10 mg total) by mouth at bedtime. 30 tablet 3  . ergocalciferol (VITAMIN D2) 50000 UNITS capsule Take 50,000 Units by mouth every 14 (fourteen) days.     Marland Kitchen glipiZIDE (GLUCOTROL) 5 MG tablet Take 1 tablet (5 mg total) by mouth daily before breakfast. 30 tablet 1  . guaiFENesin-dextromethorphan (ROBITUSSIN DM) 100-10 MG/5ML syrup Take 5 mLs by mouth 2 (  two) times daily as needed for cough.    Marland Kitchen HYDROcodone-acetaminophen (NORCO/VICODIN) 5-325 MG tablet Take 1 tablet by mouth every 8 (eight) hours as needed (for pain).     Marland Kitchen HYDROcodone-homatropine (HYCODAN) 5-1.5 MG/5ML syrup Take 5 mLs by mouth every 12 (twelve) hours as needed for cough.    Marland Kitchen LANTUS SOLOSTAR 100 UNIT/ML Solostar Pen Inject 12 Units into the skin daily.   10  . levETIRAcetam (KEPPRA) 500 MG tablet Take 250 mg by mouth 2 (two) times daily.    Marland Kitchen loratadine (CLARITIN) 10 MG tablet Take 10 mg by mouth  daily.    . Melatonin 3 MG TABS Take 3 mg by mouth at bedtime.    . metFORMIN (GLUCOPHAGE) 1000 MG tablet Take 1,000 mg by mouth 2 (two) times daily.    . metoprolol tartrate (LOPRESSOR) 50 MG tablet Take 50 mg by mouth 2 (two) times daily.    . pantoprazole (PROTONIX) 40 MG tablet Take 1 tablet (40 mg total) by mouth 2 (two) times daily before a meal. 120 tablet 3  . promethazine (PHENERGAN) 12.5 MG tablet Take 12.5 mg by mouth every 6 (six) hours as needed for nausea or vomiting.    . ranitidine (ZANTAC) 300 MG tablet Take 300 mg by mouth at bedtime.    . Rivaroxaban (XARELTO) 15 MG TABS tablet Take 1 tablet (15 mg total) by mouth daily with supper. 30 tablet 0  . rosuvastatin (CRESTOR) 5 MG tablet Take 2.5 mg by mouth daily.      . sertraline (ZOLOFT) 50 MG tablet Take 50 mg by mouth daily.    . traZODone (DESYREL) 50 MG tablet Take 50 mg by mouth at bedtime.   10   No current facility-administered medications for this visit.     Allergies:   Aspirin; Fosamax [alendronate sodium]; Levaquin [levofloxacin]; Lipitor [atorvastatin]; Lisinopril; and Reglan [metoclopramide]   Social History:  The patient  reports that she has never smoked. She has never used smokeless tobacco. She reports that she does not drink alcohol or use drugs.   Family History:  The patient's  family history includes COPD in her father; Cirrhosis in her brother; Heart failure in her mother; Lung cancer in her brother.    ROS:  Please see the history of present illness.   All other systems are personally reviewed and negative.    PHYSICAL EXAM: VS:  BP (!) 162/72   Pulse 66   Ht 5\' 5"  (1.651 m)   Wt 146 lb (66.2 kg)   SpO2 98%   BMI 24.30 kg/m  , BMI Body mass index is 24.3 kg/m. GEN: Well nourished, well developed, in no acute distress  HEENT: normal  Neck: no JVD, carotid bruits, or masses Cardiac: RRR; no murmurs, rubs, or gallops,no edema  Respiratory:  clear to auscultation bilaterally, normal work of  breathing GI: soft, nontender, nondistended, + BS MS: no deformity or atrophy  Skin: warm and dry  Neuro:  Some dysarthria is noted Psych: euthymic mood, full affect  EKG:  EKG is ordered today. The ekg ordered today is personally reviewed and shows sinus with RBBB   Recent Labs: 02/20/2018: ALT 12 07/07/2018: BUN 26; Creatinine, Ser 1.35; Hemoglobin 10.9; Platelets 241; Potassium 3.9; Sodium 139  personally reviewed   Lipid Panel     Component Value Date/Time   CHOL 129 02/21/2018 0557   TRIG 88 02/21/2018 0557   HDL 37 (L) 02/21/2018 0557   CHOLHDL 3.5 02/21/2018 0557   VLDL  18 02/21/2018 0557   LDLCALC 74 02/21/2018 0557   personally reviewed   Wt Readings from Last 3 Encounters:  01/01/19 146 lb (66.2 kg)  07/23/18 148 lb 3.2 oz (67.2 kg)  07/07/18 146 lb (66.2 kg)     Other studies personally reviewed: Additional studies/ records that were reviewed today include: Dr Court Joy notes  Review of the above records today demonstrates: as above   ASSESSMENT AND PLAN:  1.  afib Well controlled On xarelto  2. HTN Elevated Consider losartan 25mg  daily if not contraindicated (I have sent a note to [facility])  3. Prior history of VT EF normal I do not have arrhythmia to review asymptomatic  Follow-up:  With Dr Bronson Ing in 6 months I will see as needed  Current medicines are reviewed at length with the patient today.   The patient does not have concerns regarding her medicines.  The following changes were made today:  none  Labs/ tests ordered today include:  Orders Placed This Encounter  Procedures  . EKG 12-Lead     Signed, Thompson Grayer, MD  01/01/2019 12:06 PM     [place] [place] [address] [phone] (office) [phone] (fax)

## 2019-01-01 NOTE — Patient Instructions (Addendum)
Medication Instructions:   Your physician recommends that you continue on your current medications as directed. Please refer to the Current Medication list given to you today.  Labwork:  NOONE  Testing/Procedures:  NONE  Follow-Up:  Your physician recommends that you schedule a follow-up appointment in: 6 months. You will receive a reminder letter in the mail in about 4 months reminding you to call and schedule your appointment. If you don't receive this letter, please contact our office.  Any Other Special Instructions Will Be Listed Below (If Applicable).  If you need a refill on your cardiac medications before your next appointment, please call your pharmacy.

## 2019-01-27 ENCOUNTER — Ambulatory Visit: Payer: Medicare Other | Admitting: Nurse Practitioner

## 2019-10-30 IMAGING — DX DG CHEST 2V
2 series · 2 of 2 positions shown · non-contrast
Comparison: 11/21/2017

CLINICAL DATA: Chest pain

EXAM:
CHEST  2 VIEW

[chest pa]
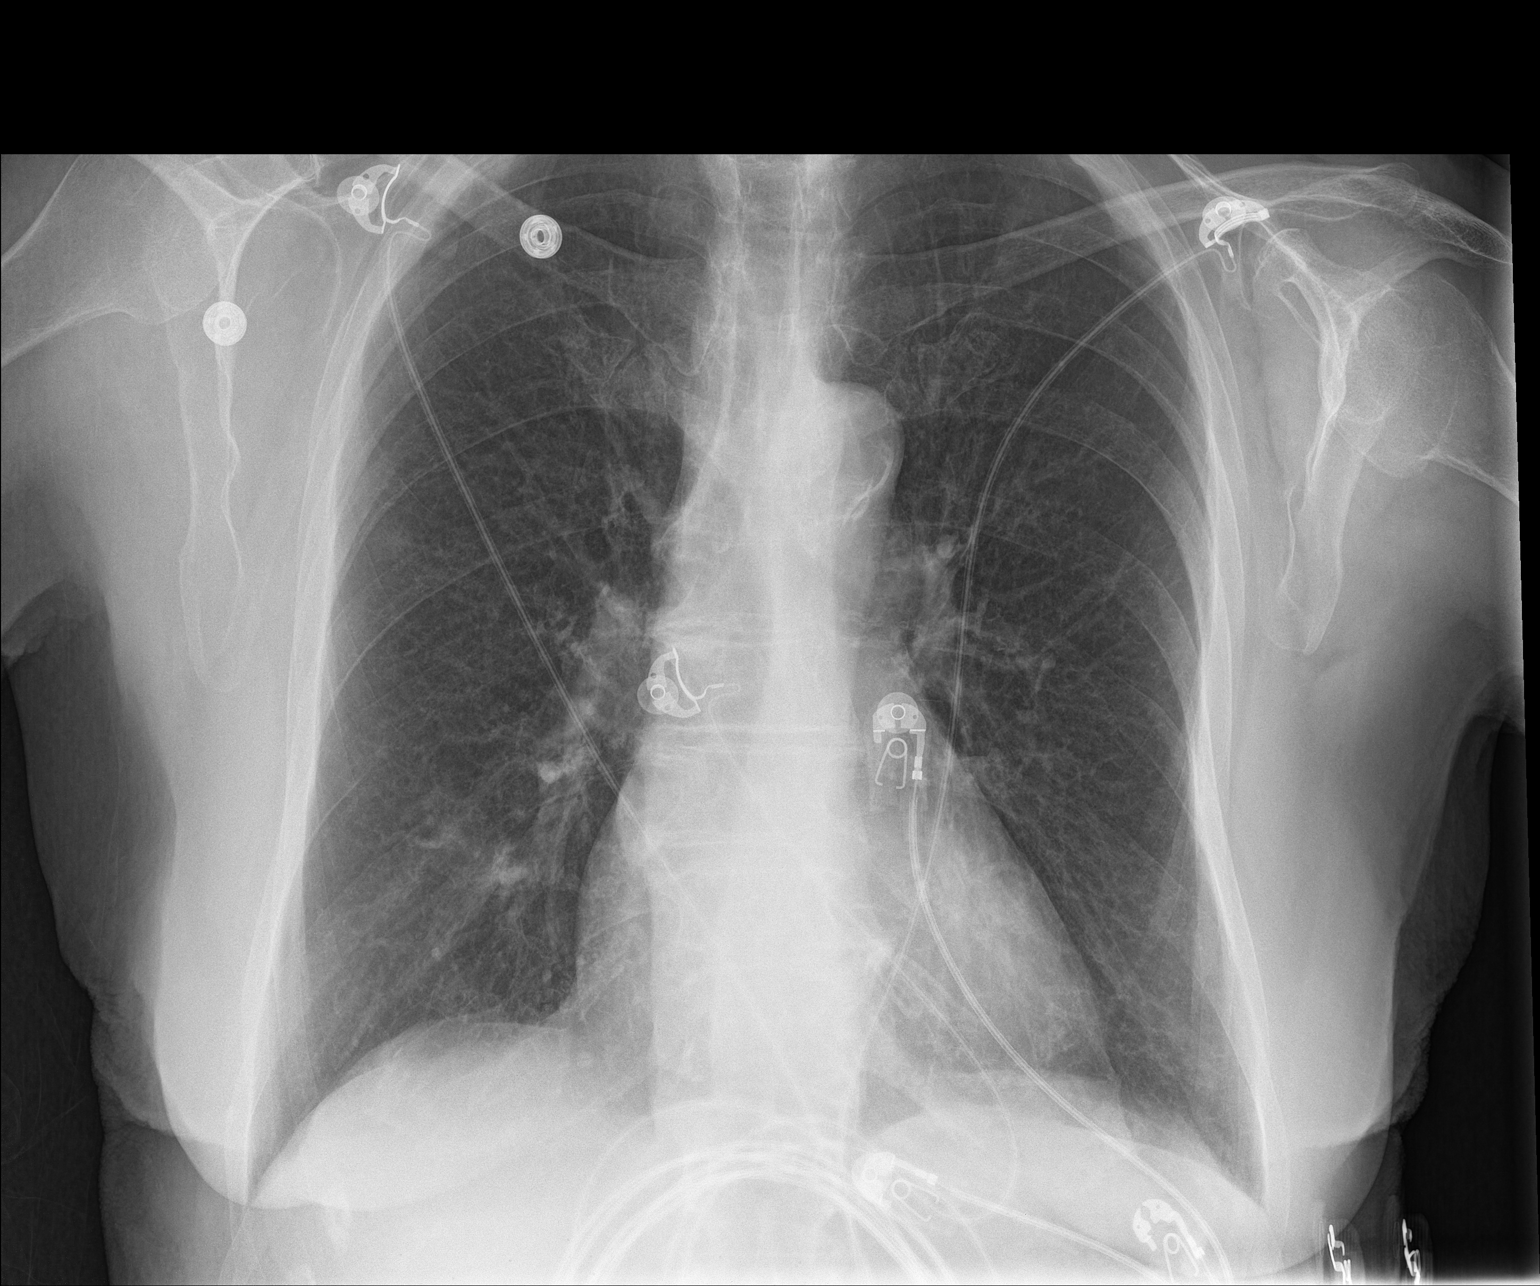

[chest lat]
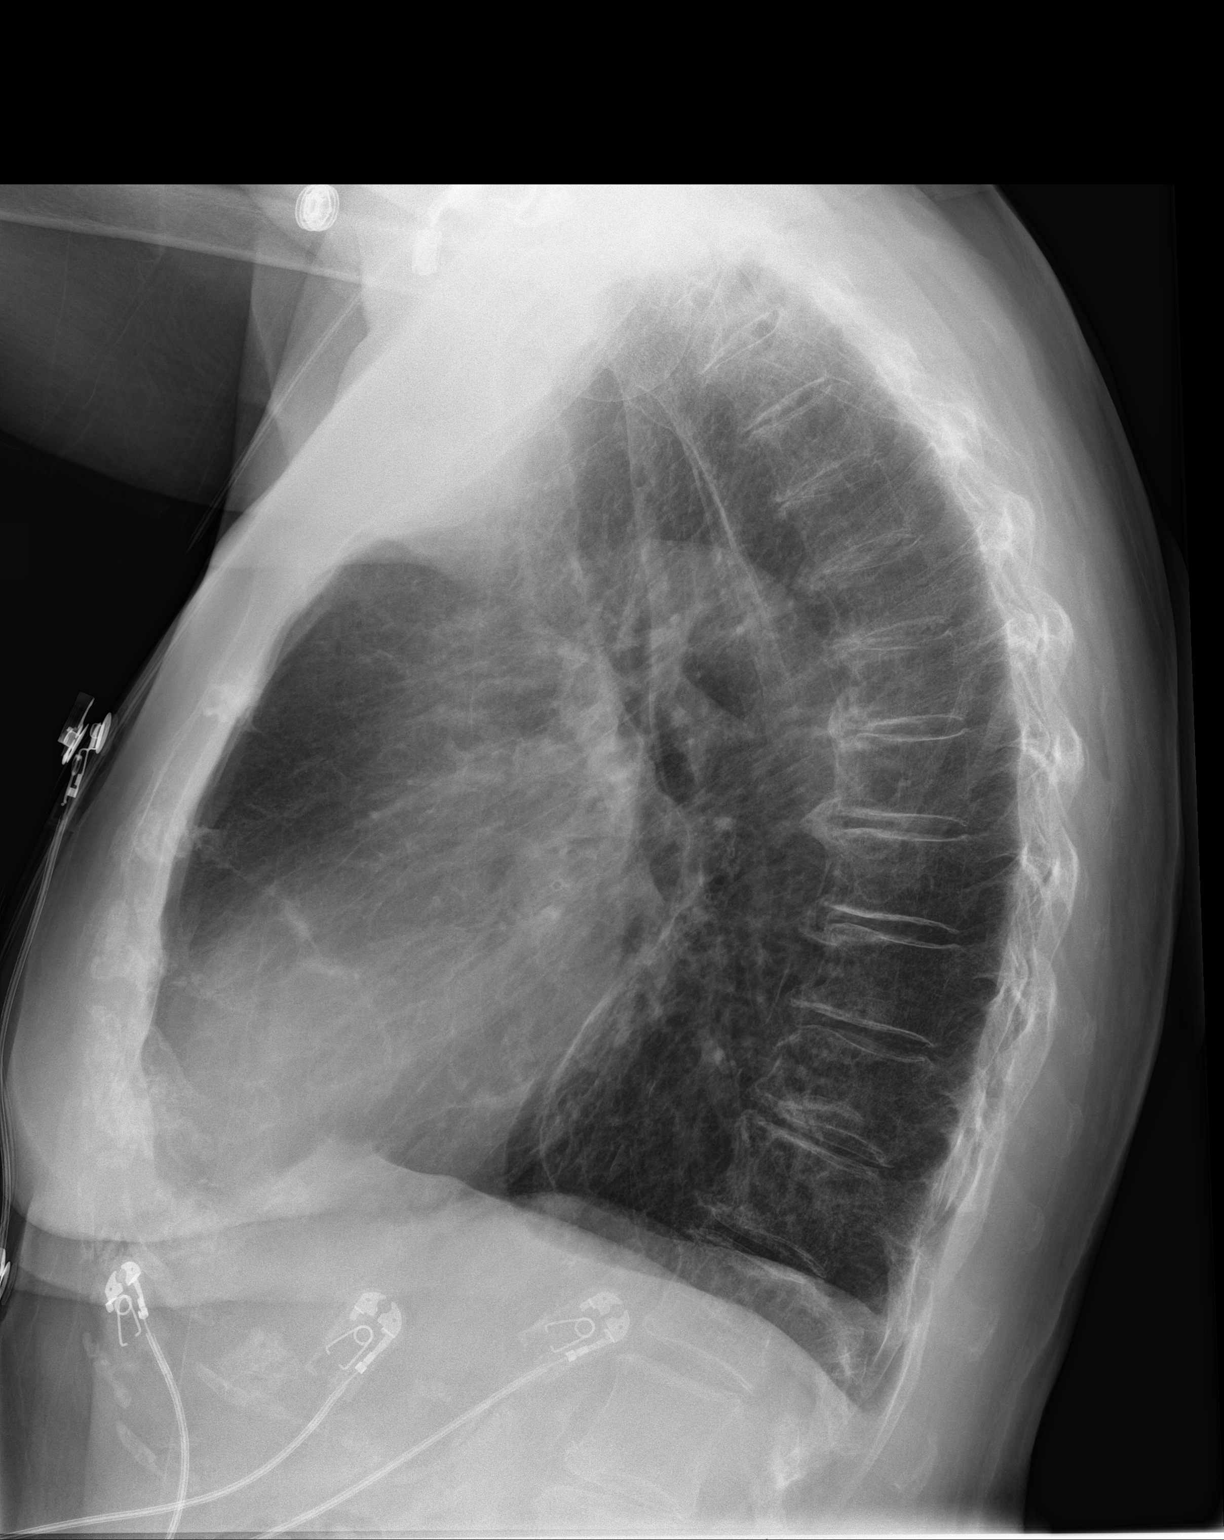

[2 of 2 positions shown; findings below may reference images not displayed]

FINDINGS: Normal heart size and mediastinal contours. Atherosclerotic
calcification. There is no edema, consolidation, effusion, or
pneumothorax. Mild lung scarring or atelectasis. Subtle density over
the anterior right third rib that is not convincing for nodule. No
acute osseous finding. Lung volumes are large.
IMPRESSION: No evidence of active disease.

## 2020-04-01 DEATH — deceased
